# Patient Record
Sex: Male | Born: 1945 | Race: Black or African American | Hispanic: No | Smoking: Current some day smoker
Health system: Southern US, Community
[De-identification: ages and names within clinical notes are randomized; demographics above are authoritative.]

## PROBLEM LIST (undated history)

## (undated) DIAGNOSIS — Z720 Tobacco use: Secondary | ICD-10-CM

## (undated) DIAGNOSIS — Z789 Other specified health status: Secondary | ICD-10-CM

## (undated) DIAGNOSIS — I10 Essential (primary) hypertension: Secondary | ICD-10-CM

## (undated) DIAGNOSIS — C189 Malignant neoplasm of colon, unspecified: Secondary | ICD-10-CM

## (undated) HISTORY — DX: Essential (primary) hypertension: I10

## (undated) HISTORY — DX: Tobacco use: Z72.0

## (undated) SURGERY — COLONOSCOPY WITH PROPOFOL
Anesthesia: Monitor Anesthesia Care | Laterality: Left

---

## 1981-04-02 HISTORY — PX: BACK SURGERY: SHX140

## 2015-05-20 ENCOUNTER — Encounter (HOSPITAL_COMMUNITY): Payer: Self-pay | Admitting: Emergency Medicine

## 2015-05-20 ENCOUNTER — Inpatient Hospital Stay (HOSPITAL_COMMUNITY)
Admission: EM | Admit: 2015-05-20 | Discharge: 2015-05-31 | DRG: 329 | Disposition: A | Discharge status: Home / Self Care | Payer: Medicare Other | Attending: General Surgery | Admitting: General Surgery

## 2015-05-20 ENCOUNTER — Emergency Department (HOSPITAL_COMMUNITY): Payer: Medicare Other

## 2015-05-20 DIAGNOSIS — E860 Dehydration: Secondary | ICD-10-CM | POA: Diagnosis present

## 2015-05-20 DIAGNOSIS — D126 Benign neoplasm of colon, unspecified: Secondary | ICD-10-CM | POA: Diagnosis present

## 2015-05-20 DIAGNOSIS — E43 Unspecified severe protein-calorie malnutrition: Secondary | ICD-10-CM | POA: Diagnosis present

## 2015-05-20 DIAGNOSIS — E876 Hypokalemia: Secondary | ICD-10-CM | POA: Diagnosis present

## 2015-05-20 DIAGNOSIS — R933 Abnormal findings on diagnostic imaging of other parts of digestive tract: Secondary | ICD-10-CM | POA: Diagnosis not present

## 2015-05-20 DIAGNOSIS — D72819 Decreased white blood cell count, unspecified: Secondary | ICD-10-CM | POA: Diagnosis present

## 2015-05-20 DIAGNOSIS — K644 Residual hemorrhoidal skin tags: Secondary | ICD-10-CM | POA: Diagnosis present

## 2015-05-20 DIAGNOSIS — R1030 Lower abdominal pain, unspecified: Secondary | ICD-10-CM

## 2015-05-20 DIAGNOSIS — R112 Nausea with vomiting, unspecified: Secondary | ICD-10-CM | POA: Diagnosis not present

## 2015-05-20 DIAGNOSIS — C182 Malignant neoplasm of ascending colon: Secondary | ICD-10-CM | POA: Diagnosis present

## 2015-05-20 DIAGNOSIS — Z681 Body mass index (BMI) 19 or less, adult: Secondary | ICD-10-CM | POA: Diagnosis not present

## 2015-05-20 DIAGNOSIS — N189 Chronic kidney disease, unspecified: Secondary | ICD-10-CM | POA: Diagnosis present

## 2015-05-20 DIAGNOSIS — K5660 Unspecified intestinal obstruction: Secondary | ICD-10-CM | POA: Diagnosis present

## 2015-05-20 DIAGNOSIS — K56609 Unspecified intestinal obstruction, unspecified as to partial versus complete obstruction: Secondary | ICD-10-CM

## 2015-05-20 DIAGNOSIS — C189 Malignant neoplasm of colon, unspecified: Secondary | ICD-10-CM

## 2015-05-20 DIAGNOSIS — N179 Acute kidney failure, unspecified: Secondary | ICD-10-CM | POA: Diagnosis present

## 2015-05-20 DIAGNOSIS — R1084 Generalized abdominal pain: Secondary | ICD-10-CM | POA: Diagnosis not present

## 2015-05-20 DIAGNOSIS — K6389 Other specified diseases of intestine: Secondary | ICD-10-CM | POA: Diagnosis present

## 2015-05-20 DIAGNOSIS — F17210 Nicotine dependence, cigarettes, uncomplicated: Secondary | ICD-10-CM | POA: Diagnosis present

## 2015-05-20 DIAGNOSIS — M109 Gout, unspecified: Secondary | ICD-10-CM | POA: Diagnosis present

## 2015-05-20 DIAGNOSIS — F17200 Nicotine dependence, unspecified, uncomplicated: Secondary | ICD-10-CM | POA: Diagnosis not present

## 2015-05-20 DIAGNOSIS — J9811 Atelectasis: Secondary | ICD-10-CM | POA: Diagnosis not present

## 2015-05-20 DIAGNOSIS — K635 Polyp of colon: Secondary | ICD-10-CM | POA: Diagnosis not present

## 2015-05-20 DIAGNOSIS — E46 Unspecified protein-calorie malnutrition: Secondary | ICD-10-CM | POA: Diagnosis not present

## 2015-05-20 DIAGNOSIS — K623 Rectal prolapse: Secondary | ICD-10-CM | POA: Diagnosis present

## 2015-05-20 DIAGNOSIS — D490 Neoplasm of unspecified behavior of digestive system: Secondary | ICD-10-CM

## 2015-05-20 DIAGNOSIS — K5669 Other intestinal obstruction: Secondary | ICD-10-CM | POA: Diagnosis not present

## 2015-05-20 HISTORY — DX: Other specified health status: Z78.9

## 2015-05-20 LAB — COMPREHENSIVE METABOLIC PANEL
ALT: 9 U/L — ABNORMAL LOW (ref 17–63)
AST: 26 U/L (ref 15–41)
Albumin: 4 g/dL (ref 3.5–5.0)
Alkaline Phosphatase: 62 U/L (ref 38–126)
Anion gap: 12 (ref 5–15)
BUN: 19 mg/dL (ref 6–20)
CO2: 30 mmol/L (ref 22–32)
Calcium: 9.8 mg/dL (ref 8.9–10.3)
Chloride: 96 mmol/L — ABNORMAL LOW (ref 101–111)
Creatinine, Ser: 1.37 mg/dL — ABNORMAL HIGH (ref 0.61–1.24)
GFR calc Af Amer: >60 mL/min (ref >60)
GFR calc non Af Amer: 52 mL/min — ABNORMAL LOW (ref >60)
Glucose, Bld: 128 mg/dL — ABNORMAL HIGH (ref 65–99)
Potassium: 4.6 mmol/L (ref 3.5–5.1)
Sodium: 138 mmol/L (ref 135–145)
Total Bilirubin: 1 mg/dL (ref 0.3–1.2)
Total Protein: 8.2 g/dL — ABNORMAL HIGH (ref 6.5–8.1)

## 2015-05-20 LAB — URINALYSIS, ROUTINE W REFLEX MICROSCOPIC
BILIRUBIN URINE: NEGATIVE
Glucose, UA: NEGATIVE mg/dL
Hgb urine dipstick: NEGATIVE
Ketones, ur: NEGATIVE mg/dL
Leukocytes, UA: NEGATIVE
NITRITE: NEGATIVE
PH: 5.5 (ref 5.0–8.0)
Protein, ur: NEGATIVE mg/dL
SPECIFIC GRAVITY, URINE: 1.008 (ref 1.005–1.030)

## 2015-05-20 LAB — DIFFERENTIAL
BASOS ABS: 0 10*3/uL (ref 0.0–0.1)
Basophils Relative: 0 %
EOS ABS: 0 10*3/uL (ref 0.0–0.7)
Eosinophils Relative: 1 %
LYMPHS ABS: 1.2 10*3/uL (ref 0.7–4.0)
LYMPHS PCT: 32 %
MONOS PCT: 14 %
Monocytes Absolute: 0.5 10*3/uL (ref 0.1–1.0)
NEUTROS PCT: 53 %
Neutro Abs: 1.9 10*3/uL (ref 1.7–7.7)

## 2015-05-20 LAB — CBC
HEMATOCRIT: 49.1 % (ref 39.0–52.0)
HEMOGLOBIN: 16.8 g/dL (ref 13.0–17.0)
MCH: 30.5 pg (ref 26.0–34.0)
MCHC: 34.2 g/dL (ref 30.0–36.0)
MCV: 89.3 fL (ref 78.0–100.0)
Platelets: 336 10*3/uL (ref 150–400)
RBC: 5.5 MIL/uL (ref 4.22–5.81)
RDW: 12.6 % (ref 11.5–15.5)
WBC: 3.7 10*3/uL — ABNORMAL LOW (ref 4.0–10.5)

## 2015-05-20 LAB — LIPASE, BLOOD: Lipase: 16 U/L (ref 11–51)

## 2015-05-20 MED ORDER — PANTOPRAZOLE SODIUM 40 MG IV SOLR
40.0000 mg | Freq: Every day | INTRAVENOUS | Status: DC
  Administered 2015-05-20 – 2015-05-29 (×10): 40 mg via INTRAVENOUS
  Filled 2015-05-20 (×13): qty 40

## 2015-05-20 MED ORDER — DEXTROSE-NACL 5-0.9 % IV SOLN
INTRAVENOUS | Status: DC
  Administered 2015-05-20 – 2015-05-22 (×6): via INTRAVENOUS

## 2015-05-20 MED ORDER — IOHEXOL 300 MG/ML  SOLN
100.0000 mL | Freq: Once | INTRAMUSCULAR | Status: AC | PRN
  Administered 2015-05-20: 100 mL via INTRAVENOUS

## 2015-05-20 MED ORDER — MORPHINE SULFATE (PF) 2 MG/ML IV SOLN
1.0000 mg | INTRAVENOUS | Status: DC | PRN
  Administered 2015-05-25: 2 mg via INTRAVENOUS
  Filled 2015-05-20: qty 1

## 2015-05-20 MED ORDER — SODIUM CHLORIDE 0.9 % IV BOLUS (SEPSIS)
1000.0000 mL | Freq: Once | INTRAVENOUS | Status: AC
  Administered 2015-05-20: 1000 mL via INTRAVENOUS

## 2015-05-20 MED ORDER — MENTHOL 3 MG MT LOZG
1.0000 | LOZENGE | OROMUCOSAL | Status: DC | PRN
  Filled 2015-05-20: qty 9

## 2015-05-20 MED ORDER — ONDANSETRON HCL 4 MG/2ML IJ SOLN: 4.0000 mg | INTRAMUSCULAR | Status: DC | PRN

## 2015-05-20 MED ORDER — SODIUM CHLORIDE 0.9 % IV SOLN
Freq: Once | INTRAVENOUS | Status: AC
  Administered 2015-05-20: 19:00:00 via INTRAVENOUS

## 2015-05-20 MED ORDER — IOHEXOL 300 MG/ML  SOLN
25.0000 mL | Freq: Once | INTRAMUSCULAR | Status: AC | PRN
  Administered 2015-05-20: 25 mL via ORAL

## 2015-05-20 MED ORDER — ONDANSETRON 4 MG PO TBDP: 4.0000 mg | ORAL_TABLET | Freq: Four times a day (QID) | ORAL | Status: DC | PRN

## 2015-05-20 MED ORDER — ENOXAPARIN SODIUM 40 MG/0.4ML ~~LOC~~ SOLN
40.0000 mg | SUBCUTANEOUS | Status: DC
  Administered 2015-05-20 – 2015-05-30 (×11): 40 mg via SUBCUTANEOUS
  Filled 2015-05-20 (×14): qty 0.4

## 2015-05-20 NOTE — ED Provider Notes (Signed)
CSN: [id number]     Arrival date & time 05/20/15  1351 History   First MD Initiated Contact with Patient 05/20/15 1418     Chief Complaint  Patient presents with  . Abdominal Pain     (Consider location/radiation/quality/duration/timing/severity/associated sxs/prior Treatment) HPI Patient reports abdominal pain for approximately 2 days. He identifies it as gradual in onset. Aching in quality. He denies pain burning or urgency of urination. He denies difficulties urination. He denies difficulty having bowel movement. She reports he has been vomiting today. He denies fever. He denies prior history of abdominal pain or any prior abdominal surgeries. Patient denies any other known medical history. He reports he hasn't gone to the doctor in a long time because he hasn't needed to. History reviewed. No pertinent past medical history. History reviewed. No pertinent past surgical history. No family history on file. Social History  Substance Use Topics  . Smoking status: Current Every Day Smoker  . Smokeless tobacco: None  . Alcohol Use: Yes    Review of Systems  10 Systems reviewed and are negative for acute change except as noted in the HPI.   Allergies  Review of patient's allergies indicates no known allergies.  Home Medications   Prior to Admission medications   Not on File   BP 152/99 mmHg  Pulse 88  Temp(Src) 98.2 F (36.8 C) (Oral)  Resp 16  SpO2 99% Physical Exam  Constitutional: He is oriented to person, place, and time. He appears well-developed and well-nourished.  HENT:  Head: Normocephalic and atraumatic.  Mouth/Throat: Oropharynx is clear and moist.  Eyes: EOM are normal. Pupils are equal, round, and reactive to light.  Neck: Neck supple.  Cardiovascular: Normal rate, regular rhythm, normal heart sounds and intact distal pulses.   Pulmonary/Chest: Effort normal and breath sounds normal.  Abdominal: Soft. Bowel sounds are normal. He exhibits distension. There is  tenderness.  Mild central and lower abdominal discomfort. No gross appreciable mass. Mild distention.  Genitourinary:  Penis and scrotum without swelling or edema. Angle canals are free of mass or fullness. Rectal examination shows anus to have everted appearing internal hemorrhoids. These are nontender. Digital examination shows rectal vault to be empty. Prostate is enlarged. Nontender prostate.  Musculoskeletal: Normal range of motion. He exhibits no edema or tenderness.  Neurological: He is alert and oriented to person, place, and time. He has normal strength. Coordination normal. GCS eye subscore is 4. GCS verbal subscore is 5. GCS motor subscore is 6.  Skin: Skin is warm, dry and intact.  Psychiatric: He has a normal mood and affect.    ED Course  Procedures (including critical care time) Labs Review Labs Reviewed  CBC - Abnormal; Notable for the following:    WBC 3.7 (*)    All other components within normal limits  LIPASE, BLOOD  COMPREHENSIVE METABOLIC PANEL  URINALYSIS, ROUTINE W REFLEX MICROSCOPIC (NOT AT [facility])    Imaging Review No results found. I have personally reviewed and evaluated these images and lab results as part of my medical decision-making.   EKG Interpretation None      MDM   Final diagnoses:  Lower abdominal pain   Patient presents with no known medical history. He is experiencing abdominal pain. Plan will be to obtain CT scan. Pending these results, the patient will be assessed for discharge. If the CT scan does not show any acute findings or findings necessitating admission, I do feel he is safe for discharge. Patient needs to be made  aware of the necessity of follow-up. Although I believe his rectal examination shows hemorrhoids with some prostatic enlargement, these 2 findings will have to be followed up to rule out any evidence of a cancer.    Charlesetta Shanks, MD 05/20/15 1515

## 2015-05-20 NOTE — ED Provider Notes (Signed)
70 year old male who presents with 2 days of abdominal pain, nausea, and vomiting. No known past medical history, but has not been seen by a physician in long period of time. Pending CT abdomen and pelvis at time of sign out. CT visualized and reveals mass involving the ileocecal valve extending into the cecum. There is proximal bowel dilation suggestive of small bowel obstruction due to mass. Discussed with general surgery who will evaluate in the ED and potentially admit for ongoing management. NG tube to be placed for bowel decompression.  Forde Dandy, MD 05/20/15 [phone]

## 2015-05-20 NOTE — ED Notes (Signed)
Patient has urinal at bedside and is aware we need a urine specimen

## 2015-05-20 NOTE — Discharge Instructions (Signed)
Abdominal Pain, Adult Many things can cause abdominal pain. Usually, abdominal pain is not caused by a disease and will improve without treatment. It can often be observed and treated at home. Your health care provider will do a physical exam and possibly order blood tests and X-rays to help determine the seriousness of your pain. However, in many cases, more time must pass before a clear cause of the pain can be found. Before that point, your health care provider may not know if you need more testing or further treatment. HOME CARE INSTRUCTIONS Monitor your abdominal pain for any changes. The following actions may help to alleviate any discomfort you are experiencing:  Only take over-the-counter or prescription medicines as directed by your health care provider.  Do not take laxatives unless directed to do so by your health care provider.  Try a clear liquid diet (broth, tea, or water) as directed by your health care provider. Slowly move to a bland diet as tolerated. SEEK MEDICAL CARE IF:  You have unexplained abdominal pain.  You have abdominal pain associated with nausea or diarrhea.  You have pain when you urinate or have a bowel movement.  You experience abdominal pain that wakes you in the night.  You have abdominal pain that is worsened or improved by eating food.  You have abdominal pain that is worsened with eating fatty foods.  You have a fever. SEEK IMMEDIATE MEDICAL CARE IF:  Your pain does not go away within 2 hours.  You keep throwing up (vomiting).  Your pain is felt only in portions of the abdomen, such as the right side or the left lower portion of the abdomen.  You pass bloody or black tarry stools. MAKE SURE YOU:  Understand these instructions.  Will watch your condition.  Will get help right away if you are not doing well or get worse.   This information is not intended to replace advice given to you by your health care provider. Make sure you discuss  any questions you have with your health care provider.   Document Released: 12/27/2004 Document Revised: 12/08/2014 Document Reviewed: 11/26/2012 Elsevier Interactive Patient Education 2016 [employer]. Colonoscopy A colonoscopy as a part of routine cancer screening. You must schedule an appointment with [place] surgery. Failure to do so may put your risk for undiagnosed colon cancer. A colonoscopy is an exam to look at the entire large intestine (colon). This exam can help find problems such as tumors, polyps, inflammation, and areas of bleeding. The exam takes about 1 hour.  LET [facility] CARE PROVIDER KNOW ABOUT:   Any allergies you have.  All medicines you are taking, including vitamins, herbs, eye drops, creams, and over-the-counter medicines.  Previous problems you or members of your family have had with the use of anesthetics.  Any blood disorders you have.  Previous surgeries you have had.  Medical conditions you have. RISKS AND COMPLICATIONS  Generally, this is a safe procedure. However, as with any procedure, complications can occur. Possible complications include:  Bleeding.  Tearing or rupture of the colon wall.  Reaction to medicines given during the exam.  Infection (rare). BEFORE THE PROCEDURE   Ask your health care provider about changing or stopping your regular medicines.  You may be prescribed an oral bowel prep. This involves drinking a large amount of medicated liquid, starting the day before your procedure. The liquid will cause you to have multiple loose stools until your stool is almost clear or light green.  This cleans out your colon in preparation for the procedure.  Do not eat or drink anything else once you have started the bowel prep, unless your health care provider tells you it is safe to do so.  Arrange for someone to drive you home after the procedure. PROCEDURE   You will be given medicine to help you relax (sedative).  You  will lie on your side with your knees bent.  A long, flexible tube with a light and camera on the end (colonoscope) will be inserted through the rectum and into the colon. The camera sends video back to a computer screen as it moves through the colon. The colonoscope also releases carbon dioxide gas to inflate the colon. This helps your health care provider see the area better.  During the exam, your health care provider may take a small tissue sample (biopsy) to be examined under a microscope if any abnormalities are found.  The exam is finished when the entire colon has been viewed. AFTER THE PROCEDURE   Do not drive for 24 hours after the exam.  You may have a small amount of blood in your stool.  You may pass moderate amounts of gas and have mild abdominal cramping or bloating. This is caused by the gas used to inflate your colon during the exam.  Ask when your test results will be ready and how you will get your results. Make sure you get your test results.   This information is not intended to replace advice given to you by your health care provider. Make sure you discuss any questions you have with your health care provider.   Document Released: 03/16/2000 Document Revised: 01/07/2013 Document Reviewed: 11/24/2012 Elsevier Interactive Patient Education [employer].

## 2015-05-20 NOTE — H&P (Signed)
Christian Black is an 70 y.o. male.   Chief Complaint:  Increasing abdominal pain with nausea and vomiting HPI:  This is a 70 year old male who states he had the abrupt onset of abdominal pain with distention followed by nausea and vomiting. He still having some bowel movements and passing gas. Because of the pain and persistent vomiting he presented to the emergency department. 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 I was asked to see him because of this. He has not seen a physician in many years. He has never had a colonoscopy. He denies any family history of gastrointestinal cancer. He states he has lost some weight. He cannot quantitate the weight loss. He drinks alcohol daily (no more than 2 beers).  Past Medical History  Diagnosis Date  . Medical history non-contributory     Past Surgical History  Procedure Laterality Date  . Back surgery      No family history on file. Social History:  reports that he has been smoking Cigarettes.  He has a 15 pack-year smoking history. He has never used smokeless tobacco. He reports that he drinks alcohol. He reports that he uses illicit drugs (Marijuana).  Allergies: No Known Allergies   Prior to Admission medications   Not on File      (Not in a hospital admission)  Results for orders placed or performed during the hospital encounter of 05/20/15 (from the past 48 hour(s))  Lipase, blood     Status: None   Collection Time: 05/20/15  2:27 PM  Result Value Ref Range   Lipase 16 11 - 51 U/L  Comprehensive metabolic panel     Status: Abnormal   Collection Time: 05/20/15  2:27 PM  Result Value Ref Range   Sodium 138 135 - 145 mmol/L   Potassium 4.6 3.5 - 5.1 mmol/L   Chloride 96 (L) 101 - 111 mmol/L   CO2 30 22 - 32 mmol/L   Glucose, Bld 128 (H) 65 - 99 mg/dL   BUN 19 6 - 20 mg/dL   Creatinine, Ser 1.37 (H) 0.61 - 1.24 mg/dL   Calcium 9.8 8.9  - 10.3 mg/dL   Total Protein 8.2 (H) 6.5 - 8.1 g/dL   Albumin 4.0 3.5 - 5.0 g/dL   AST 26 15 - 41 U/L   ALT 9 (L) 17 - 63 U/L   Alkaline Phosphatase 62 38 - 126 U/L   Total Bilirubin 1.0 0.3 - 1.2 mg/dL   GFR calc non Af Amer 52 (L) >60 mL/min   GFR calc Af Amer >60 >60 mL/min    Comment: (NOTE) The eGFR has been calculated using the CKD EPI equation. This calculation has not been validated in all clinical situations. eGFR's persistently <60 mL/min signify possible Chronic Kidney Disease.    Anion gap 12 5 - 15  CBC     Status: Abnormal   Collection Time: 05/20/15  2:27 PM  Result Value Ref Range   WBC 3.7 (L) 4.0 - 10.5 K/uL   RBC 5.50 4.22 - 5.81 MIL/uL   Hemoglobin 16.8 13.0 - 17.0 g/dL   HCT 49.1 39.0 - 52.0 %   MCV 89.3 78.0 - 100.0 fL   MCH 30.5 26.0 - 34.0 pg   MCHC 34.2 30.0 - 36.0 g/dL   RDW 12.6 11.5 - 15.5 %   Platelets 336 150 - 400 K/uL  Differential  Status: None   Collection Time: 05/20/15  2:27 PM  Result Value Ref Range   Neutrophils Relative % 53 %   Neutro Abs 1.9 1.7 - 7.7 K/uL   Lymphocytes Relative 32 %   Lymphs Abs 1.2 0.7 - 4.0 K/uL   Monocytes Relative 14 %   Monocytes Absolute 0.5 0.1 - 1.0 K/uL   Eosinophils Relative 1 %   Eosinophils Absolute 0.0 0.0 - 0.7 K/uL   Basophils Relative 0 %   Basophils Absolute 0.0 0.0 - 0.1 K/uL  Urinalysis, Routine w reflex microscopic (not at [facility])     Status: Abnormal   Collection Time: 05/20/15  3:55 PM  Result Value Ref Range   Color, Urine YELLOW YELLOW   APPearance CLOUDY (A) CLEAR   Specific Gravity, Urine 1.008 1.005 - 1.030   pH 5.5 5.0 - 8.0   Glucose, UA NEGATIVE NEGATIVE mg/dL   Hgb urine dipstick NEGATIVE NEGATIVE   Bilirubin Urine NEGATIVE NEGATIVE   Ketones, ur NEGATIVE NEGATIVE mg/dL   Protein, ur NEGATIVE NEGATIVE mg/dL   Nitrite NEGATIVE NEGATIVE   Leukocytes, UA NEGATIVE NEGATIVE    Comment: MICROSCOPIC NOT DONE ON URINES WITH NEGATIVE PROTEIN, BLOOD, LEUKOCYTES, NITRITE, OR GLUCOSE  <1000 mg/dL.   Ct Abdomen Pelvis W Contrast  05/20/2015  CLINICAL DATA:  Acute generalized abdominal pain. EXAM: CT ABDOMEN AND PELVIS WITH CONTRAST TECHNIQUE: Multidetector CT imaging of the abdomen and pelvis was performed using the standard protocol following bolus administration of intravenous contrast. CONTRAST:  42m OMNIPAQUE IOHEXOL 300 MG/ML SOLN, 1065mOMNIPAQUE IOHEXOL 300 MG/ML SOLN COMPARISON:  None. FINDINGS: Severe degenerative disc disease is noted at L5-S1. Visualized lung bases are unremarkable. No gallstones are noted. The liver, spleen and pancreas are unremarkable. Adrenal glands appear normal. Bilateral renal cortical scarring is noted. Simple cyst is seen involving the lower pole of left kidney. No hydronephrosis or renal obstruction is noted. The appendix appears normal. Severe small bowel dilatation is noted. This appears to be due to irregular and enhancing abnormality seen involving the ileocecal valve and cecum, which is concerning for possible colonic malignancy. No abnormal fluid collection is noted. Urinary bladder appears normal. No significant adenopathy is noted. Atherosclerosis of abdominal aorta is noted without aneurysm formation. IMPRESSION: There is a irregular and enhancing soft tissue abnormality involving the ileocecal valve and cecum which is resulting and small bowel obstruction. This is highly concerning for colonic malignancy, and colonoscopy is recommended for further evaluation. It is possible that this represents inflammation from inflammatory bowel disease such as Crohn's disease, but that is felt to be less likely. Electronically Signed   By: JaMarijo ConceptionM.D.   On: 05/20/2015 16:53    Review of Systems  Constitutional: Positive for diaphoresis. Negative for fever and chills.  Cardiovascular:       Good exercise tolerance-he states he can walk a mile. He states he can walk up a flight of stairs without getting short of breath.  Gastrointestinal:  Positive for nausea, vomiting and abdominal pain. Negative for blood in stool.  Genitourinary: Negative for hematuria.  Neurological: Negative for dizziness, focal weakness, seizures and weakness.  Endo/Heme/Allergies:       No known bleeding disorders.    Blood pressure 146/88, pulse 71, temperature 98.2 F (36.8 C), temperature source Oral, resp. rate 18, SpO2 100 %. Physical Exam  Constitutional:  Cachectic male in no acute distress.  HENT:  Bilateral temporal wasting is noted.  Eyes: No scleral icterus.  Neck: Neck supple.  Muscular wasting noted.  Cardiovascular: Normal rate and regular rhythm.   Respiratory: Effort normal and breath sounds normal.  GI: Soft. He exhibits distension. He exhibits no mass. There is tenderness (Mild in right lower quadrant area). There is no guarding.  Musculoskeletal: He exhibits no edema.  Neurological: He is alert.  Good motor strength  Skin: Skin is warm and dry.  Psychiatric: He has a normal mood and affect. His behavior is normal.     Assessment/Plan 1. 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  2. Dehydration with mild acute kidney injury.  Plan: Admit to the hospital. IV fluid hydration. Nasogastric tube decompression. Consult gastroenterology for colonoscopy to make sure there are no other lesions in the colon. Will need partial colectomy with distal small bowel resection this admission. He seems to understand this and agrees with the plan.  Odis Hollingshead, MD 05/20/2015, 7:29 PM

## 2015-05-20 NOTE — ED Notes (Signed)
Per pt, states abdominal pain nausea, vomiting-unable to keep down food, liquids

## 2015-05-20 NOTE — ED Notes (Signed)
Lower abdomen is slightly distended, and middle abdomen is taut on palpation.  Pain reported as 4/10 at this time.  No nausea, no acute distress. LBM 05/19/15.

## 2015-05-21 ENCOUNTER — Encounter (HOSPITAL_COMMUNITY): Payer: Self-pay | Admitting: *Deleted

## 2015-05-21 ENCOUNTER — Inpatient Hospital Stay (HOSPITAL_COMMUNITY): Payer: Medicare Other

## 2015-05-21 DIAGNOSIS — N189 Chronic kidney disease, unspecified: Secondary | ICD-10-CM | POA: Diagnosis present

## 2015-05-21 DIAGNOSIS — K6389 Other specified diseases of intestine: Secondary | ICD-10-CM | POA: Diagnosis present

## 2015-05-21 DIAGNOSIS — N179 Acute kidney failure, unspecified: Secondary | ICD-10-CM | POA: Diagnosis present

## 2015-05-21 LAB — CBC
HCT: 41.6 % (ref 39.0–52.0)
HEMOGLOBIN: 13.6 g/dL (ref 13.0–17.0)
MCH: 29.8 pg (ref 26.0–34.0)
MCHC: 32.7 g/dL (ref 30.0–36.0)
MCV: 91 fL (ref 78.0–100.0)
PLATELETS: 292 10*3/uL (ref 150–400)
RBC: 4.57 MIL/uL (ref 4.22–5.81)
RDW: 13.1 % (ref 11.5–15.5)
WBC: 3.5 10*3/uL — ABNORMAL LOW (ref 4.0–10.5)

## 2015-05-21 LAB — BASIC METABOLIC PANEL
ANION GAP: 7 (ref 5–15)
BUN: 15 mg/dL (ref 6–20)
CHLORIDE: 105 mmol/L (ref 101–111)
CO2: 26 mmol/L (ref 22–32)
Calcium: 8.4 mg/dL — ABNORMAL LOW (ref 8.9–10.3)
Creatinine, Ser: 1.06 mg/dL (ref 0.61–1.24)
GFR calc non Af Amer: >60 mL/min (ref >60)
GLUCOSE: 127 mg/dL — AB (ref 65–99)
Potassium: 3.8 mmol/L (ref 3.5–5.1)
Sodium: 138 mmol/L (ref 135–145)

## 2015-05-21 LAB — PREALBUMIN: PREALBUMIN: 15.6 mg/dL — AB (ref 18–38)

## 2015-05-21 MED ORDER — FLEET ENEMA 7-19 GM/118ML RE ENEM
1.0000 | ENEMA | Freq: Two times a day (BID) | RECTAL | Status: DC
  Administered 2015-05-21: 19:00:00 via RECTAL
  Administered 2015-05-22 – 2015-05-26 (×9): 1 via RECTAL
  Filled 2015-05-21 (×10): qty 1

## 2015-05-21 NOTE — Consult Note (Signed)
Subjective:   HPI  The patient is a 70 year old male who came into the hospital and was admitted because of abdominal pain.a CT scan showed evidence of a tumor at the ileocecal valve area causing a bowel obstruction.The patient has never had a colonoscopy.  Review of Systems No chest pain or shortness of breath  Past Medical History  Diagnosis Date  . Medical history non-contributory    Past Surgical History  Procedure Laterality Date  . Back surgery     Social History   Social History  . Marital Status: Single    Spouse Name: N/A  . Number of Children: N/A  . Years of Education: N/A   Occupational History  . Not on file.   Social History Main Topics  . Smoking status: Current Every Day Smoker -- 0.50 packs/day for 30 years    Types: Cigarettes  . Smokeless tobacco: Never Used  . Alcohol Use: Yes     Comment: 1 can beer per day  . Drug Use: Yes    Special: Marijuana  . Sexual Activity: Not on file   Other Topics Concern  . Not on file   Social History Narrative   family history is not on file.  Current facility-administered medications:  .  dextrose 5 %-0.9 % sodium chloride infusion, , Intravenous, Continuous, Jackolyn Confer, MD, Last Rate: 150 mL/hr at 05/21/15 0934 .  enoxaparin (LOVENOX) injection 40 mg, 40 mg, Subcutaneous, Q24H, Jackolyn Confer, MD, 40 mg at 05/20/15 2252 .  menthol-cetylpyridinium (CEPACOL) lozenge 3 mg, 1 lozenge, Oral, PRN, Jackolyn Confer, MD .  morphine 2 MG/ML injection 1-2 mg, 1-2 mg, Intravenous, Q2H PRN, Jackolyn Confer, MD .  ondansetron (ZOFRAN-ODT) disintegrating tablet 4 mg, 4 mg, Oral, Q6H PRN **OR** ondansetron (ZOFRAN) injection 4 mg, 4 mg, Intravenous, Q4H PRN, Jackolyn Confer, MD .  pantoprazole (PROTONIX) injection 40 mg, 40 mg, Intravenous, QHS, Jackolyn Confer, MD, 40 mg at 05/20/15 2252 .  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 Since he has somebowel obstruction we will have to try to clean him out from below using enemas. We will plan for colonoscopy in a few days.

## 2015-05-21 NOTE — Progress Notes (Signed)
Subjective: Feels better today.  No pain.  Passed a little gas.  Objective: Vital signs in last 24 hours: Temp:  [97.2 F (36.2 C)-101.1 F (38.4 C)] 97.2 F (36.2 C) (02/18 0634) Pulse Rate:  [61-88] 72 (02/18 0604) Resp:  [16-18] 16 (02/18 0604) BP: (133-177)/(69-100) 133/69 mmHg (02/18 0604) SpO2:  [97 %-100 %] 98 % (02/18 0604) Weight:  [72.213 kg (159 lb 3.2 oz)] 72.213 kg (159 lb 3.2 oz) (02/17 2124) Last BM Date: 05/19/15  Intake/Output from previous day: 02/17 0701 - 02/18 0700 In: [id number] [P.O.:520; I.V.:1250; NG/GT:40] Out: 995 [Urine:550; Emesis/NG output:195; Drains:250] Intake/Output this shift: Total I/O In: 30 [NG/GT:30] Out: -   PE: General- In NAD Abdomen-soft, no tenderness, some distension, hypoactive bowel sounds  Lab Results:   Recent Labs  05/20/15 1427 05/21/15 0442  WBC 3.7* 3.5*  HGB 16.8 13.6  HCT 49.1 41.6  PLT 336 292   BMET  Recent Labs  05/20/15 1427 05/21/15 0442  NA 138 138  K 4.6 3.8  CL 96* 105  CO2 30 26  GLUCOSE 128* 127*  BUN 19 15  CREATININE 1.37* 1.06  CALCIUM 9.8 8.4*   PT/INR No results for input(s): LABPROT, INR in the last 72 hours. Comprehensive Metabolic Panel:    Component Value Date/Time   NA 138 05/21/2015 0442   NA 138 05/20/2015 1427   K 3.8 05/21/2015 0442   K 4.6 05/20/2015 1427   CL 105 05/21/2015 0442   CL 96* 05/20/2015 1427   CO2 26 05/21/2015 0442   CO2 30 05/20/2015 1427   BUN 15 05/21/2015 0442   BUN 19 05/20/2015 1427   CREATININE 1.06 05/21/2015 0442   CREATININE 1.37* 05/20/2015 1427   GLUCOSE 127* 05/21/2015 0442   GLUCOSE 128* 05/20/2015 1427   CALCIUM 8.4* 05/21/2015 0442   CALCIUM 9.8 05/20/2015 1427   AST 26 05/20/2015 1427   ALT 9* 05/20/2015 1427   ALKPHOS 62 05/20/2015 1427   BILITOT 1.0 05/20/2015 1427   PROT 8.2* 05/20/2015 1427   ALBUMIN 4.0 05/20/2015 1427     Studies/Results: Ct Abdomen Pelvis W Contrast  05/20/2015  CLINICAL DATA:  Acute generalized  abdominal pain. EXAM: CT ABDOMEN AND PELVIS WITH CONTRAST TECHNIQUE: Multidetector CT imaging of the abdomen and pelvis was performed using the standard protocol following bolus administration of intravenous contrast. CONTRAST:  49mL OMNIPAQUE IOHEXOL 300 MG/ML SOLN, 166mL OMNIPAQUE IOHEXOL 300 MG/ML SOLN COMPARISON:  None. FINDINGS: Severe degenerative disc disease is noted at L5-S1. Visualized lung bases are unremarkable. No gallstones are noted. The liver, spleen and pancreas are unremarkable. Adrenal glands appear normal. Bilateral renal cortical scarring is noted. Simple cyst is seen involving the lower pole of left kidney. No hydronephrosis or renal obstruction is noted. The appendix appears normal. Severe small bowel dilatation is noted. This appears to be due to irregular and enhancing abnormality seen involving the ileocecal valve and cecum, which is concerning for possible colonic malignancy. No abnormal fluid collection is noted. Urinary bladder appears normal. No significant adenopathy is noted. Atherosclerosis of abdominal aorta is noted without aneurysm formation. IMPRESSION: There is a irregular and enhancing soft tissue abnormality involving the ileocecal valve and cecum which is resulting and small bowel obstruction. This is highly concerning for colonic malignancy, and colonoscopy is recommended for further evaluation. It is possible that this represents inflammation from inflammatory bowel disease such as Crohn's disease, but that is felt to be less likely. Electronically Signed   By: Marijo Conception,  M.D.   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  GI consultation today.   Odis Hollingshead 05/21/2015

## 2015-05-21 NOTE — Progress Notes (Signed)
Utilization review completed.  

## 2015-05-22 ENCOUNTER — Inpatient Hospital Stay (HOSPITAL_COMMUNITY): Payer: Medicare Other

## 2015-05-22 LAB — CBC
HCT: 36.6 % — ABNORMAL LOW (ref 39.0–52.0)
HEMOGLOBIN: 12.1 g/dL — AB (ref 13.0–17.0)
MCH: 29.8 pg (ref 26.0–34.0)
MCHC: 33.1 g/dL (ref 30.0–36.0)
MCV: 90.1 fL (ref 78.0–100.0)
PLATELETS: 224 10*3/uL (ref 150–400)
RBC: 4.06 MIL/uL — AB (ref 4.22–5.81)
RDW: 12.8 % (ref 11.5–15.5)
WBC: 4.8 10*3/uL (ref 4.0–10.5)

## 2015-05-22 LAB — BASIC METABOLIC PANEL
ANION GAP: 6 (ref 5–15)
BUN: 8 mg/dL (ref 6–20)
CALCIUM: 8.1 mg/dL — AB (ref 8.9–10.3)
CHLORIDE: 107 mmol/L (ref 101–111)
CO2: 26 mmol/L (ref 22–32)
Creatinine, Ser: 0.98 mg/dL (ref 0.61–1.24)
GFR calc non Af Amer: >60 mL/min (ref >60)
Glucose, Bld: 114 mg/dL — ABNORMAL HIGH (ref 65–99)
POTASSIUM: 3.3 mmol/L — AB (ref 3.5–5.1)
SODIUM: 139 mmol/L (ref 135–145)

## 2015-05-22 MED ORDER — KCL IN DEXTROSE-NACL 20-5-0.9 MEQ/L-%-% IV SOLN
INTRAVENOUS | Status: AC
  Administered 2015-05-22 – 2015-05-23 (×5): via INTRAVENOUS
  Filled 2015-05-22 (×5): qty 1000

## 2015-05-22 MED ORDER — POTASSIUM CHLORIDE 10 MEQ/100ML IV SOLN
10.0000 meq | INTRAVENOUS | Status: AC
  Administered 2015-05-22 (×3): 10 meq via INTRAVENOUS
  Filled 2015-05-22 (×3): qty 100

## 2015-05-22 NOTE — Progress Notes (Signed)
Gradual response to enemas. We will continue enemas to try to cleanout his colon from below. Hopefully we can accomplish colonoscopy in a couple of days.

## 2015-05-22 NOTE — Progress Notes (Signed)
Subjective: Feels a little less bloated.  Bowels moved after enemas.  Objective: Vital signs in last 24 hours: Temp:  [98.8 F (37.1 C)-99.2 F (37.3 C)] 99.1 F (37.3 C) (02/19 0425) Pulse Rate:  [55-68] 55 (02/19 0425) Resp:  [14-18] 14 (02/19 0425) BP: (139-165)/(54-88) 165/68 mmHg (02/19 0425) SpO2:  [96 %-100 %] 100 % (02/19 0425) Last BM Date: 05/21/15  Intake/Output from previous day: 02/18 0701 - 02/19 0700 In: 1950 [P.O.:90; I.V.:1800; NG/GT:60] Out: 1660 [Urine:1375; Emesis/NG output:285] Intake/Output this shift:    PE: General- In NAD Abdomen-soft, no tenderness, some distension, hypoactive bowel sounds  Lab Results:   Recent Labs  05/21/15 0442 05/22/15 0509  WBC 3.5* 4.8  HGB 13.6 12.1*  HCT 41.6 36.6*  PLT 292 224   BMET  Recent Labs  05/21/15 0442 05/22/15 0509  NA 138 139  K 3.8 3.3*  CL 105 107  CO2 26 26  GLUCOSE 127* 114*  BUN 15 8  CREATININE 1.06 0.98  CALCIUM 8.4* 8.1*   PT/INR No results for input(s): LABPROT, INR in the last 72 hours. Comprehensive Metabolic Panel:    Component Value Date/Time   NA 139 05/22/2015 0509   NA 138 05/21/2015 0442   K 3.3* 05/22/2015 0509   K 3.8 05/21/2015 0442   CL 107 05/22/2015 0509   CL 105 05/21/2015 0442   CO2 26 05/22/2015 0509   CO2 26 05/21/2015 0442   BUN 8 05/22/2015 0509   BUN 15 05/21/2015 0442   CREATININE 0.98 05/22/2015 0509   CREATININE 1.06 05/21/2015 0442   GLUCOSE 114* 05/22/2015 0509   GLUCOSE 127* 05/21/2015 0442   CALCIUM 8.1* 05/22/2015 0509   CALCIUM 8.4* 05/21/2015 0442   AST 26 05/20/2015 1427   ALT 9* 05/20/2015 1427   ALKPHOS 62 05/20/2015 1427   BILITOT 1.0 05/20/2015 1427   PROT 8.2* 05/20/2015 1427   ALBUMIN 4.0 05/20/2015 1427     Studies/Results: Dg Chest 2 View  05/21/2015  CLINICAL DATA:  Abdominal distension and n/v since yesterday; no specific chest complaints today; f/u colon tumor per ordering MD; no known cardiopulmonary problems;  smoker EXAM: CHEST  2 VIEW COMPARISON:  None. FINDINGS: Lungs are hyperexpanded but clear. No pleural effusion or pneumothorax. Cardiac silhouette is normal in size. No mediastinal or hilar masses or evidence of adenopathy. Oral/nasogastric to has its tip projecting chest through the GE junction. Recommend further insertion of at least 10 cm for more optimal positioning. There old left-sided rib fractures. IMPRESSION: 1. No acute cardiopulmonary disease. 2. Tip of the oral/nasogastric tube chest passes through the GE junction. Recommend further insertion. Electronically Signed   By: Lajean Manes M.D.   On: 05/21/2015 09:14   Ct Abdomen Pelvis W Contrast  05/20/2015  CLINICAL DATA:  Acute generalized abdominal pain. EXAM: CT ABDOMEN AND PELVIS WITH CONTRAST TECHNIQUE: Multidetector CT imaging of the abdomen and pelvis was performed using the standard protocol following bolus administration of intravenous contrast. CONTRAST:  82mL OMNIPAQUE IOHEXOL 300 MG/ML SOLN, 17mL OMNIPAQUE IOHEXOL 300 MG/ML SOLN COMPARISON:  None. FINDINGS: Severe degenerative disc disease is noted at L5-S1. Visualized lung bases are unremarkable. No gallstones are noted. The liver, spleen and pancreas are unremarkable. Adrenal glands appear normal. Bilateral renal cortical scarring is noted. Simple cyst is seen involving the lower pole of left kidney. No hydronephrosis or renal obstruction is noted. The appendix appears normal. Severe small bowel dilatation is noted. This appears to be due to irregular and enhancing abnormality  seen involving the ileocecal valve and cecum, which is concerning for possible colonic malignancy. No abnormal fluid collection is noted. Urinary bladder appears normal. No significant adenopathy is noted. Atherosclerosis of abdominal aorta is noted without aneurysm formation. IMPRESSION: There is a irregular and enhancing soft tissue abnormality involving the ileocecal valve and cecum which is resulting and small  bowel obstruction. This is highly concerning for colonic malignancy, and colonoscopy is recommended for further evaluation. It is possible that this represents inflammation from inflammatory bowel disease such as Crohn's disease, but that is felt to be less likely. Electronically Signed   By: Marijo Conception, M.D.   On: 05/20/2015 16:53   Dg Abd Portable 2v  05/21/2015  CLINICAL DATA:  70 year old male with abdominal distention, nausea and vomiting EXAM: PORTABLE ABDOMEN - 2 VIEW COMPARISON:  CT abdomen/ pelvis 05/20/2015 FINDINGS: A nasogastric tube is present. The tip overlies the gastric bubble in the proximal side hole the gastroesophageal junction. Multiple loops of dilated and air filled small bowel in the mid and left abdomen consistent with distal small bowel obstruction. No evidence of free air. Moderate volume of formed stool overlies the rectum. No acute osseous abnormality. 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 IMPRESSION: 1. Persistent small bowel obstruction. Maximal small bowel diameter 4.5 cm. 2. The tip of the nasogastric tube overlies the gastric bubble while the proximal side hole overlies the gastroesophageal junction. Consider advancing a a few cm for more optimal position of the proximal side hole within the stomach. Electronically Signed   By: Jacqulynn Cadet M.D.   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  Continue bowel prep from below per GI in preparation for colonoscopy.   Christian Black 05/22/2015

## 2015-05-23 LAB — HEMOGLOBIN A1C
HEMOGLOBIN A1C: 5.7 % — AB (ref 4.8–5.6)
MEAN PLASMA GLUCOSE: 117 mg/dL

## 2015-05-23 MED ORDER — POTASSIUM CHLORIDE 10 MEQ/100ML IV SOLN
10.0000 meq | INTRAVENOUS | Status: AC
  Administered 2015-05-23 (×2): 10 meq via INTRAVENOUS
  Filled 2015-05-23 (×2): qty 100

## 2015-05-23 MED ORDER — SODIUM CHLORIDE 0.9% FLUSH
10.0000 mL | INTRAVENOUS | Status: DC | PRN
  Administered 2015-05-26: 10 mL
  Filled 2015-05-23: qty 40

## 2015-05-23 MED ORDER — FAT EMULSION 20 % IV EMUL
240.0000 mL | INTRAVENOUS | Status: AC
  Administered 2015-05-23: 240 mL via INTRAVENOUS
  Filled 2015-05-23: qty 250

## 2015-05-23 MED ORDER — INSULIN ASPART 100 UNIT/ML ~~LOC~~ SOLN
0.0000 [IU] | Freq: Four times a day (QID) | SUBCUTANEOUS | Status: DC
  Administered 2015-05-24 – 2015-05-28 (×9): 2 [IU] via SUBCUTANEOUS
  Administered 2015-05-28: 3 [IU] via SUBCUTANEOUS
  Administered 2015-05-29 – 2015-05-30 (×4): 2 [IU] via SUBCUTANEOUS

## 2015-05-23 MED ORDER — KCL IN DEXTROSE-NACL 20-5-0.9 MEQ/L-%-% IV SOLN
INTRAVENOUS | Status: AC
  Administered 2015-05-23 (×2): via INTRAVENOUS
  Filled 2015-05-23 (×5): qty 1000

## 2015-05-23 MED ORDER — TRACE MINERALS CR-CU-MN-SE-ZN 10-1000-500-60 MCG/ML IV SOLN
INTRAVENOUS | Status: AC
  Administered 2015-05-23: 17:00:00 via INTRAVENOUS
  Filled 2015-05-23: qty 960

## 2015-05-23 NOTE — Care Management Important Message (Signed)
Important Message  Patient Details  Name: Christian Black MRN: [id number] Date of Birth: January 26, 1946   Medicare Important Message Given:  Yes    [name] 05/23/2015, 3:51 [name]  Patient Details  Name: Christian Black MRN: [id number] Date of Birth: [birth year]   Medicare Important Message Given:  Yes    [name] 05/23/2015, 3:50 PM

## 2015-05-23 NOTE — Progress Notes (Signed)
Peripherally Inserted Central Catheter/Midline Placement  The IV Nurse has discussed with the patient and/or persons authorized to consent for the patient, the purpose of this procedure and the potential benefits and risks involved with this procedure.  The benefits include less needle sticks, lab draws from the catheter and patient may be discharged home with the catheter.  Risks include, but not limited to, infection, bleeding, blood clot (thrombus formation), and puncture of an artery; nerve damage and irregular heat beat.  Alternatives to this procedure were also discussed.  PICC/Midline Placement Documentation        Darlyn Read 05/23/2015, 2:20 PM

## 2015-05-23 NOTE — Progress Notes (Signed)
Patient ID: Christian Black, male   DOB: 08-23-1945, 70 y.o.   MRN: [id number]     [facility]      [address] [place], [place] [address]    Phone: [phone] FAX: [phone]     Subjective: Washing up.  Loose BMs. No n/v.  263m NGT output, clear. Vss.  Afebrile.  k 3.4 today.   Objective:  Vital signs:  Filed Vitals:   05/22/15 0425 05/22/15 1437 05/22/15 2144 05/23/15 0435  BP: 165/68 155/87 152/84 126/87  Pulse: 55 64 65 60  Temp: 99.1 F (37.3 C) 98.5 F (36.9 C) 99.1 F (37.3 C) 99.2 F (37.3 C)  TempSrc: Oral Oral Oral Oral  Resp: _0 Height:      Weight:      SpO2: 100% 98% 99% 97%    Last BM Date: 05/22/15 (enema)  Intake/Output   Yesterday:  02/19 0701 - 02/20 0700 In: 2552.1 [P.O.:175; I.V.:2377.1] Out: 2550 [Urine:2350; Emesis/NG output:200] This shift:     Physical Exam: General: Pt awake/alert/oriented x4 in no acute distress Chest: cta.  No chest wall pain w good excursion CV:  Pulses intact.  Regular rhythm MS: Normal AROM mjr joints.  No obvious deformity Abdomen: Soft.  Nondistended.  Non tender.  No evidence of peritonitis.  No incarcerated hernias. Ext:   No mjr edema.  No cyanosis Skin: No petechiae / purpura   Problem List:   Principal Problem:   SBO (small bowel obstruction) (HCC) Active Problems:   Colonic mass   Acute kidney injury ([facility]    Results:   Labs: Results for orders placed or performed during the hospital encounter of 05/20/15 (from the past 48 hour(s))  CBC     Status: Abnormal   Collection Time: 05/22/15  5:09 AM  Result Value Ref Range   WBC 4.8 4.0 - 10.5 K/uL   RBC 4.06 (L) 4.22 - 5.81 MIL/uL   Hemoglobin 12.1 (L) 13.0 - 17.0 g/dL   HCT 36.6 (L) 39.0 - 52.0 %   MCV 90.1 78.0 - 100.0 fL   MCH 29.8 26.0 - 34.0 pg   MCHC 33.1 30.0 - 36.0 g/dL   RDW 12.8 11.5 - 15.5 %   Platelets 224 150 - 400 K/uL  Basic metabolic panel     Status: Abnormal    Collection Time: 05/22/15  5:09 AM  Result Value Ref Range   Sodium 139 135 - 145 mmol/L   Potassium 3.3 (L) 3.5 - 5.1 mmol/L   Chloride 107 101 - 111 mmol/L   CO2 26 22 - 32 mmol/L   Glucose, Bld 114 (H) 65 - 99 mg/dL   BUN 8 6 - 20 mg/dL   Creatinine, Ser 0.98 0.61 - 1.24 mg/dL   Calcium 8.1 (L) 8.9 - 10.3 mg/dL   GFR calc non Af Amer >60 >60 mL/min   GFR calc Af Amer >60 >60 mL/min    Comment: (NOTE) The eGFR has been calculated using the CKD EPI equation. This calculation has not been validated in all clinical situations. eGFR's persistently <60 mL/min signify possible Chronic Kidney Disease.    Anion gap 6 5 - 15    Imaging / Studies: Dg Abd 2 Views  05/22/2015  CLINICAL DATA:  ADMITTED TO HOSPITAL YESTERDAY FOR ABD PAIN X 1 WK, NG TUBE AND ENEMA TREATMENT LAST NIGHT, PT DENIES ABD PAIN, C/O WEAKNESS EXAM: ABDOMEN - 2 VIEW COMPARISON:  05/21/2015 FINDINGS:  Nasogastric tube is in place, side port overlying the level of the gastroesophageal junction. There is dilatation of small bowel loops, associated with air-fluid levels on the erect view. Contrast is identified within nondilated loops of colon. Colonic diverticula are present. IMPRESSION: Persistent small bowel obstruction. Electronically Signed   By: Nolon Nations M.D.   On: 05/22/2015 10:40    Medications / Allergies:  Scheduled Meds: . enoxaparin (LOVENOX) injection  40 mg Subcutaneous Q24H  . pantoprazole (PROTONIX) IV  40 mg Intravenous QHS  . potassium chloride  10 mEq Intravenous Q1 Hr x 2  . sodium phosphate  1 enema Rectal BID   Continuous Infusions: . dextrose 5 % and 0.9 % NaCl with KCl 20 mEq/L 125 mL/hr at 05/23/15 0542   PRN Meds:.menthol-cetylpyridinium, morphine injection, ondansetron **OR** ondansetron (ZOFRAN) IV  Antibiotics: Anti-infectives    None        Assessment/Plan SBO 2/2 cecal mass-continue NGT decompression, GI following for coloscopy. Tentative plans Wednesday or Thursday.   Then surgery VTE prophylaxis-SCD/lovenox PCM-prealbumin 15.6.  Colonoscopy wed-thurs, then surgery.  Given prolonged NPO, start TPN FEN-IVF.  Hypokalemia-51mq k today, AM labs.    EErby Pian A[facility]icsSurgery Pager [phone]([id number])   05/23/2015 9:46 AM

## 2015-05-23 NOTE — Progress Notes (Signed)
PARENTERAL NUTRITION CONSULT NOTE - INITIAL  Pharmacy Consult for TPN Indication: SBO  No Known Allergies  Patient Measurements: Height: 6\' 4"  (193 cm) Weight: 159 lb 3.2 oz (72.213 kg) IBW/kg (Calculated) : 86.8   Vital Signs: Temp: 99.2 F (37.3 C) (02/20 0435) Temp Source: Oral (02/20 0435) BP: 126/87 mmHg (02/20 0435) Pulse Rate: 60 (02/20 0435) Intake/Output from previous day: 02/19 0701 - 02/20 0700 In: 2552.1 [P.O.:175; I.V.:2377.1] Out: 2550 [Urine:2350; Emesis/NG output:200] Intake/Output from this shift:    Labs:  Recent Labs  05/20/15 1427 05/21/15 0442 05/22/15 0509  WBC 3.7* 3.5* 4.8  HGB 16.8 13.6 12.1*  HCT 49.1 41.6 36.6*  PLT 336 292 224     Recent Labs  05/20/15 1427 05/21/15 0442 05/22/15 0509  NA 138 138 139  K 4.6 3.8 3.3*  CL 96* 105 107  CO2 30 26 26   GLUCOSE 128* 127* 114*  BUN 19 15 8   CREATININE 1.37* 1.06 0.98  CALCIUM 9.8 8.4* 8.1*  PROT 8.2*  --   --   ALBUMIN 4.0  --   --   AST 26  --   --   ALT 9*  --   --   ALKPHOS 62  --   --   BILITOT 1.0  --   --   PREALBUMIN  --  15.6*  --    Estimated Creatinine Clearance: 74.7 mL/min (by C-G formula based on Cr of 0.98).   No results for input(s): GLUCAP in the last 72 hours.  Medical History: Past Medical History  Diagnosis Date  . 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  Significant events:   Today:    Glucose - WNL  Electrolytes - 2/19 all WNL, except K slightly low  Renal - WNL  LFTs - WNL (2/17)  TGs -  ordered for tomorrow  Prealbumin - 15.6 (2/18)  NUTRITIONAL GOALS                                                                                             RD recs: pending Clinimix E 5 15/ at a goal rate of 72ml/hr + 20% fat emulsion  at 31ml/hr to provide: ~100g/day protein, 1912 Kcal/day.  PLAN                                                                                                                         At 1800 today:  Start Clinimix E 5/15 at 47ml/hr.  20% fat emulsion at 40ml/hr.  Plan to advance as tolerated to the goal rate.  TPN to contain standard multivitamins and trace elements.  Reduce IVF to 83ml/hr.  Add moderate SSI q6h starting at midnight  TPN labs tomorrow am  TPN lab panels on Mondays & Thursdays.  F/u daily.  Dolly Rias RPh 05/23/2015, 10:22 AM Pager [phone]

## 2015-05-23 NOTE — Progress Notes (Signed)
Subjective: "I feel much better." Still having semi-formed stools per enemas (administered bid).  Objective: Vital signs in last 24 hours: Temp:  [98.5 F (36.9 C)-99.2 F (37.3 C)] 99.2 F (37.3 C) (02/20 0435) Pulse Rate:  [60-65] 60 (02/20 0435) Resp:  [15-16] 15 (02/20 0435) BP: (126-155)/(84-87) 126/87 mmHg (02/20 0435) SpO2:  [97 %-99 %] 97 % (02/20 0435) Weight change:  Last BM Date: 05/22/15 (enema)  PE: GEN:  Cachectic-appearing HEENT:  NGT in place ABD:  Mild distention with tympany, non tender, hypoactive bowel sounds  Lab Results: CBC    Component Value Date/Time   WBC 4.8 05/22/2015 0509   RBC 4.06* 05/22/2015 0509   HGB 12.1* 05/22/2015 0509   HCT 36.6* 05/22/2015 0509   PLT 224 05/22/2015 0509   MCV 90.1 05/22/2015 0509   MCH 29.8 05/22/2015 0509   MCHC 33.1 05/22/2015 0509   RDW 12.8 05/22/2015 0509   LYMPHSABS 1.2 05/20/2015 1427   MONOABS 0.5 05/20/2015 1427   EOSABS 0.0 05/20/2015 1427   BASOSABS 0.0 05/20/2015 1427   CMP     Component Value Date/Time   NA 139 05/22/2015 0509   K 3.3* 05/22/2015 0509   CL 107 05/22/2015 0509   CO2 26 05/22/2015 0509   GLUCOSE 114* 05/22/2015 0509   BUN 8 05/22/2015 0509   CREATININE 0.98 05/22/2015 0509   CALCIUM 8.1* 05/22/2015 0509   PROT 8.2* 05/20/2015 1427   ALBUMIN 4.0 05/20/2015 1427   AST 26 05/20/2015 1427   ALT 9* 05/20/2015 1427   ALKPHOS 62 05/20/2015 1427   BILITOT 1.0 05/20/2015 1427   GFRNONAA >60 05/22/2015 0509   GFRAA >60 05/22/2015 0509   Assessment:  1.  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 2.  Abnormal CT scan abdomen/pelvis, ICV lesion, with upstream small bowel obstruction.  Plan:  1.  Continued nasogastric tube suctioning and NPO status. 2.  Continue bid enemas until clear. 3.  Can not prep colon orally due to obstruction. 4.  Anticipate it will be a couple more days at least before colonoscopy can be done, and even then, proximal colonic prep is likely to be  suboptimal. 5.  Consideration of parenteral nutrition per surgical team. 6.  [facility] will follow.   Landry Dyke 05/23/2015, 10:45 AM   Pager [phone] If no answer or after 5 PM call [phone]

## 2015-05-24 LAB — CBC
HEMATOCRIT: 37.8 % — AB (ref 39.0–52.0)
HEMOGLOBIN: 12.3 g/dL — AB (ref 13.0–17.0)
MCH: 29.6 pg (ref 26.0–34.0)
MCHC: 32.5 g/dL (ref 30.0–36.0)
MCV: 91.1 fL (ref 78.0–100.0)
Platelets: 235 10*3/uL (ref 150–400)
RBC: 4.15 MIL/uL — ABNORMAL LOW (ref 4.22–5.81)
RDW: 12.7 % (ref 11.5–15.5)
WBC: 6.5 10*3/uL (ref 4.0–10.5)

## 2015-05-24 LAB — DIFFERENTIAL
BASOS ABS: 0 10*3/uL (ref 0.0–0.1)
BASOS PCT: 0 %
Eosinophils Absolute: 0.1 10*3/uL (ref 0.0–0.7)
Eosinophils Relative: 1 %
Lymphocytes Relative: 18 %
Lymphs Abs: 1.1 10*3/uL (ref 0.7–4.0)
MONOS PCT: 8 %
Monocytes Absolute: 0.5 10*3/uL (ref 0.1–1.0)
NEUTROS ABS: 4.8 10*3/uL (ref 1.7–7.7)
NEUTROS PCT: 73 %

## 2015-05-24 LAB — GLUCOSE, CAPILLARY
GLUCOSE-CAPILLARY: 120 mg/dL — AB (ref 65–99)
GLUCOSE-CAPILLARY: 108 mg/dL — AB (ref 65–99)
GLUCOSE-CAPILLARY: 107 mg/dL — AB (ref 65–99)
Glucose-Capillary: 100 mg/dL — ABNORMAL HIGH (ref 65–99)
Glucose-Capillary: 101 mg/dL — ABNORMAL HIGH (ref 65–99)
Glucose-Capillary: 125 mg/dL — ABNORMAL HIGH (ref 65–99)

## 2015-05-24 LAB — COMPREHENSIVE METABOLIC PANEL
ALBUMIN: 3.1 g/dL — AB (ref 3.5–5.0)
ALT: 6 U/L — ABNORMAL LOW (ref 17–63)
AST: 15 U/L (ref 15–41)
Alkaline Phosphatase: 42 U/L (ref 38–126)
Anion gap: 6 (ref 5–15)
BUN: 7 mg/dL (ref 6–20)
CHLORIDE: 107 mmol/L (ref 101–111)
CO2: 26 mmol/L (ref 22–32)
Calcium: 8.8 mg/dL — ABNORMAL LOW (ref 8.9–10.3)
Creatinine, Ser: 0.94 mg/dL (ref 0.61–1.24)
GFR calc Af Amer: >60 mL/min (ref >60)
GLUCOSE: 128 mg/dL — AB (ref 65–99)
POTASSIUM: 3.6 mmol/L (ref 3.5–5.1)
Sodium: 139 mmol/L (ref 135–145)
Total Bilirubin: 0.9 mg/dL (ref 0.3–1.2)
Total Protein: 6.8 g/dL (ref 6.5–8.1)

## 2015-05-24 LAB — MAGNESIUM: Magnesium: 1.7 mg/dL (ref 1.7–2.4)

## 2015-05-24 LAB — PREALBUMIN: PREALBUMIN: 10.7 mg/dL — AB (ref 18–38)

## 2015-05-24 LAB — PHOSPHORUS: Phosphorus: 3.8 mg/dL (ref 2.5–4.6)

## 2015-05-24 LAB — TRIGLYCERIDES: TRIGLYCERIDES: 53 mg/dL (ref <150)

## 2015-05-24 MED ORDER — POTASSIUM CHLORIDE 10 MEQ/100ML IV SOLN
10.0000 meq | INTRAVENOUS | Status: AC
  Administered 2015-05-24 (×2): 10 meq via INTRAVENOUS
  Filled 2015-05-24 (×2): qty 100

## 2015-05-24 MED ORDER — CHLORHEXIDINE GLUCONATE 0.12 % MT SOLN
15.0000 mL | Freq: Two times a day (BID) | OROMUCOSAL | Status: DC
  Administered 2015-05-25 – 2015-05-30 (×10): 15 mL via OROMUCOSAL
  Filled 2015-05-24 (×16): qty 15

## 2015-05-24 MED ORDER — KCL IN DEXTROSE-NACL 20-5-0.9 MEQ/L-%-% IV SOLN
INTRAVENOUS | Status: AC
  Administered 2015-05-24: 17:00:00 via INTRAVENOUS
  Filled 2015-05-24 (×2): qty 1000

## 2015-05-24 MED ORDER — FAT EMULSION 20 % IV EMUL
240.0000 mL | INTRAVENOUS | Status: AC
  Administered 2015-05-24: 240 mL via INTRAVENOUS
  Filled 2015-05-24: qty 250

## 2015-05-24 MED ORDER — TRACE MINERALS CR-CU-MN-SE-ZN 10-1000-500-60 MCG/ML IV SOLN
INTRAVENOUS | Status: AC
  Administered 2015-05-24: 17:00:00 via INTRAVENOUS
  Filled 2015-05-24: qty 1440

## 2015-05-24 MED ORDER — MAGNESIUM SULFATE IN D5W 10-5 MG/ML-% IV SOLN
1.0000 g | Freq: Once | INTRAVENOUS | Status: AC
  Administered 2015-05-24: 1 g via INTRAVENOUS
  Filled 2015-05-24 (×2): qty 100

## 2015-05-24 NOTE — Progress Notes (Signed)
PARENTERAL NUTRITION CONSULT NOTE - follow up  Pharmacy Consult for TPN Indication: SBO  No Known Allergies  Patient Measurements: Height: 6\' 4"  (193 cm) Weight: 159 lb 3.2 oz (72.213 kg) IBW/kg (Calculated) : 86.8   Vital Signs: Temp: 98.9 F (37.2 C) (02/21 0600) Temp Source: Oral (02/21 0600) BP: 162/68 mmHg (02/21 0600) Pulse Rate: 68 (02/21 0600) Intake/Output from previous day: 02/20 0701 - 02/21 0700 In: 2164.4 [P.O.:75; I.V.:1648.6; TPN:440.8] Out: 1250 [Urine:1200; Emesis/NG output:50] Intake/Output from this shift: Total I/O In: -  Out: 300 [Urine:300]  Labs:  Recent Labs  05/22/15 0509 05/24/15 0420  WBC 4.8 6.5  HGB 12.1* 12.3*  HCT 36.6* 37.8*  PLT 224 235     Recent Labs  05/22/15 0509 05/24/15 0420  NA 139 139  K 3.3* 3.6  CL 107 107  CO2 26 26  GLUCOSE 114* 128*  BUN 8 7  CREATININE 0.98 0.94  CALCIUM 8.1* 8.8*  MG  --  1.7  PHOS  --  3.8  PROT  --  6.8  ALBUMIN  --  3.1*  AST  --  15  ALT  --  6*  ALKPHOS  --  42  BILITOT  --  0.9  PREALBUMIN  --  10.7*  TRIG  --  53   Estimated Creatinine Clearance: 77.9 mL/min (by C-G formula based on Cr of 0.94).    Recent Labs  05/23/15 2027 05/24/15 0005 05/24/15 0433  GLUCAP 120* 125* 108*    Medical History: Past Medical History  Diagnosis Date  . 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  Significant events:   Today:    Glucose - <150  Electrolytes - K and MAg on the low end of normal, CCa WNL, all others WNL  Renal -  WNL  LFTs - WNL   TGs - 53  Prealbumin - 15.6 (2/18), 10.7 (2/21)  NUTRITIONAL GOALS                                                                                             RD recs: pending Clinimix E 5 15/ at a goal rate of 18ml/hr + 20% fat emulsion  at 14ml/hr to provide: ~100g/day protein, 1912 Kcal/day.  PLAN                                                                                                     KCl 6meq IV x2, Mag Sulfate 1gm IV x1                    At 1800 today:  Increase Clinimix E 5/15 to 43ml/hr.  Continue 20% fat emulsion at 26ml/hr.  Plan to advance as tolerated to the goal rate.  TPN to contain standard multivitamins and trace elements.  Reduce IVF to 50ml/hr.  continue moderate SSI q6h   BMet with mag and phos tomorrow am  TPN lab panels on Mondays & Thursdays.  F/u daily.  Dolly Rias RPh 05/24/2015, 9:45 AM Pager [phone]

## 2015-05-24 NOTE — Progress Notes (Signed)
Patient ID: Christian Black, male   DOB: 1946/01/20, 70 y.o.   MRN: [id number]    Subjective: He denies abdominal pain or other complaints. Has had loose bowel movements. TNA has started  Objective: Vital signs in last 24 hours: Temp:  [98.8 F (37.1 C)-99.7 F (37.6 C)] 98.9 F (37.2 C) (02/21 0600) Pulse Rate:  [55-68] 68 (02/21 0600) Resp:  [16-20] 18 (02/21 0600) BP: (151-172)/(65-69) 162/68 mmHg (02/21 0600) SpO2:  [100 %] 100 % (02/21 0600) Last BM Date: 05/23/15  Intake/Output from previous day: 02/20 0701 - 02/21 0700 In: 2164.4 [P.O.:75; I.V.:1648.6; TPN:440.8] Out: 1250 [Urine:1200; Emesis/NG output:50] Intake/Output this shift: Total I/O In: -  Out: 300 [Urine:300]  General appearance: drowsy but easily arousable and conversant, in no distress GI: mild distention, soft and nontender, no palpable masses  Lab Results:   Recent Labs  05/22/15 0509 05/24/15 0420  WBC 4.8 6.5  HGB 12.1* 12.3*  HCT 36.6* 37.8*  PLT 224 235   BMET  Recent Labs  05/22/15 0509 05/24/15 0420  NA 139 139  K 3.3* 3.6  CL 107 107  CO2 26 26  GLUCOSE 114* 128*  BUN 8 7  CREATININE 0.98 0.94  CALCIUM 8.1* 8.8*     Studies/Results: No results found.  Anti-infectives: Anti-infectives    None      Assessment/Plan: Small bowel obstruction with likely cecal mass versus inflammatory change. Abdominal distention improved and prepping successfully. Colonoscopy planned tomorrow. Surgical plans to follow this.    LOS: 4 days    Savilla Turbyfill T 05/24/2015

## 2015-05-24 NOTE — Progress Notes (Signed)
Initial Nutrition Assessment  DOCUMENTATION CODES:   Severe malnutrition in context of acute illness/injury  INTERVENTION:  -TPN per pharmacy  Patient is at risk for refeeding syndrome due to severe muscle and fat depletion, please monitor magnesium, phosphorus, and potassium for 3 days.  -RD to continue to monitor  NUTRITION DIAGNOSIS:   Inadequate oral intake related to inability to eat as evidenced by NPO status.  GOAL:   Patient will meet greater than or equal to 90% of their needs  MONITOR:   Other (Comment), Labs, Weight trends (TPN tolerance)  REASON FOR ASSESSMENT:   Consult New TPN/TNA  ASSESSMENT:   Pt states he had the abrupt onset of abdominal pain with distention followed by nausea and vomiting. He still having some bowel movements and passing gas. Because of the pain and persistent vomiting he presented to the emergency department. 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 I was asked to see him because of this. He has not seen a physician in many years. He has never had a colonoscopy. He denies any family history of gastrointestinal cancer. He states he has lost some weight. He cannot quantitate the weight loss. He drinks alcohol daily (no more than 2 beers).  Pt seen for TPN consult. Pt provided limited history, answering questions with one word responses.  Pt states PTA he was consuming 3 meals daily, "for the most part". Pt does not consume nutritional supplemental drinks such as Ensure at home. Suspect poor PO intake PTA due to nausea/vomiting and daily alcohol intake. Pt with NG tube.   Pt states he has lost a couple of pounds but can not provide an amount nor a time frame. There is no weight history per chart.   Conducted nutrition focused physical exam, identified severe muscle and fat wasting.   Suspect pt is at risk for refeeding syndrome due to severe muscle  and fat wasting. Unable to determine if there is significant weight loss or time frame of poor PO intake.  Plan per pharmacy 2/21;  At 1800 today:  Increase Clinimix E 5/15 to 31ml/hr.  Continue 20% fat emulsion at 55ml/hr.  Plan to advance as tolerated to the goal rate  TPN goal:  Clinimix E 5 15/ at a goal rate of 22ml/hr + 20% fat emulsion at 6ml/hr to provide: ~100g/day protein, 1912 Kcal/day.  Medications reviewed; D5 NS w/ 70meq/KCl @ 67mls/hr . Labs reviewed; electrolytes WNL  Diet Order:  Diet NPO time specified Except for: Ice Chips TPN (CLINIMIX-E) Adult TPN (CLINIMIX-E) Adult  Skin:  Reviewed, no issues  Last BM:  05/23/2015  Height:   Ht Readings from Last 1 Encounters:  05/20/15 6\' 4"  (1.93 m)    Weight:   Wt Readings from Last 1 Encounters:  05/20/15 159 lb 3.2 oz (72.213 kg)    Ideal Body Weight:  91.8 kg  BMI:  Body mass index is 19.39 kg/(m^2).  Estimated Nutritional Needs:   Kcal:  2000-2200  Protein:  90-100 grams   Fluid:  >/= 2 L   EDUCATION NEEDS:   No education needs identified at this time  [place], Dietetic Intern Pager: [phone]

## 2015-05-24 NOTE — Progress Notes (Signed)
Subjective: Stools progressively lighter and more liquid in nature.  Objective: Vital signs in last 24 hours: Temp:  [98.8 F (37.1 C)-99.7 F (37.6 C)] 98.9 F (37.2 C) (02/21 0600) Pulse Rate:  [55-68] 68 (02/21 0600) Resp:  [16-20] 18 (02/21 0600) BP: (151-172)/(65-69) 162/68 mmHg (02/21 0600) SpO2:  [100 %] 100 % (02/21 0600) Weight change:  Last BM Date: 05/23/15  PE: GEN:  NAD HEENT:  NGT in place, poor dentition ABD:  Mild-to-moderate distention (improved slightly from yesterday), hypoactive but active bowel sounds  Lab Results: CBC    Component Value Date/Time   WBC 6.5 05/24/2015 0420   RBC 4.15* 05/24/2015 0420   HGB 12.3* 05/24/2015 0420   HCT 37.8* 05/24/2015 0420   PLT 235 05/24/2015 0420   MCV 91.1 05/24/2015 0420   MCH 29.6 05/24/2015 0420   MCHC 32.5 05/24/2015 0420   RDW 12.7 05/24/2015 0420   LYMPHSABS 1.1 05/24/2015 0420   MONOABS 0.5 05/24/2015 0420   EOSABS 0.1 05/24/2015 0420   BASOSABS 0.0 05/24/2015 0420   CMP     Component Value Date/Time   NA 139 05/24/2015 0420   K 3.6 05/24/2015 0420   CL 107 05/24/2015 0420   CO2 26 05/24/2015 0420   GLUCOSE 128* 05/24/2015 0420   BUN 7 05/24/2015 0420   CREATININE 0.94 05/24/2015 0420   CALCIUM 8.8* 05/24/2015 0420   PROT 6.8 05/24/2015 0420   ALBUMIN 3.1* 05/24/2015 0420   AST 15 05/24/2015 0420   ALT 6* 05/24/2015 0420   ALKPHOS 42 05/24/2015 0420   BILITOT 0.9 05/24/2015 0420   GFRNONAA >60 05/24/2015 0420   GFRAA >60 05/24/2015 0420   Assessment:  1. 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 2. Abnormal CT scan abdomen/pelvis, ICV lesion, with upstream small bowel obstruction.  Plan:  1.  Continue enemas today. 2.  Will try colonoscopy tomorrow.  Hopefully we'll be able to get a good enough view of the proximal colon, where the colonic pathology is located.  If not, there is really no other viable way to get direct non-operative inspection of the Ileocecal valve area. 3.   [facility] will follow.   Landry Dyke 05/24/2015, 9:30 AM   Pager [phone] If no answer or after 5 PM call [phone]

## 2015-05-25 ENCOUNTER — Encounter (HOSPITAL_COMMUNITY): Payer: Self-pay

## 2015-05-25 ENCOUNTER — Encounter (HOSPITAL_COMMUNITY): Admission: EM | Disposition: A | Discharge status: Home / Self Care | Payer: Self-pay | Source: Home / Self Care

## 2015-05-25 ENCOUNTER — Inpatient Hospital Stay (HOSPITAL_COMMUNITY): Payer: Medicare Other | Admitting: Anesthesiology

## 2015-05-25 HISTORY — PX: COLONOSCOPY WITH PROPOFOL: SHX5780

## 2015-05-25 LAB — BASIC METABOLIC PANEL
ANION GAP: 8 (ref 5–15)
BUN: 9 mg/dL (ref 6–20)
CHLORIDE: 103 mmol/L (ref 101–111)
CO2: 26 mmol/L (ref 22–32)
Calcium: 8.3 mg/dL — ABNORMAL LOW (ref 8.9–10.3)
Creatinine, Ser: 0.85 mg/dL (ref 0.61–1.24)
GFR calc Af Amer: >60 mL/min (ref >60)
GFR calc non Af Amer: >60 mL/min (ref >60)
GLUCOSE: 113 mg/dL — AB (ref 65–99)
POTASSIUM: 3.6 mmol/L (ref 3.5–5.1)
Sodium: 137 mmol/L (ref 135–145)

## 2015-05-25 LAB — MAGNESIUM: MAGNESIUM: 1.8 mg/dL (ref 1.7–2.4)

## 2015-05-25 LAB — GLUCOSE, CAPILLARY
GLUCOSE-CAPILLARY: 116 mg/dL — AB (ref 65–99)
GLUCOSE-CAPILLARY: 117 mg/dL — AB (ref 65–99)
GLUCOSE-CAPILLARY: 119 mg/dL — AB (ref 65–99)
GLUCOSE-CAPILLARY: 127 mg/dL — AB (ref 65–99)
Glucose-Capillary: 112 mg/dL — ABNORMAL HIGH (ref 65–99)
Glucose-Capillary: 115 mg/dL — ABNORMAL HIGH (ref 65–99)
Glucose-Capillary: 127 mg/dL — ABNORMAL HIGH (ref 65–99)

## 2015-05-25 LAB — PHOSPHORUS: Phosphorus: 3.9 mg/dL (ref 2.5–4.6)

## 2015-05-25 SURGERY — COLONOSCOPY WITH PROPOFOL
Anesthesia: General

## 2015-05-25 MED ORDER — POLYETHYLENE GLYCOL 3350 17 GM/SCOOP PO POWD
0.5000 | Freq: Once | ORAL | Status: AC
  Administered 2015-05-25: 127.5 g via NASOGASTRIC
  Filled 2015-05-25: qty 255

## 2015-05-25 MED ORDER — FENTANYL CITRATE (PF) 100 MCG/2ML IJ SOLN: 25.0000 ug | INTRAMUSCULAR | Status: DC | PRN

## 2015-05-25 MED ORDER — PROPOFOL 10 MG/ML IV BOLUS
INTRAVENOUS | Status: AC
  Filled 2015-05-25: qty 40

## 2015-05-25 MED ORDER — COLCHICINE 0.6 MG PO TABS
1.2000 mg | ORAL_TABLET | Freq: Once | ORAL | Status: AC
  Administered 2015-05-25: 1.2 mg via ORAL
  Filled 2015-05-25 (×2): qty 2

## 2015-05-25 MED ORDER — TRACE MINERALS CR-CU-MN-SE-ZN 10-1000-500-60 MCG/ML IV SOLN
INTRAVENOUS | Status: AC
  Administered 2015-05-25: 18:00:00 via INTRAVENOUS
  Filled 2015-05-25: qty 1992

## 2015-05-25 MED ORDER — SODIUM CHLORIDE 0.9 % IV SOLN: INTRAVENOUS | Status: DC

## 2015-05-25 MED ORDER — POTASSIUM CHLORIDE 10 MEQ/100ML IV SOLN
10.0000 meq | INTRAVENOUS | Status: AC
  Administered 2015-05-25: 10 meq via INTRAVENOUS
  Filled 2015-05-25 (×2): qty 100

## 2015-05-25 MED ORDER — LACTATED RINGERS IV SOLN
INTRAVENOUS | Status: DC | PRN
  Administered 2015-05-25: 10:00:00 via INTRAVENOUS

## 2015-05-25 MED ORDER — KCL IN DEXTROSE-NACL 20-5-0.9 MEQ/L-%-% IV SOLN
INTRAVENOUS | Status: DC
  Administered 2015-05-25: 17:00:00 via INTRAVENOUS
  Administered 2015-05-26: 1000 mL via INTRAVENOUS
  Administered 2015-05-27: 12:00:00 via INTRAVENOUS
  Filled 2015-05-25 (×4): qty 1000

## 2015-05-25 MED ORDER — MAGNESIUM SULFATE IN D5W 10-5 MG/ML-% IV SOLN
1.0000 g | Freq: Once | INTRAVENOUS | Status: AC
  Administered 2015-05-25: 1 g via INTRAVENOUS
  Filled 2015-05-25: qty 100

## 2015-05-25 MED ORDER — FAT EMULSION 20 % IV EMUL
240.0000 mL | INTRAVENOUS | Status: AC
  Administered 2015-05-25: 240 mL via INTRAVENOUS
  Filled 2015-05-25: qty 250

## 2015-05-25 MED ORDER — SUCCINYLCHOLINE CHLORIDE 20 MG/ML IJ SOLN
INTRAMUSCULAR | Status: DC | PRN
  Administered 2015-05-25: 100 mg via INTRAVENOUS

## 2015-05-25 MED ORDER — DEXTROSE 5 % IV SOLN
2.0000 g | INTRAVENOUS | Status: DC
  Filled 2015-05-25 (×2): qty 2

## 2015-05-25 MED ORDER — COLCHICINE 0.6 MG PO TABS
0.6000 mg | ORAL_TABLET | Freq: Once | ORAL | Status: AC
  Administered 2015-05-25: 0.6 mg via ORAL
  Filled 2015-05-25 (×2): qty 1

## 2015-05-25 MED ORDER — PROPOFOL 10 MG/ML IV BOLUS
INTRAVENOUS | Status: DC | PRN
  Administered 2015-05-25: 100 mg via INTRAVENOUS

## 2015-05-25 SURGICAL SUPPLY — 22 items

## 2015-05-25 NOTE — Interval H&P Note (Signed)
History and Physical Interval Note:  05/25/2015 10:22 AM  Christian Black  has presented today for surgery, with the diagnosis of Cecal Mass  The various methods of treatment have been discussed with the patient and family. After consideration of risks, benefits and other options for treatment, the patient has consented to  Procedure(s): COLONOSCOPY WITH PROPOFOL (N/A) as a surgical intervention .  The patient's history has been reviewed, patient examined, no change in status, stable for surgery.  I have reviewed the patient's chart and labs.  Questions were answered to the patient's satisfaction.     Dellia Donnelly M  Assessment:  1.  Ileocecal valve mass. 2.  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  Plan:  1.  Colonoscopy. 2.  Risks (bleeding, infection, bowel perforation that could require surgery, sedation-related changes in cardiopulmonary systems), benefits (identification and possible treatment of source of symptoms, exclusion of certain causes of symptoms), and alternatives (watchful waiting, radiographic imaging studies, empiric medical treatment) of colonoscopy were explained to patient/family in detail and patient wishes to proceed.

## 2015-05-25 NOTE — Progress Notes (Signed)
05/25/15 1235: Call to [facility] to ask for "gout medicine" colchichine. [place] noted. Donne Hazel, RN

## 2015-05-25 NOTE — Progress Notes (Signed)
Colonoscopy today with Dr. Paulita Fujita aborted due to improper prep. Jobe Igo, RN

## 2015-05-25 NOTE — Op Note (Addendum)
[facility] [place], [address]   COLONOSCOPY PROCEDURE REPORT  PATIENT: Hilaire, Egert  MR#: [id number] BIRTHDATE: October 04, 1945 , 81  yrs. old GENDER: male ENDOSCOPIST: Arta Silence, MD REFERRED [id number] Hoxworth, M.D. PROCEDURE DATE:  2015/06/19 PROCEDURE:   Colonoscopy, diagnostic   (incomplete colonoscopy) ASA CLASS:   Class III INDICATIONS:ileocecal valve mass, small bowel obstruction. MEDICATIONS: General endotracheal anesthesia  DESCRIPTION OF PROCEDURE:   After the risks benefits and alternatives of the procedure were thoroughly explained, informed consent was obtained.  Digital rectal exam revealed large external hemorrhoids with element of rectal prolapse.        The ultraslim colonoscope was introduced through the anus and advanced to the sigmoid colon. No adverse events experienced.   The quality of the prep was poor.  The instrument was then slowly withdrawn as the colon was fully examined. Estimated blood loss is zero unless otherwise noted in this procedure report.    Findings:   External hemorrhoids with rectal prolapse, otherwise normal digital rectal exam.  Prep quality was poor, with solid impenetrable stool obscuring our views, and making passage of colonoscope proximal to the sigmoid colon impossible.  There were a few benign-appearing polyps seen in the rectosigmoid colon, which were neither biopsied nor removed.      Retroflexion was not performed.       Withdrawal time was not recorded, since full colonoscopy was done.     .  The scope was withdrawn and the procedure completed.  COMPLICATIONS: None immediate.  ENDOSCOPIC IMPRESSION:     As above.  Solid stool in distal colon, despite several days of enema prep.  RECOMMENDATIONS:     1.  Watch for potential complications or procedure. 2.  There is no utility in any further attempts at prep for colonoscopy.  Colonoscopy will not be reattempted. 3.  Would pursue  surgical intervention for ileocecal mass, and patient will then need retry colonoscopy few months post-operatively. 4.   [facility] will sign-off; please call with questions; thank you for the consultation.  eSigned:  Arta Silence, MD 2015/06/19 11:07 AM Revised: June 19, 2015 11:07 AM  cc:  CPT CODES: ICD CODES:  The ICD and CPT codes recommended by this software are interpretations from the data that the clinical staff has captured with the software.  The verification of the translation of this report to the ICD and CPT codes and modifiers is the sole responsibility of the health care institution and practicing physician where this report was generated.  [employer] will not be held responsible for the validity of the ICD and CPT codes included on this report.  AMA assumes no liability for data contained or not contained herein. CPT is a Designer, television/film set of the [employer].

## 2015-05-25 NOTE — Progress Notes (Signed)
PARENTERAL NUTRITION CONSULT NOTE - follow up  Pharmacy Consult for TPN Indication: SBO  No Known Allergies  Patient Measurements: Height: 6\' 4"  (193 cm) Weight: 159 lb 3.2 oz (72.213 kg) IBW/kg (Calculated) : 86.8   Vital Signs: Temp: 99.8 F (37.7 C) (02/22 0558) Temp Source: Oral (02/22 0558) BP: 135/57 mmHg (02/22 0558) Pulse Rate: 67 (02/22 0558) Intake/Output from previous day: 02/21 0701 - 02/22 0700 In: 856.9 [I.V.:856.9] Out: 1300 [Urine:1100; Emesis/NG output:200] Intake/Output from this shift:    Labs:  Recent Labs  05/24/15 0420  WBC 6.5  HGB 12.3*  HCT 37.8*  PLT 235     Recent Labs  05/24/15 0420 05/25/15 0340  NA 139 137  K 3.6 3.6  CL 107 103  CO2 26 26  GLUCOSE 128* 113*  BUN 7 9  CREATININE 0.94 0.85  CALCIUM 8.8* 8.3*  MG 1.7 1.8  PHOS 3.8 3.9  PROT 6.8  --   ALBUMIN 3.1*  --   AST 15  --   ALT 6*  --   ALKPHOS 42  --   BILITOT 0.9  --   PREALBUMIN 10.7*  --   TRIG 53  --    Estimated Creatinine Clearance: 86.1 mL/min (by C-G formula based on Cr of 0.85).    Recent Labs  05/25/15 0010 05/25/15 0412 05/25/15 0804  GLUCAP 127* 116* 112*    Medical History: Past Medical History  Diagnosis Date  . 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  Significant events:   Today:    Glucose - <150  Electrolytes - K and Mag on the low end of normal, CCa WNL, all others WNL  Renal - WNL  LFTs - WNL   TGs - 53  Prealbumin - 15.6 (2/18), 10.7 (2/21)  Colonoscopy scheduled for  today  NUTRITIONAL GOALS                                                                                             RD recs: pending Clinimix E 5 15/ at a goal rate of 71ml/hr + 20% fat emulsion at 57ml/hr to provide: ~100g/day protein, 1912 Kcal/day.  PLAN  KCl 38meq IV x2, Mag Sulfate 1gm IV x1                    At 1800 today:  Increase Clinimix E 5/15 to 68ml/hr.  Continue 20% fat emulsion at 22ml/hr.  TPN to contain standard multivitamins and trace elements.  Reduce IVF to 58ml/hr.  continue moderate SSI q6h   TPN lab panels on Mondays & Thursdays.  F/u daily.  Dolly Rias RPh 05/25/2015, 8:33 AM Pager [phone]

## 2015-05-25 NOTE — Progress Notes (Signed)
Patient ID: Christian Black, male   DOB: 06-10-1945, 70 y.o.   MRN: [id number]     [facility]      [address] [place], [place] [address]    Phone: [phone] FAX: [phone]     Subjective: BMs clears. Afebrile.  VSS.  NGT output is mostly water.   Objective:  Vital signs:  Filed Vitals:   05/24/15 0600 05/24/15 1409 05/24/15 2231 05/25/15 0558  BP: 162/68 144/72 162/92 135/57  Pulse: 68 65 65 67  Temp: 98.9 F (37.2 C) 98.4 F (36.9 C) 99.4 F (37.4 C) 99.8 F (37.7 C)  TempSrc: Oral Oral Oral Oral  Resp: _0 Height:      Weight:      SpO2: 100% 97% 100% 99%    Last BM Date: 05/16/15  Intake/Output   Yesterday:  02/21 0701 - 02/22 0700 In: 856.9 [I.V.:856.9] Out: 1300 [Urine:1100; Emesis/NG output:200] This shift:    I/O last 3 completed shifts: In: 1936.9 [I.V.:1536.9] Out: 2050 [Urine:1800; Emesis/NG output:250]       Physical Exam: General: Pt awake/alert/oriented x4 in no acute distress Chest: cta. No chest wall pain w good excursion CV: Pulses intact. Regular rhythm MS: Normal AROM mjr joints. No obvious deformity Abdomen: Soft. Nondistended. Non tender. No evidence of peritonitis. No incarcerated hernias. Ext: No mjr edema. No cyanosis Skin: No petechiae / purpura   Problem List:   Principal Problem:   SBO (small bowel obstruction) (HCC) Active Problems:   Colonic mass   Acute kidney injury [facility])    Results:   Labs: Results for orders placed or performed during the hospital encounter of 05/20/15 (from the past 48 hour(s))  Glucose, capillary     Status: Abnormal   Collection Time: 05/23/15  8:27 PM  Result Value Ref Range   Glucose-Capillary 120 (H) 65 - 99 mg/dL   Comment 1 Notify RN    Comment 2 Document in Chart   Glucose, capillary     Status: Abnormal   Collection Time: 05/24/15 12:05 AM  Result Value Ref Range   Glucose-Capillary 125 (H) 65 - 99  mg/dL  Comprehensive metabolic panel     Status: Abnormal   Collection Time: 05/24/15  4:20 AM  Result Value Ref Range   Sodium 139 135 - 145 mmol/L   Potassium 3.6 3.5 - 5.1 mmol/L   Chloride 107 101 - 111 mmol/L   CO2 26 22 - 32 mmol/L   Glucose, Bld 128 (H) 65 - 99 mg/dL   BUN 7 6 - 20 mg/dL   Creatinine, Ser 0.94 0.61 - 1.24 mg/dL   Calcium 8.8 (L) 8.9 - 10.3 mg/dL   Total Protein 6.8 6.5 - 8.1 g/dL   Albumin 3.1 (L) 3.5 - 5.0 g/dL   AST 15 15 - 41 U/L   ALT 6 (L) 17 - 63 U/L   Alkaline Phosphatase 42 38 - 126 U/L   Total Bilirubin 0.9 0.3 - 1.2 mg/dL   GFR calc non Af Amer >60 >60 mL/min   GFR calc Af Amer >60 >60 mL/min    Comment: (NOTE) The eGFR has been calculated using the CKD EPI equation. This calculation has not been validated in all clinical situations. eGFR's persistently <60 mL/min signify possible Chronic Kidney Disease.    Anion gap 6 5 - 15  Prealbumin     Status: Abnormal   Collection Time: 05/24/15  4:20 AM  Result Value  Ref Range   Prealbumin 10.7 (L) 18 - 38 mg/dL    Comment: Performed at [facility]  Magnesium     Status: None   Collection Time: 05/24/15  4:20 AM  Result Value Ref Range   Magnesium 1.7 1.7 - 2.4 mg/dL  Phosphorus     Status: None   Collection Time: 05/24/15  4:20 AM  Result Value Ref Range   Phosphorus 3.8 2.5 - 4.6 mg/dL  Triglycerides     Status: None   Collection Time: 05/24/15  4:20 AM  Result Value Ref Range   Triglycerides 53 <150 mg/dL    Comment: Performed at [facility]  CBC     Status: Abnormal   Collection Time: 05/24/15  4:20 AM  Result Value Ref Range   WBC 6.5 4.0 - 10.5 K/uL   RBC 4.15 (L) 4.22 - 5.81 MIL/uL   Hemoglobin 12.3 (L) 13.0 - 17.0 g/dL   HCT 37.8 (L) 39.0 - 52.0 %   MCV 91.1 78.0 - 100.0 fL   MCH 29.6 26.0 - 34.0 pg   MCHC 32.5 30.0 - 36.0 g/dL   RDW 12.7 11.5 - 15.5 %   Platelets 235 150 - 400 K/uL  Differential     Status: None   Collection Time: 05/24/15  4:20 AM  Result  Value Ref Range   Neutrophils Relative % 73 %   Neutro Abs 4.8 1.7 - 7.7 K/uL   Lymphocytes Relative 18 %   Lymphs Abs 1.1 0.7 - 4.0 K/uL   Monocytes Relative 8 %   Monocytes Absolute 0.5 0.1 - 1.0 K/uL   Eosinophils Relative 1 %   Eosinophils Absolute 0.1 0.0 - 0.7 K/uL   Basophils Relative 0 %   Basophils Absolute 0.0 0.0 - 0.1 K/uL  Glucose, capillary     Status: Abnormal   Collection Time: 05/24/15  4:33 AM  Result Value Ref Range   Glucose-Capillary 108 (H) 65 - 99 mg/dL  Glucose, capillary     Status: Abnormal   Collection Time: 05/24/15 12:23 PM  Result Value Ref Range   Glucose-Capillary 100 (H) 65 - 99 mg/dL  Glucose, capillary     Status: Abnormal   Collection Time: 05/24/15  5:30 PM  Result Value Ref Range   Glucose-Capillary 107 (H) 65 - 99 mg/dL  Glucose, capillary     Status: Abnormal   Collection Time: 05/24/15 10:30 PM  Result Value Ref Range   Glucose-Capillary 101 (H) 65 - 99 mg/dL  Glucose, capillary     Status: Abnormal   Collection Time: 05/25/15 12:10 AM  Result Value Ref Range   Glucose-Capillary 127 (H) 65 - 99 mg/dL  Basic metabolic panel     Status: Abnormal   Collection Time: 05/25/15  3:40 AM  Result Value Ref Range   Sodium 137 135 - 145 mmol/L   Potassium 3.6 3.5 - 5.1 mmol/L   Chloride 103 101 - 111 mmol/L   CO2 26 22 - 32 mmol/L   Glucose, Bld 113 (H) 65 - 99 mg/dL   BUN 9 6 - 20 mg/dL   Creatinine, Ser 0.85 0.61 - 1.24 mg/dL   Calcium 8.3 (L) 8.9 - 10.3 mg/dL   GFR calc non Af Amer >60 >60 mL/min   GFR calc Af Amer >60 >60 mL/min    Comment: (NOTE) The eGFR has been calculated using the CKD EPI equation. This calculation has not been validated in all clinical situations. eGFR's persistently <60 mL/min signify  possible Chronic Kidney Disease.    Anion gap 8 5 - 15  Magnesium     Status: None   Collection Time: 05/25/15  3:40 AM  Result Value Ref Range   Magnesium 1.8 1.7 - 2.4 mg/dL  Phosphorus     Status: None   Collection Time:  05/25/15  3:40 AM  Result Value Ref Range   Phosphorus 3.9 2.5 - 4.6 mg/dL  Glucose, capillary     Status: Abnormal   Collection Time: 05/25/15  4:12 AM  Result Value Ref Range   Glucose-Capillary 116 (H) 65 - 99 mg/dL  Glucose, capillary     Status: Abnormal   Collection Time: 05/25/15  8:04 AM  Result Value Ref Range   Glucose-Capillary 112 (H) 65 - 99 mg/dL    Imaging / Studies: No results found.  Medications / Allergies:  Scheduled Meds: . chlorhexidine  15 mL Mouth/Throat BID  . enoxaparin (LOVENOX) injection  40 mg Subcutaneous Q24H  . insulin aspart  0-15 Units Subcutaneous 4 times per day  . magnesium sulfate 1 - 4 g bolus IVPB  1 g Intravenous Once  . pantoprazole (PROTONIX) IV  40 mg Intravenous QHS  . potassium chloride  10 mEq Intravenous Q1 Hr x 2  . sodium phosphate  1 enema Rectal BID   Continuous Infusions: . dextrose 5 % and 0.9 % NaCl with KCl 20 mEq/L 65 mL/hr at 05/24/15 1649  . dextrose 5 % and 0.9 % NaCl with KCl 20 mEq/L    . Marland KitchenTPN (CLINIMIX-E) Adult 60 mL/hr at 05/24/15 1718   And  . fat emulsion 240 mL (05/24/15 1718)  . Marland KitchenTPN (CLINIMIX-E) Adult     And  . fat emulsion     PRN Meds:.menthol-cetylpyridinium, morphine injection, ondansetron **OR** ondansetron (ZOFRAN) IV, sodium chloride flush  Antibiotics: Anti-infectives    None         Assessment/Plan SBO 2/2 cecal mass-colonoscopy today followed by surgery possibly tomorrow  VTE prophylaxis-SCD/lovenox PCM-prealbumin 10.7(2/21).TPN  FEN-NPO. Hypokalemia-resolved Dispo-colonoscopy today    Erby Pian, [facility] Pager [phone]-[phone])   05/25/2015 9:19 AM

## 2015-05-25 NOTE — Anesthesia Procedure Notes (Signed)
Procedure Name: Intubation Date/Time: 05/25/2015 10:40 AM Performed by: Dione Booze Pre-anesthesia Checklist: Emergency Drugs available, Suction available, Patient being monitored and Patient identified Patient Re-evaluated:Patient Re-evaluated prior to inductionOxygen Delivery Method: Circle system utilized Preoxygenation: Pre-oxygenation with 100% oxygen Intubation Type: IV induction, Rapid sequence and Cricoid Pressure applied Laryngoscope Size: Mac and 4 Grade View: Grade II Tube type: Oral Tube size: 7.5 mm Number of attempts: 1 Airway Equipment and Method: Stylet Placement Confirmation: ETT inserted through vocal cords under direct vision,  breath sounds checked- equal and bilateral and positive ETCO2 Secured at: 21 cm Tube secured with: Tape Dental Injury: Teeth and Oropharynx as per pre-operative assessment  Comments: Poor dentition

## 2015-05-25 NOTE — Progress Notes (Signed)
Patient ID: Christian Black, male   DOB: Sep 25, 1945, 70 y.o.   MRN: [id number]  Acute gout flare-colchicine 1.2mg  x1 now.  .6mg  in 1 hour.  Plan to resume prophylaxis dose tomorrow

## 2015-05-25 NOTE — H&P (View-Only) (Signed)
Patient ID: Christian Black, male   DOB: 07/29/1945, 70 y.o.   MRN: [id number]    Subjective: He denies abdominal pain or other complaints. Has had loose bowel movements. TNA has started  Objective: Vital signs in last 24 hours: Temp:  [98.8 F (37.1 C)-99.7 F (37.6 C)] 98.9 F (37.2 C) (02/21 0600) Pulse Rate:  [55-68] 68 (02/21 0600) Resp:  [16-20] 18 (02/21 0600) BP: (151-172)/(65-69) 162/68 mmHg (02/21 0600) SpO2:  [100 %] 100 % (02/21 0600) Last BM Date: 05/23/15  Intake/Output from previous day: 02/20 0701 - 02/21 0700 In: 2164.4 [P.O.:75; I.V.:1648.6; TPN:440.8] Out: 1250 [Urine:1200; Emesis/NG output:50] Intake/Output this shift: Total I/O In: -  Out: 300 [Urine:300]  General appearance: drowsy but easily arousable and conversant, in no distress GI: mild distention, soft and nontender, no palpable masses  Lab Results:   Recent Labs  05/22/15 0509 05/24/15 0420  WBC 4.8 6.5  HGB 12.1* 12.3*  HCT 36.6* 37.8*  PLT 224 235   BMET  Recent Labs  05/22/15 0509 05/24/15 0420  NA 139 139  K 3.3* 3.6  CL 107 107  CO2 26 26  GLUCOSE 114* 128*  BUN 8 7  CREATININE 0.98 0.94  CALCIUM 8.1* 8.8*     Studies/Results: No results found.  Anti-infectives: Anti-infectives    None      Assessment/Plan: Small bowel obstruction with likely cecal mass versus inflammatory change. Abdominal distention improved and prepping successfully. Colonoscopy planned tomorrow. Surgical plans to follow this.    LOS: 4 days    [place]anequa Spees T 05/24/2015

## 2015-05-25 NOTE — Transfer of Care (Signed)
Immediate Anesthesia Transfer of Care Note  Patient: Christian Black  Procedure(s) Performed: Procedure(s): COLONOSCOPY WITH PROPOFOL (N/A)  Patient Location: PACU and Endoscopy Unit  Anesthesia Type:General  Level of Consciousness: awake, alert  and patient cooperative  Airway & Oxygen Therapy: Patient Spontanous Breathing and Patient connected to face mask oxygen  Post-op Assessment: Report given to RN and Post -op Vital signs reviewed and stable  Post vital signs: Reviewed and stable  Last Vitals:  Filed Vitals:   05/25/15 0558 05/25/15 0934  BP: 135/57 175/82  Pulse: 67 66  Temp: 37.7 C   Resp: 16 22    Complications: No apparent anesthesia complications

## 2015-05-25 NOTE — Anesthesia Postprocedure Evaluation (Signed)
Anesthesia Post Note  Patient: Christian Black  Procedure(s) Performed: Procedure(s) (LRB): COLONOSCOPY WITH PROPOFOL (N/A)  Patient location during evaluation: PACU Anesthesia Type: General Level of consciousness: sedated Pain management: satisfactory to patient Vital Signs Assessment: post-procedure vital signs reviewed and stable Respiratory status: spontaneous breathing Cardiovascular status: stable Anesthetic complications: no    Last Vitals:  Filed Vitals:   05/25/15 1130 05/25/15 1400  BP: 190/81 170/88  Pulse: 82 75  Temp: 36.9 C 37.7 C  Resp: 27 18    Last Pain:  Filed Vitals:   05/25/15 1502  PainSc: Asleep                 Sharlet Notaro EDWARD

## 2015-05-25 NOTE — Anesthesia Preprocedure Evaluation (Addendum)
Anesthesia Evaluation  Patient identified by MRN, date of birth, ID band Patient awake    Reviewed: Allergy & Precautions, H&P , Patient's Chart, lab work & pertinent test results, reviewed documented beta blocker date and time   Airway Mallampati: II  TM Distance: >3 FB Neck ROM: full    Dental no notable dental hx.    Pulmonary Current Smoker,    Pulmonary exam normal breath sounds clear to auscultation       Cardiovascular  Rhythm:regular Rate:Normal     Neuro/Psych    GI/Hepatic   Endo/Other    Renal/GU      Musculoskeletal   Abdominal   Peds  Hematology   Anesthesia Other Findings SBO, ng in place  Reproductive/Obstetrics                            Anesthesia Physical Anesthesia Plan  ASA: II  Anesthesia Plan: General   Post-op Pain Management:    Induction: Intravenous, Rapid sequence and Cricoid pressure planned  Airway Management Planned: Mask and Natural Airway  Additional Equipment:   Intra-op Plan:   Post-operative Plan:   Informed Consent: I have reviewed the patients History and Physical, chart, labs and discussed the procedure including the risks, benefits and alternatives for the proposed anesthesia with the patient or authorized representative who has indicated his/her understanding and acceptance.   Dental Advisory Given  Plan Discussed with: CRNA and Surgeon  Anesthesia Plan Comments: (Discussed sedation and potential to need to place airway or ETT if warranted by clinical changes intra-operatively. We will start procedure as MAC.)       Anesthesia Quick Evaluation

## 2015-05-26 ENCOUNTER — Encounter (HOSPITAL_COMMUNITY): Payer: Self-pay | Admitting: Gastroenterology

## 2015-05-26 DIAGNOSIS — E43 Unspecified severe protein-calorie malnutrition: Secondary | ICD-10-CM | POA: Insufficient documentation

## 2015-05-26 LAB — GLUCOSE, CAPILLARY
GLUCOSE-CAPILLARY: 112 mg/dL — AB (ref 65–99)
Glucose-Capillary: 95 mg/dL (ref 65–99)
Glucose-Capillary: 121 mg/dL — ABNORMAL HIGH (ref 65–99)
Glucose-Capillary: 121 mg/dL — ABNORMAL HIGH (ref 65–99)
Glucose-Capillary: 105 mg/dL — ABNORMAL HIGH (ref 65–99)

## 2015-05-26 LAB — COMPREHENSIVE METABOLIC PANEL
ALK PHOS: 42 U/L (ref 38–126)
ALT: 10 U/L — ABNORMAL LOW (ref 17–63)
ANION GAP: 8 (ref 5–15)
AST: 18 U/L (ref 15–41)
Albumin: 2.8 g/dL — ABNORMAL LOW (ref 3.5–5.0)
BILIRUBIN TOTAL: 0.8 mg/dL (ref 0.3–1.2)
BUN: 14 mg/dL (ref 6–20)
CALCIUM: 8.4 mg/dL — AB (ref 8.9–10.3)
CO2: 26 mmol/L (ref 22–32)
Chloride: 103 mmol/L (ref 101–111)
Creatinine, Ser: 0.86 mg/dL (ref 0.61–1.24)
Glucose, Bld: 107 mg/dL — ABNORMAL HIGH (ref 65–99)
Potassium: 3.6 mmol/L (ref 3.5–5.1)
SODIUM: 137 mmol/L (ref 135–145)
TOTAL PROTEIN: 6.7 g/dL (ref 6.5–8.1)

## 2015-05-26 LAB — PHOSPHORUS: PHOSPHORUS: 3.7 mg/dL (ref 2.5–4.6)

## 2015-05-26 LAB — MAGNESIUM: MAGNESIUM: 1.9 mg/dL (ref 1.7–2.4)

## 2015-05-26 MED ORDER — COLCHICINE 0.6 MG PO TABS
0.6000 mg | ORAL_TABLET | Freq: Every day | ORAL | Status: DC
  Administered 2015-05-26 – 2015-05-31 (×5): 0.6 mg via ORAL
  Filled 2015-05-26 (×6): qty 1

## 2015-05-26 MED ORDER — MAGNESIUM SULFATE IN D5W 10-5 MG/ML-% IV SOLN
1.0000 g | Freq: Once | INTRAVENOUS | Status: AC
  Administered 2015-05-26: 1 g via INTRAVENOUS
  Filled 2015-05-26: qty 100

## 2015-05-26 MED ORDER — TRACE MINERALS CR-CU-MN-SE-ZN 10-1000-500-60 MCG/ML IV SOLN
INTRAVENOUS | Status: AC
  Administered 2015-05-26: 17:00:00 via INTRAVENOUS
  Filled 2015-05-26: qty 1992

## 2015-05-26 MED ORDER — POTASSIUM CHLORIDE 10 MEQ/100ML IV SOLN
10.0000 meq | INTRAVENOUS | Status: AC
  Administered 2015-05-26 (×2): 10 meq via INTRAVENOUS
  Filled 2015-05-26 (×2): qty 100

## 2015-05-26 MED ORDER — FAT EMULSION 20 % IV EMUL
240.0000 mL | INTRAVENOUS | Status: AC
  Administered 2015-05-26: 240 mL via INTRAVENOUS
  Filled 2015-05-26: qty 250

## 2015-05-26 NOTE — Progress Notes (Signed)
PARENTERAL NUTRITION CONSULT NOTE - follow up  Pharmacy Consult for TPN Indication: SBO  No Known Allergies  Patient Measurements: Height: 6\' 4"  (193 cm) Weight: 159 lb 3.2 oz (72.213 kg) IBW/kg (Calculated) : 86.8   Vital Signs: Temp: 99.5 F (37.5 C) (02/23 0511) Temp Source: Oral (02/23 0511) BP: 140/66 mmHg (02/23 0511) Pulse Rate: 72 (02/23 0511) Intake/Output from previous day: 02/22 0701 - 02/23 0700 In: 1400.5 [I.V.:1300.5; IV Piggyback:100] Out: [id number] [Urine:1125; Emesis/NG output:350] Intake/Output from this shift:    Labs:  Recent Labs  05/24/15 0420  WBC 6.5  HGB 12.3*  HCT 37.8*  PLT 235     Recent Labs  05/24/15 0420 05/25/15 0340 05/26/15 0403  NA 139 137 137  K 3.6 3.6 3.6  CL 107 103 103  CO2 26 26 26   GLUCOSE 128* 113* 107*  BUN 7 9 14   CREATININE 0.94 0.85 0.86  CALCIUM 8.8* 8.3* 8.4*  MG 1.7 1.8 1.9  PHOS 3.8 3.9 3.7  PROT 6.8  --  6.7  ALBUMIN 3.1*  --  2.8*  AST 15  --  18  ALT 6*  --  10*  ALKPHOS 42  --  42  BILITOT 0.9  --  0.8  PREALBUMIN 10.7*  --   --   TRIG 53  --   --    Estimated Creatinine Clearance: 85.1 mL/min (by C-G formula based on Cr of 0.86).    Recent Labs  05/25/15 1952 05/25/15 2345 05/26/15 0325  GLUCAP 115* 127* 112*    Medical History: Past Medical History  Diagnosis Date  . 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  Significant events:  2/22: unable to perform colonoscopy due to poor prep.  GI recommends surgical  intervention for ileocecal mass, then repeat colonoscopy in a few months post-op. Plan for right colectomy on 2/24.  Today:   Glucose (goal <150): <150  Electrolytes - K 3.6 and Mag 1.9, CoCa, Phos, and other lytes WNL  Renal - WNL  LFTs - WNL   TGs - 53 (2/21)  Prealbumin - 15.6 (2/18), 10.7 (2/21)  NUTRITIONAL GOALS  RD recs:  Kcal: 2000-2200 Protein: 90-100 grams  Fluid: >/= 2 L   Clinimix E 5 15/ at a goal rate of 70ml/hr + 20% fat emulsion at 57ml/hr to provide: ~100g/day protein, 1912 Kcal/day.  PLAN                                                                                                  - potassium chloride 10 meq IV x2 runs - magnesium sulfate 1 gm IV x1 - At 1800 today:  Continue Clinimix E 5/15 to 66ml/hr.  Continue 20% fat emulsion at 64ml/hr.  TPN to contain standard multivitamins and trace elements.  continue IVF at 13ml/hr.  continue moderate SSI q6h   TPN lab panels on Mondays & Thursdays.  F/u daily.  Dia Sitter, PharmD, BCPS 05/26/2015 7:09 AM

## 2015-05-26 NOTE — Progress Notes (Signed)
Patient's daughter at bedside,updated about father's status.

## 2015-05-26 NOTE — Progress Notes (Signed)
Patient ID: Christian Black, male   DOB: May 29, 1945, 70 y.o.   MRN: [id number] 1 Day Post-Op  Subjective: No complaints this morning. Still tolerating a slow prep.Has had a couple of bowel movements.  Objective: Vital signs in last 24 hours: Temp:  [98.5 F (36.9 C)-99.9 F (37.7 C)] 99.5 F (37.5 C) (02/23 0511) Pulse Rate:  [66-93] 72 (02/23 0511) Resp:  [17-27] 18 (02/23 0511) BP: (140-194)/(66-90) 140/66 mmHg (02/23 0511) SpO2:  [96 %-100 %] 100 % (02/23 0511) Last BM Date: 05/23/15  Intake/Output from previous day: 02/22 0701 - 02/23 0700 In: 1400.5 [I.V.:1300.5; IV Piggyback:100] Out: 1475 [Urine:1125; Emesis/NG output:350] Intake/Output this shift:    General appearance: alert, cooperative and no distress Resp: clear to auscultation bilaterally GI: normal findings: soft, non-tender  Lab Results:   Recent Labs  05/24/15 0420  WBC 6.5  HGB 12.3*  HCT 37.8*  PLT 235   BMET  Recent Labs  05/25/15 0340 05/26/15 0403  NA 137 137  K 3.6 3.6  CL 103 103  CO2 26 26  GLUCOSE 113* 107*  BUN 9 14  CREATININE 0.85 0.86  CALCIUM 8.3* 8.4*     Studies/Results: No results found.  Anti-infectives: Anti-infectives    Start     Dose/Rate Route Frequency Ordered Stop   05/26/15 0600  cefOXitin (MEFOXIN) 2 g in dextrose 5 % 50 mL IVPB    Comments:  Pharmacy may adjust dosing strength, interval, or rate of medication as needed for optimal therapy for the patient Send with patient on call to the OR.  Anesthesia to complete antibiotic administration <23min prior to incision per [facility].   2 g 100 mL/hr over 30 Minutes Intravenous On call to O.R. 05/25/15 1321 05/27/15 0559      Assessment/Plan: Bowel obstruction with apparent colonic mass. Unable to complete colonoscopy due to poor prep. Severe malnutrition. On TNA. I think we need to proceed with surgery and likely right colectomy. I discussed this with the patient and reviewed findings today. I described  the surgery and expected recovery and indications as well as risks of bleeding, infection, anesthetic complications, anastomotic leak and ostomy, possible need for open surgery. All questions were answered and he agrees to proceed. We will plan for tomorrow.    LOS: 6 days    Christle Nolting T 05/26/2015

## 2015-05-26 NOTE — Progress Notes (Signed)
Nutrition Follow-up  DOCUMENTATION CODES:   Severe malnutrition in context of acute illness/injury  INTERVENTION:   TPN per Pharmacy Patient is at risk for refeeding syndrome due to severe muscle and fat depletion, please monitor magnesium, phosphorus, and potassium for 3 days.  RD to continue to monitor  NUTRITION DIAGNOSIS:   Inadequate oral intake related to inability to eat as evidenced by NPO status.  Ongoing.  GOAL:   Patient will meet greater than or equal to 90% of their needs  Progressing.  MONITOR:   Other (Comment), Labs, Weight trends (TPN tolerance)  ASSESSMENT:   Pt states he had the abrupt onset of abdominal pain with distention followed by nausea and vomiting. He still having some bowel movements and passing gas. Because of the pain and persistent vomiting he presented to the emergency department. 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 I was asked to see him because of this. He has not seen a physician in many years. He has never had a colonoscopy. He denies any family history of gastrointestinal cancer. He states he has lost some weight. He cannot quantitate the weight loss. He drinks alcohol daily (no more than 2 beers).  Per surgery, pt will continue TPN. Plan is for a right colectomy, possibly tomorrow. Pt with BMs today.  Plan per Pharmacy 2/23: - potassium chloride 10 meq IV x2 runs - magnesium sulfate 1 gm IV x1 - At 1800 today:  Continue Clinimix E 5/15 to 20ml/hr.  Continue 20% fat emulsion at 56ml/hr.   Medications: D5 -.9%NaCl w/ KCl 20 infusion @ 45 ml/hr -> provides 184 kcal Labs reviewed: CBGs: 95-121 Mg/Phos/K WNL  Diet Order:  Diet NPO time specified TPN (CLINIMIX-E) Adult TPN (CLINIMIX-E) Adult  Skin:  Reviewed, no issues  Last BM:  2/23  Height:   Ht Readings from Last 1 Encounters:  05/20/15 6\' 4"  (1.93 m)    Weight:    Wt Readings from Last 1 Encounters:  05/20/15 159 lb 3.2 oz (72.213 kg)    Ideal Body Weight:  91.8 kg  BMI:  Body mass index is 19.39 kg/(m^2).  Estimated Nutritional Needs:   Kcal:  2000-2200  Protein:  90-100 grams   Fluid:  >/= 2 L   EDUCATION NEEDS:   No education needs identified at this time  Clayton Bibles, MS, RD, LDN Pager: [phone] After Hours Pager: [phone]

## 2015-05-27 ENCOUNTER — Inpatient Hospital Stay (HOSPITAL_COMMUNITY): Payer: Medicare Other | Admitting: Anesthesiology

## 2015-05-27 ENCOUNTER — Encounter (HOSPITAL_COMMUNITY): Payer: Self-pay | Admitting: Anesthesiology

## 2015-05-27 ENCOUNTER — Encounter (HOSPITAL_COMMUNITY): Admission: EM | Disposition: A | Discharge status: Home / Self Care | Payer: Self-pay | Source: Home / Self Care

## 2015-05-27 DIAGNOSIS — C182 Malignant neoplasm of ascending colon: Secondary | ICD-10-CM | POA: Insufficient documentation

## 2015-05-27 HISTORY — PX: COLON RESECTION: SHX5231

## 2015-05-27 LAB — GLUCOSE, CAPILLARY
GLUCOSE-CAPILLARY: 126 mg/dL — AB (ref 65–99)
Glucose-Capillary: 128 mg/dL — ABNORMAL HIGH (ref 65–99)
Glucose-Capillary: 172 mg/dL — ABNORMAL HIGH (ref 65–99)
Glucose-Capillary: 150 mg/dL — ABNORMAL HIGH (ref 65–99)
Glucose-Capillary: 147 mg/dL — ABNORMAL HIGH (ref 65–99)

## 2015-05-27 SURGERY — LAPAROSCOPIC RIGHT COLON RESECTION
Anesthesia: General

## 2015-05-27 MED ORDER — FENTANYL CITRATE (PF) 250 MCG/5ML IJ SOLN
INTRAMUSCULAR | Status: AC
  Filled 2015-05-27: qty 5

## 2015-05-27 MED ORDER — SODIUM CHLORIDE 0.9 % IJ SOLN
INTRAMUSCULAR | Status: AC
  Filled 2015-05-27: qty 10

## 2015-05-27 MED ORDER — LIDOCAINE HCL (CARDIAC) 20 MG/ML IV SOLN
INTRAVENOUS | Status: AC
  Filled 2015-05-27: qty 5

## 2015-05-27 MED ORDER — SUGAMMADEX SODIUM 200 MG/2ML IV SOLN
INTRAVENOUS | Status: DC | PRN
  Administered 2015-05-27: 200 mg via INTRAVENOUS

## 2015-05-27 MED ORDER — FAT EMULSION 20 % IV EMUL
240.0000 mL | INTRAVENOUS | Status: AC
  Administered 2015-05-27: 240 mL via INTRAVENOUS
  Filled 2015-05-27: qty 250

## 2015-05-27 MED ORDER — LACTATED RINGERS IV SOLN
INTRAVENOUS | Status: DC | PRN
  Administered 2015-05-27 (×2): via INTRAVENOUS

## 2015-05-27 MED ORDER — SUCCINYLCHOLINE CHLORIDE 20 MG/ML IJ SOLN
INTRAMUSCULAR | Status: DC | PRN
  Administered 2015-05-27: 100 mg via INTRAVENOUS

## 2015-05-27 MED ORDER — DEXTROSE 5 % IV SOLN
INTRAVENOUS | Status: AC
  Filled 2015-05-27: qty 2

## 2015-05-27 MED ORDER — FENTANYL CITRATE (PF) 100 MCG/2ML IJ SOLN
INTRAMUSCULAR | Status: DC | PRN
Start: 2015-05-27 — End: 2015-05-27
  Administered 2015-05-27 (×3): 50 ug via INTRAVENOUS
  Administered 2015-05-27: 100 ug via INTRAVENOUS

## 2015-05-27 MED ORDER — CEFOXITIN SODIUM 2 G IV SOLR
2.0000 g | INTRAVENOUS | Status: DC | PRN
  Administered 2015-05-27 (×2): 2 g via INTRAVENOUS

## 2015-05-27 MED ORDER — PROPOFOL 10 MG/ML IV BOLUS
INTRAVENOUS | Status: AC
  Filled 2015-05-27: qty 20

## 2015-05-27 MED ORDER — ROCURONIUM BROMIDE 100 MG/10ML IV SOLN
INTRAVENOUS | Status: AC
  Filled 2015-05-27: qty 1

## 2015-05-27 MED ORDER — MORPHINE SULFATE (PF) 2 MG/ML IV SOLN
2.0000 mg | INTRAVENOUS | Status: DC | PRN
  Administered 2015-05-27: 2 mg via INTRAVENOUS
  Administered 2015-05-27 (×3): 4 mg via INTRAVENOUS
  Administered 2015-05-28 – 2015-05-30 (×4): 2 mg via INTRAVENOUS
  Filled 2015-05-27: qty 2
  Filled 2015-05-27 (×2): qty 1
  Filled 2015-05-27: qty 2
  Filled 2015-05-27 (×2): qty 1
  Filled 2015-05-27: qty 2
  Filled 2015-05-27: qty 1

## 2015-05-27 MED ORDER — MIDAZOLAM HCL 2 MG/2ML IJ SOLN
INTRAMUSCULAR | Status: AC
  Filled 2015-05-27: qty 2

## 2015-05-27 MED ORDER — FAT EMULSION 20 % INFUSION TNA - OPTIME
INTRAVENOUS | Status: DC | PRN
  Administered 2015-05-27: 10 mL/h via INTRAVENOUS

## 2015-05-27 MED ORDER — METOPROLOL TARTRATE 1 MG/ML IV SOLN
5.0000 mg | Freq: Four times a day (QID) | INTRAVENOUS | Status: DC | PRN
  Administered 2015-05-29: 5 mg via INTRAVENOUS
  Filled 2015-05-27 (×3): qty 5

## 2015-05-27 MED ORDER — OXYCODONE HCL 5 MG PO TABS: 5.0000 mg | ORAL_TABLET | Freq: Once | ORAL | Status: DC | PRN

## 2015-05-27 MED ORDER — LIDOCAINE HCL (CARDIAC) 20 MG/ML IV SOLN
INTRAVENOUS | Status: DC | PRN
  Administered 2015-05-27: 50 mg via INTRAVENOUS

## 2015-05-27 MED ORDER — PROPOFOL 10 MG/ML IV BOLUS
INTRAVENOUS | Status: DC | PRN
  Administered 2015-05-27: 100 mg via INTRAVENOUS

## 2015-05-27 MED ORDER — ROCURONIUM BROMIDE 100 MG/10ML IV SOLN
INTRAVENOUS | Status: DC | PRN
  Administered 2015-05-27 (×2): 20 mg via INTRAVENOUS
  Administered 2015-05-27: 40 mg via INTRAVENOUS
  Administered 2015-05-27: 20 mg via INTRAVENOUS

## 2015-05-27 MED ORDER — HYDROMORPHONE HCL 2 MG/ML IJ SOLN
INTRAMUSCULAR | Status: AC
  Filled 2015-05-27: qty 1

## 2015-05-27 MED ORDER — BUPIVACAINE-EPINEPHRINE (PF) 0.25% -1:200000 IJ SOLN
INTRAMUSCULAR | Status: DC | PRN
  Administered 2015-05-27: 3 mL

## 2015-05-27 MED ORDER — M.V.I. ADULT IV INJ
INJECTION | INTRAVENOUS | Status: AC
  Administered 2015-05-27: 18:00:00 via INTRAVENOUS
  Filled 2015-05-27: qty 1992

## 2015-05-27 MED ORDER — OXYCODONE HCL 5 MG/5ML PO SOLN
5.0000 mg | Freq: Once | ORAL | Status: DC | PRN
  Filled 2015-05-27: qty 5

## 2015-05-27 MED ORDER — HYDROMORPHONE HCL 1 MG/ML IJ SOLN
0.2500 mg | INTRAMUSCULAR | Status: DC | PRN
  Administered 2015-05-27 (×2): 0.5 mg via INTRAVENOUS

## 2015-05-27 MED ORDER — ONDANSETRON HCL 4 MG/2ML IJ SOLN
INTRAMUSCULAR | Status: DC | PRN
  Administered 2015-05-27: 4 mg via INTRAVENOUS

## 2015-05-27 MED ORDER — SUGAMMADEX SODIUM 200 MG/2ML IV SOLN
INTRAVENOUS | Status: AC
  Filled 2015-05-27: qty 2

## 2015-05-27 MED ORDER — 0.9 % SODIUM CHLORIDE (POUR BTL) OPTIME
TOPICAL | Status: DC | PRN
  Administered 2015-05-27: 2000 mL

## 2015-05-27 MED ORDER — EPHEDRINE SULFATE 50 MG/ML IJ SOLN
INTRAMUSCULAR | Status: AC
  Filled 2015-05-27: qty 1

## 2015-05-27 MED ORDER — ONDANSETRON HCL 4 MG/2ML IJ SOLN
INTRAMUSCULAR | Status: AC
  Filled 2015-05-27: qty 2

## 2015-05-27 MED ORDER — HYDROMORPHONE HCL 1 MG/ML IJ SOLN
INTRAMUSCULAR | Status: AC
  Filled 2015-05-27: qty 1

## 2015-05-27 MED ORDER — ONDANSETRON HCL 4 MG/2ML IJ SOLN: 4.0000 mg | Freq: Four times a day (QID) | INTRAMUSCULAR | Status: DC | PRN

## 2015-05-27 MED ORDER — HYDROMORPHONE HCL 1 MG/ML IJ SOLN
INTRAMUSCULAR | Status: DC | PRN
  Administered 2015-05-27: 1 mg via INTRAVENOUS

## 2015-05-27 MED ORDER — BUPIVACAINE-EPINEPHRINE (PF) 0.25% -1:200000 IJ SOLN
INTRAMUSCULAR | Status: AC
  Filled 2015-05-27: qty 30

## 2015-05-27 MED ORDER — BUPIVACAINE LIPOSOME 1.3 % IJ SUSP
20.0000 mL | Freq: Once | INTRAMUSCULAR | Status: AC
  Administered 2015-05-27: 20 mL
  Filled 2015-05-27: qty 20

## 2015-05-27 MED ORDER — LACTATED RINGERS IR SOLN
Status: DC | PRN
  Administered 2015-05-27: 1000 mL

## 2015-05-27 SURGICAL SUPPLY — 84 items
APPLIER CLIP ROT 10 11.4 M/L (STAPLE)
APR CLP MED LRG 11.4X10 (STAPLE)
BLADE EXTENDED COATED 6.5IN (ELECTRODE) IMPLANT
CABLE HIGH FREQUENCY MONO STRZ (ELECTRODE) ×3 IMPLANT
CELLS DAT CNTRL 66122 CELL SVR (MISCELLANEOUS) ×2 IMPLANT
CLIP APPLIE ROT 10 11.4 M/L (STAPLE) IMPLANT
COVER SURGICAL LIGHT HANDLE (MISCELLANEOUS) ×8 IMPLANT
CUTTER FLEX LINEAR 45M (STAPLE) ×3 IMPLANT
DECANTER SPIKE VIAL GLASS SM (MISCELLANEOUS) ×4 IMPLANT
DRAIN CHANNEL 19F RND (DRAIN) IMPLANT
DRAPE LAPAROSCOPIC ABDOMINAL (DRAPES) ×7 IMPLANT
DRAPE LAPAROTOMY TRNSV 102X78 (DRAPE) ×4 IMPLANT
DRAPE UTILITY XL STRL (DRAPES) ×4 IMPLANT
DRSG OPSITE POSTOP 4X10 (GAUZE/BANDAGES/DRESSINGS) IMPLANT
DRSG OPSITE POSTOP 4X6 (GAUZE/BANDAGES/DRESSINGS) ×3 IMPLANT
DRSG OPSITE POSTOP 4X8 (GAUZE/BANDAGES/DRESSINGS) IMPLANT
ELECT PENCIL ROCKER SW 15FT (MISCELLANEOUS) ×5 IMPLANT
ELECT REM PT RETURN 9FT ADLT (ELECTROSURGICAL) ×8
ELECTRODE REM PT RTRN 9FT ADLT (ELECTROSURGICAL) ×4 IMPLANT
EVACUATOR DRAINAGE 10X20 100CC (DRAIN) IMPLANT
EVACUATOR SILICONE 100CC (DRAIN)
GAUZE SPONGE 4X4 12PLY STRL (GAUZE/BANDAGES/DRESSINGS) ×4 IMPLANT
GLOVE BIOGEL PI IND STRL 7.0 (GLOVE) ×2 IMPLANT
GLOVE BIOGEL PI IND STRL 7.5 (GLOVE) ×4 IMPLANT
GLOVE BIOGEL PI INDICATOR 7.0 (GLOVE) ×2
GLOVE BIOGEL PI INDICATOR 7.5 (GLOVE) ×4
GLOVE ECLIPSE 7.5 STRL STRAW (GLOVE) ×8 IMPLANT
GOWN STRL REUS W/TWL LRG LVL3 (GOWN DISPOSABLE) ×4 IMPLANT
GOWN STRL REUS W/TWL XL LVL3 (GOWN DISPOSABLE) ×24 IMPLANT
HANDLE SUCTION POOLE (INSTRUMENTS) ×3 IMPLANT
KIT BASIN OR (CUSTOM PROCEDURE TRAY) ×4 IMPLANT
LEGGING LITHOTOMY PAIR STRL (DRAPES) ×1 IMPLANT
LIGASURE IMPACT 36 18CM CVD LR (INSTRUMENTS) IMPLANT
LIQUID BAND (GAUZE/BANDAGES/DRESSINGS) ×3 IMPLANT
MARKER SKIN DUAL TIP RULER LAB (MISCELLANEOUS) ×4 IMPLANT
NS IRRIG 1000ML POUR BTL (IV SOLUTION) ×4 IMPLANT
PACK GENERAL/GYN (CUSTOM PROCEDURE TRAY) ×8 IMPLANT
PAD POSITIONING PINK XL (MISCELLANEOUS) IMPLANT
PORT LAP GEL ALEXIS MED 5-9CM (MISCELLANEOUS) IMPLANT
POSITIONER SURGICAL ARM (MISCELLANEOUS) IMPLANT
RELOAD 45 VASCULAR/THIN (ENDOMECHANICALS) ×4 IMPLANT
RELOAD BLUE CHELON 45 (STAPLE) IMPLANT
RELOAD PROXIMATE 75MM BLUE (ENDOMECHANICALS) ×8 IMPLANT
RELOAD STAPLE 45 2.5 WHT GRN (ENDOMECHANICALS) IMPLANT
RELOAD STAPLE 75 3.8 BLU REG (ENDOMECHANICALS) IMPLANT
RELOAD STAPLER LINE PROX 60 GR (STAPLE) ×2 IMPLANT
RETRACTOR WND ALEXIS 18 MED (MISCELLANEOUS) IMPLANT
RTRCTR WOUND ALEXIS 18CM MED (MISCELLANEOUS) ×4
SCISSORS LAP 5X35 DISP (ENDOMECHANICALS) ×7 IMPLANT
SET IRRIG TUBING LAPAROSCOPIC (IRRIGATION / IRRIGATOR) ×4 IMPLANT
SHEARS HARMONIC ACE PLUS 36CM (ENDOMECHANICALS) ×3 IMPLANT
SLEEVE ADV FIXATION 5X100MM (TROCAR) ×8 IMPLANT
SOLUTION ANTI FOG 6CC (MISCELLANEOUS) ×4 IMPLANT
SPONGE LAP 4X18 X RAY DECT (DISPOSABLE) ×4 IMPLANT
STAPLER PROXIMATE 75MM BLUE (STAPLE) ×3 IMPLANT
STAPLER RELOAD LINE PROX 60 GR (STAPLE) ×4
STAPLER RELOADABLE 60 GRN THCK (STAPLE) IMPLANT
STAPLER VISISTAT 35W (STAPLE) ×8 IMPLANT
SUCTION POOLE HANDLE (INSTRUMENTS) ×8
SUT PDS AB 0 CT1 36 (SUTURE) ×3 IMPLANT
SUT PDS AB 1 CTX 36 (SUTURE) IMPLANT
SUT PDS AB 1 TP1 96 (SUTURE) IMPLANT
SUT PROLENE 2 0 KS (SUTURE) IMPLANT
SUT PROLENE 3 0 SH 48 (SUTURE) IMPLANT
SUT SILK 2 0 (SUTURE) ×8
SUT SILK 2 0 SH CR/8 (SUTURE) ×4 IMPLANT
SUT SILK 2-0 18XBRD TIE 12 (SUTURE) ×3 IMPLANT
SUT SILK 3 0 (SUTURE) ×4
SUT SILK 3 0 SH CR/8 (SUTURE) ×4 IMPLANT
SUT SILK 3-0 18XBRD TIE 12 (SUTURE) ×2 IMPLANT
SUT VICRYL 0 UR6 27IN ABS (SUTURE) ×3 IMPLANT
SYS LAPSCP GELPORT 120MM (MISCELLANEOUS)
SYSTEM LAPSCP GELPORT 120MM (MISCELLANEOUS) IMPLANT
TAPE CLOTH 4X10 WHT NS (GAUZE/BANDAGES/DRESSINGS) ×3 IMPLANT
TOWEL OR 17X26 10 PK STRL BLUE (TOWEL DISPOSABLE) ×8 IMPLANT
TOWEL OR NON WOVEN STRL DISP B (DISPOSABLE) ×8 IMPLANT
TRAY FOLEY W/METER SILVER 14FR (SET/KITS/TRAYS/PACK) ×8 IMPLANT
TRAY FOLEY W/METER SILVER 16FR (SET/KITS/TRAYS/PACK) ×8 IMPLANT
TROCAR ADV FIXATION 12X100MM (TROCAR) IMPLANT
TROCAR ADV FIXATION 5X100MM (TROCAR) ×4 IMPLANT
TROCAR BLADELESS OPT 5 100 (ENDOMECHANICALS) ×4 IMPLANT
TROCAR XCEL BLUNT TIP 100MML (ENDOMECHANICALS) ×4 IMPLANT
TUBING INSUF HEATED (TUBING) ×4 IMPLANT
YANKAUER SUCT BULB TIP 10FT TU (MISCELLANEOUS) ×4 IMPLANT

## 2015-05-27 NOTE — Progress Notes (Signed)
Bedside handoff report rec'd from CRNA

## 2015-05-27 NOTE — Anesthesia Postprocedure Evaluation (Signed)
Anesthesia Post Note  Patient: Christian Black  Procedure(s) Performed: Procedure(s): LAPAROSCOPIC ASSISTED RIGHT COLON RESECTION  Patient location during evaluation: PACU Anesthesia Type: General Level of consciousness: awake and alert and patient cooperative Pain management: pain level controlled Vital Signs Assessment: post-procedure vital signs reviewed and stable Respiratory status: spontaneous breathing and respiratory function stable Cardiovascular status: stable Anesthetic complications: no    Last Vitals:  Filed Vitals:   05/27/15 1145 05/27/15 1209  BP:  187/71  Pulse:  80  Temp: 37.1 C 37.2 C  Resp:  16    Last Pain:  Filed Vitals:   05/27/15 1211  PainSc: [place]

## 2015-05-27 NOTE — Progress Notes (Signed)
PARENTERAL NUTRITION CONSULT NOTE - follow up  Pharmacy Consult for TPN Indication: SBO  No Known Allergies  Patient Measurements: Height: 6\' 4"  (193 cm) Weight: 159 lb 3.2 oz (72.213 kg) IBW/kg (Calculated) : 86.8   Vital Signs: Temp: 98.7 F (37.1 C) (02/24 1047) Temp Source: Oral (02/24 0600) BP: 184/81 mmHg (02/24 1100) Pulse Rate: 88 (02/24 1047) Intake/Output from previous day: 02/23 0701 - 02/24 0700 In: 1909.4 [I.V.:363.8; NG/GT:30; IV Piggyback:200; TPN:1315.7] Out: 2205 [Urine:2050; Emesis/NG output:155] Intake/Output from this shift: Total I/O In: 1100 [I.V.:1100] Out: 255 [Urine:230; Blood:25]  Labs: No results for input(s): WBC, HGB, HCT, PLT, APTT, INR in the last 72 hours.   Recent Labs  05/25/15 0340 05/26/15 0403  NA 137 137  K 3.6 3.6  CL 103 103  CO2 26 26  GLUCOSE 113* 107*  BUN 9 14  CREATININE 0.85 0.86  CALCIUM 8.3* 8.4*  MG 1.8 1.9  PHOS 3.9 3.7  PROT  --  6.7  ALBUMIN  --  2.8*  AST  --  18  ALT  --  10*  ALKPHOS  --  42  BILITOT  --  0.8   Estimated Creatinine Clearance: 85.1 mL/min (by C-G formula based on Cr of 0.86).    Recent Labs  05/27/15 05/27/15 0339 05/27/15 1056  GLUCAP 126* 128* 172*    Medical History: Past Medical History  Diagnosis Date  . 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  Significant events:  2/22: unable to perform colonoscopy due to poor prep.  GI recommends surgical intervention for ileocecal mass, then repeat colonoscopy  in a few months post-op. Plan for right colectomy on 2/24. 2/24: colon resection  Today:   Glucose (goal <150): 172 this am  Electrolytes - K 3.6 and Mag 1.9, CoCa, Phos, and other lytes WNL (3/23)  Renal - WNL  LFTs - WNL   TGs - 53 (2/21)  Prealbumin - 15.6 (2/18), 10.7 (2/21)  NUTRITIONAL GOALS  RD recs:  Kcal: 2000-2200 Protein: 90-100 grams  Fluid: >/= 2 L   Clinimix E 5 15/ at a goal rate of 15ml/hr + 20% fat emulsion at 6ml/hr to provide: ~100g/day protein, 1912 Kcal/day.  PLAN                                                                                                  - At 1800 today:  Continue Clinimix E 5/15 to 70ml/hr.  Continue 20% fat emulsion at 6ml/hr.  TPN to contain standard multivitamins and trace elements.  continue IVF at 14ml/hr.  continue moderate SSI q6h   TPN lab panels on Mondays & Thursdays.  F/u daily.  Dolly Rias RPh 05/27/2015, 11:12 AM Pager [phone]

## 2015-05-27 NOTE — Transfer of Care (Signed)
Immediate Anesthesia Transfer of Care Note  Patient: Christian Black  Procedure(s) Performed: Procedure(s): LAPAROSCOPIC ASSISTED RIGHT COLON RESECTION  Patient Location: PACU  Anesthesia Type:General  Level of Consciousness: awake, alert  and oriented  Airway & Oxygen Therapy: Patient Spontanous Breathing and Patient connected to face mask oxygen  Post-op Assessment: Report given to RN and Post -op Vital signs reviewed and stable  Post vital signs: Reviewed and stable  Last Vitals:  Filed Vitals:   05/26/15 2142 05/27/15 0600  BP: 136/65 133/72  Pulse: 73 77  Temp: 37.9 C 37.4 C  Resp: 18 16    Complications: No apparent anesthesia complications

## 2015-05-27 NOTE — Anesthesia Procedure Notes (Signed)
Procedure Name: Intubation Date/Time: 05/27/2015 7:42 AM Performed by: Glory Buff Pre-anesthesia Checklist: Patient identified, Emergency Drugs available, Suction available and Patient being monitored Patient Re-evaluated:Patient Re-evaluated prior to inductionOxygen Delivery Method: Circle System Utilized Preoxygenation: Pre-oxygenation with 100% oxygen Intubation Type: IV induction Ventilation: Mask ventilation without difficulty Laryngoscope Size: Miller and 3 Grade View: Grade I Tube type: Oral Tube size: 7.5 mm Number of attempts: 1 Airway Equipment and Method: Stylet and Oral airway Placement Confirmation: ETT inserted through vocal cords under direct vision,  positive ETCO2 and breath sounds checked- equal and bilateral Secured at: 22 cm Tube secured with: Tape Dental Injury: Teeth and Oropharynx as per pre-operative assessment

## 2015-05-27 NOTE — Op Note (Signed)
Preoperative Diagnosis: Partially obstructing mass ileocecal valve  Postoprative Diagnosis: same  Procedure: Procedure(s): LAPAROSCOPIC ASSISTED RIGHT COLON RESECTION   Surgeon: Excell Seltzer T   Assistants: none  Anesthesia:  General endotracheal anesthesia  Indications: the patient presents with abdominal pain and vomiting and was admitted with a CT scan showing partial small bowel obstruction with an apparent mass versus inflammatory change of the ileocecal valve. With NG decompression and bowel rest his distention and vomiting has resolved. He has been able to receive a gradual bowel prep but colonoscopy was not successful due to poor prep. His abdomen is now soft and nondistended. 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 I discussed the procedure and indications and recovery and potential risks with the patient detailed elsewhere and he agrees.    Procedure Detail:  Patient was brought to the operating room, placed in the supine position on the operating table and general endotracheal anesthesia induced. Foley cath was placed. The abdomen was widely sterilely prepped and draped. He had received preoperative IV antibiotics. Patient timeout was performed and correct procedure verified. Access was obtained with a 1 cm incision in the midline just below the umbilicus with a cannula and pneumoperitoneum established. Under direct vision 5 mm trochars were placed in the right upper quadrant and in the left lower quadrant adjacent to the iliac crest and the left mid abdomen. There was a identifiable mass in the cecum but no evidence of perforation or direct extension. Small bowel loops were mildly to moderately dilated but not interfering with visualization. Initially the cecum and terminal ileum and right colon were extensively mobilized lateral to medial with the harmonic scalpel. The mesentery was dissected up out of the  retroperitoneum and the gonadal vessels and ureter and iliac vessels were clearly identified and protected throughout the dissection. Following this the mesentery of the right colon was incised at its base medially and with careful blunt dissection in the retroperitoneum the ileocolic vessel was isolated and this was divided near its origin with a single firing of the white load 45 mm stapler above the level of the ureter. Following this the hepatic flexure and transverse colon were extensively mobilized with harmonic scalpel. The duodenum had been seen both from the medial inferior dissection and was seen superiorly as well and was carefully protected. The hepatic flexure was completely mobilized back over to the midline and further mobilization of the proximal right colon was performed until the specimen was completely mobile. At this point i made an approximately 5 cm incision extending the hassan cannula site upper in the umbilicus and the wound protector was placed. The specimen was removed with the mass in the cecum barely fitting through the incision but once it was through we had excellent mobility of the terminal ileum and the colon all the way up to the transverse colon. Points of resection of the ileum and transverse colon preserving branches of the middle colic were chosen and these areas were divided with the gia stapler. The remaining mesentery to be divided was taken with clamps and divided and tied with 2-0 silk with the right colic vessels suture ligated with 2-0 silk. The specimen was removed. A functional end-to-end anastomosis was then created with the GIA stapler. Anastomosis was inspected and was without bleeding and widely patent. The common enterotomy was closed with a single firing of the TA 60. The crotch of the anastomosis was reinforced with 2-0 silk. It had  excellent blood supply widely patent and no evidence of leak under pressure. The abdomen was inspected small incision was no  evidence of bleeding or other problems. The viscera were returned to the abdomen in an anatomic orientation. At this point the wound protector was removed and all gloves gowns and instruments were changed prior to closure. The wound was infiltrated with Exparel. The midline fascia was closed with running #1 PDS begun at either end of the incision and tied centrally. Skin was closed with subcuticular Monocryl and Dermabond. Dry sterile dressings were applied. Sponge needle and instrument counts were correct.    Findings: As above  Estimated Blood Loss:  less than 100 mL         Drains: none  Blood Given: none          Specimens: right colon        Complications:  * No complications entered in OR log *         Disposition: PACU - hemodynamically stable.         Condition: stable

## 2015-05-27 NOTE — Anesthesia Preprocedure Evaluation (Addendum)
Anesthesia Evaluation  Patient identified by MRN, date of birth, ID band Patient awake    Reviewed: Allergy & Precautions, NPO status , Patient's Chart, lab work & pertinent test results  Airway Mallampati: II   Neck ROM: full    Dental   Pulmonary Current Smoker,    breath sounds clear to auscultation       Cardiovascular negative cardio ROS   Rhythm:regular Rate:Normal     Neuro/Psych    GI/Hepatic   Endo/Other    Renal/GU      Musculoskeletal   Abdominal   Peds  Hematology   Anesthesia Other Findings   Reproductive/Obstetrics                            Anesthesia Physical Anesthesia Plan  ASA: II  Anesthesia Plan: General   Post-op Pain Management:    Induction: Intravenous  Airway Management Planned: Oral ETT  Additional Equipment:   Intra-op Plan:   Post-operative Plan: Extubation in OR  Informed Consent: I have reviewed the patients History and Physical, chart, labs and discussed the procedure including the risks, benefits and alternatives for the proposed anesthesia with the patient or authorized representative who has indicated his/her understanding and acceptance.     Plan Discussed with: CRNA, Anesthesiologist and Surgeon  Anesthesia Plan Comments:         Anesthesia Quick Evaluation

## 2015-05-27 NOTE — Care Management Important Message (Signed)
Important Message  Patient Details  Name: Christian Black MRN: [id number] Date of Birth: Aug 16, 1945   Medicare Important Message Given:  Yes    [name] 05/27/2015, 1:11 [place] Message  Patient Details  Name: [name] MRN: [id number] Date of Birth: 1946-01-30   Medicare Important Message Given:  Yes    [name] 05/27/2015, 1:10 PM

## 2015-05-27 NOTE — Progress Notes (Signed)
MD notified of increased blood pressure and elevated temp. MD called back and placed order for lopressor. Will continue to encourage use of incentive spirometer.

## 2015-05-28 LAB — GLUCOSE, CAPILLARY
GLUCOSE-CAPILLARY: 119 mg/dL — AB (ref 65–99)
GLUCOSE-CAPILLARY: 156 mg/dL — AB (ref 65–99)
Glucose-Capillary: 137 mg/dL — ABNORMAL HIGH (ref 65–99)
Glucose-Capillary: 139 mg/dL — ABNORMAL HIGH (ref 65–99)
Glucose-Capillary: 113 mg/dL — ABNORMAL HIGH (ref 65–99)

## 2015-05-28 LAB — CBC
HCT: 35.5 % — ABNORMAL LOW (ref 39.0–52.0)
Hemoglobin: 11.7 g/dL — ABNORMAL LOW (ref 13.0–17.0)
MCH: 29.4 pg (ref 26.0–34.0)
MCHC: 33 g/dL (ref 30.0–36.0)
MCV: 89.2 fL (ref 78.0–100.0)
PLATELETS: 241 10*3/uL (ref 150–400)
RBC: 3.98 MIL/uL — AB (ref 4.22–5.81)
RDW: 12.5 % (ref 11.5–15.5)
WBC: 6.1 10*3/uL (ref 4.0–10.5)

## 2015-05-28 MED ORDER — FAT EMULSION 20 % IV EMUL
240.0000 mL | INTRAVENOUS | Status: AC
  Administered 2015-05-28: 240 mL via INTRAVENOUS
  Filled 2015-05-28: qty 250

## 2015-05-28 MED ORDER — TRACE MINERALS CR-CU-MN-SE-ZN 10-1000-500-60 MCG/ML IV SOLN
INTRAVENOUS | Status: AC
  Administered 2015-05-28: 18:00:00 via INTRAVENOUS
  Filled 2015-05-28: qty 1992

## 2015-05-28 MED ORDER — POTASSIUM CHLORIDE IN NACL 20-0.9 MEQ/L-% IV SOLN
INTRAVENOUS | Status: DC
  Administered 2015-05-28 – 2015-05-31 (×3): via INTRAVENOUS
  Filled 2015-05-28 (×5): qty 1000

## 2015-05-28 NOTE — Progress Notes (Signed)
PARENTERAL NUTRITION CONSULT NOTE - follow up  Pharmacy Consult for TPN Indication: SBO  No Known Allergies  Patient Measurements: Height: 6\' 4"  (193 cm) Weight: 159 lb 3.2 oz (72.213 kg) IBW/kg (Calculated) : 86.8   Vital Signs: Temp: 98.9 F (37.2 C) (02/25 0542) Temp Source: Oral (02/25 0542) BP: 141/58 mmHg (02/25 0542) Pulse Rate: 56 (02/25 0542) Intake/Output from previous day: 02/24 0701 - 02/25 0700 In: 2420 [I.V.:2090; NG/GT:30] Out: 3305 [Urine:3180; Emesis/NG output:100; Blood:25] Intake/Output from this shift:    Labs:  Recent Labs  05/28/15 0435  WBC 6.1  HGB 11.7*  HCT 35.5*  PLT 241     Recent Labs  05/26/15 0403  NA 137  K 3.6  CL 103  CO2 26  GLUCOSE 107*  BUN 14  CREATININE 0.86  CALCIUM 8.4*  MG 1.9  PHOS 3.7  PROT 6.7  ALBUMIN 2.8*  AST 18  ALT 10*  ALKPHOS 42  BILITOT 0.8   Estimated Creatinine Clearance: 85.1 mL/min (by C-G formula based on Cr of 0.86).    Recent Labs  05/28/15 0006 05/28/15 0406 05/28/15 0621  GLUCAP 156* 137* 139*    Medical History: Past Medical History  Diagnosis Date  . 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  Significant events:  2/22: unable to perform colonoscopy due to poor prep.  GI recommends surgical intervention for ileocecal mass, then repeat colonoscopy in a few months post-op. Plan for right colectomy on 2/24. 2/24: colon resection  Today:   Glucose (goal <150): most values at goal  Electrolytes - No  labs  Renal - WNL  LFTs - WNL   TGs - 53 (2/21)  Prealbumin - 15.6 (2/18), 10.7 (2/21)  NUTRITIONAL GOALS                                                                                             RD recs:  Kcal: 2000-2200 Protein: 90-100 grams  Fluid: >/= 2 L   Clinimix E 5 15/ at a goal rate of 67ml/hr + 20% fat emulsion at 17ml/hr to provide: ~100g/day protein, 1912 Kcal/day.  PLAN                                                                                                  -  At 1800 today:  Continue Clinimix E 5/15 to 50ml/hr.  Continue 20% fat emulsion at 89ml/hr.  TPN to contain standard multivitamins and trace elements.  continue IVF at 47ml/hr - remove D5W from MIVF 2/25  continue moderate SSI q6h   TPN lab panels on Mondays & Thursdays.  F/u daily.  Doreene Eland, PharmD, BCPS.   Pager: [phone] 05/28/2015 9:18 AM

## 2015-05-28 NOTE — Progress Notes (Signed)
Patient ID: Christian Black, male   DOB: April 27, 1945, 70 y.o.   MRN: [id number] 1 Day Post-Op  Subjective: No major complaints. Occasional incisional pain with motion.  Objective: Vital signs in last 24 hours: Temp:  [98.3 F (36.8 C)-100.5 F (38.1 C)] 98.9 F (37.2 C) (02/25 0542) Pulse Rate:  [56-88] 56 (02/25 0542) Resp:  [16-20] 18 (02/25 0542) BP: (141-194)/(58-85) 141/58 mmHg (02/25 0542) SpO2:  [100 %] 100 % (02/25 0542) Last BM Date: 05/26/15  Intake/Output from previous day: 02/24 0701 - 02/25 0700 In: 2420 [I.V.:2090; NG/GT:30] Out: 3305 [Urine:3180; Emesis/NG output:100; Blood:25] Intake/Output this shift:    General appearance: alert, cooperative, no distress and somewhat drowsy Resp: clear to auscultation bilaterally GI: normal findings: soft, non-tender Incision/Wound: no erythema or drainage  Lab Results:   Recent Labs  05/28/15 0435  WBC 6.1  HGB 11.7*  HCT 35.5*  PLT 241   BMET  Recent Labs  05/26/15 0403  NA 137  K 3.6  CL 103  CO2 26  GLUCOSE 107*  BUN 14  CREATININE 0.86  CALCIUM 8.4*     Studies/Results: No results found.  Anti-infectives: Anti-infectives    Start     Dose/Rate Route Frequency Ordered Stop   05/26/15 0600  cefOXitin (MEFOXIN) 2 g in dextrose 5 % 50 mL IVPB  Status:  Discontinued    Comments:  Pharmacy may adjust dosing strength, interval, or rate of medication as needed for optimal therapy for the patient Send with patient on call to the OR.  Anesthesia to complete antibiotic administration <23min prior to incision per [facility].   2 g 100 mL/hr over 30 Minutes Intravenous On call to O.R. 05/25/15 1321 05/27/15 1223      Assessment/Plan: Obstructing mass ileocecal valve s/p Procedure(s): LAPAROSCOPIC ASSISTED RIGHT COLON RESECTION Doing well postoperatively Discontinue NG tube and Foley catheter. Nothing by mouth except ice chips Out of bed and ambulate   LOS: 8 days    Christian Black  T 05/28/2015

## 2015-05-29 LAB — GLUCOSE, CAPILLARY
GLUCOSE-CAPILLARY: 109 mg/dL — AB (ref 65–99)
GLUCOSE-CAPILLARY: 119 mg/dL — AB (ref 65–99)
GLUCOSE-CAPILLARY: 131 mg/dL — AB (ref 65–99)
Glucose-Capillary: 126 mg/dL — ABNORMAL HIGH (ref 65–99)

## 2015-05-29 LAB — BASIC METABOLIC PANEL
Anion gap: 9 (ref 5–15)
BUN: 17 mg/dL (ref 6–20)
CO2: 29 mmol/L (ref 22–32)
Calcium: 8.8 mg/dL — ABNORMAL LOW (ref 8.9–10.3)
Chloride: 98 mmol/L — ABNORMAL LOW (ref 101–111)
Creatinine, Ser: 0.73 mg/dL (ref 0.61–1.24)
GFR calc Af Amer: >60 mL/min (ref >60)
Glucose, Bld: 120 mg/dL — ABNORMAL HIGH (ref 65–99)
POTASSIUM: 3.9 mmol/L (ref 3.5–5.1)
Sodium: 136 mmol/L (ref 135–145)

## 2015-05-29 MED ORDER — TRACE MINERALS CR-CU-MN-SE-ZN 10-1000-500-60 MCG/ML IV SOLN
INTRAVENOUS | Status: DC
  Administered 2015-05-29: 18:00:00 via INTRAVENOUS
  Filled 2015-05-29: qty 1992

## 2015-05-29 MED ORDER — FAT EMULSION 20 % IV EMUL
240.0000 mL | INTRAVENOUS | Status: DC
  Administered 2015-05-29: 240 mL via INTRAVENOUS
  Filled 2015-05-29: qty 250

## 2015-05-29 NOTE — Progress Notes (Signed)
Patient ID: Christian Black, male   DOB: 1946-03-03, 70 y.o.   MRN: [id number] 2 Days Post-Op  Subjective: No complaints this morning.  Some incisional pain when coughing.  Reports he had a BM  Objective: Vital signs in last 24 hours: Temp:  [98 F (36.7 C)-99.1 F (37.3 C)] 98 F (36.7 C) (02/26 0612) Pulse Rate:  [60-67] 67 (02/26 0612) Resp:  [16-20] 16 (02/26 0612) BP: (146-181)/(54-78) 164/70 mmHg (02/26 0945) SpO2:  [99 %-100 %] 100 % (02/26 0612) Last BM Date: 05/26/15  Intake/Output from previous day: 02/25 0701 - 02/26 0700 In: 2196 [I.V.:1080; TPN:1116] Out: 1450 [Urine:1450] Intake/Output this shift: Total I/O In: -  Out: 400 [Urine:400]  General appearance: alert, cooperative and no distress GI: normal findings: soft, non-tender and mild distention Incision/Wound: Clean and dry  Lab Results:   Recent Labs  05/28/15 0435  WBC 6.1  HGB 11.7*  HCT 35.5*  PLT 241   BMET  Recent Labs  05/29/15 0547  NA 136  K 3.9  CL 98*  CO2 29  GLUCOSE 120*  BUN 17  CREATININE 0.73  CALCIUM 8.8*     Studies/Results: No results found.  Anti-infectives: Anti-infectives    Start     Dose/Rate Route Frequency Ordered Stop   05/26/15 0600  cefOXitin (MEFOXIN) 2 g in dextrose 5 % 50 mL IVPB  Status:  Discontinued    Comments:  Pharmacy may adjust dosing strength, interval, or rate of medication as needed for optimal therapy for the patient Send with patient on call to the OR.  Anesthesia to complete antibiotic administration <45min prior to incision per [facility].   2 g 100 mL/hr over 30 Minutes Intravenous On call to O.R. 05/25/15 1321 05/27/15 1223      Assessment/Plan: s/p Procedure(s): LAPAROSCOPIC ASSISTED RIGHT COLON RESECTION Progressing well Start CL diet    LOS: 9 days    Maitland Lesiak T 05/29/2015

## 2015-05-29 NOTE — Progress Notes (Signed)
PARENTERAL NUTRITION CONSULT NOTE - follow up  Pharmacy Consult for TPN Indication: SBO  No Known Allergies  Patient Measurements: Height: 6\' 4"  (193 cm) Weight: 159 lb 3.2 oz (72.213 kg) IBW/kg (Calculated) : 86.8   Vital Signs: Temp: 98 F (36.7 C) (02/26 0612) Temp Source: Axillary (02/26 0612) BP: 181/78 mmHg (02/26 0612) Pulse Rate: 67 (02/26 0612) Intake/Output from previous day: 02/25 0701 - 02/26 0700 In: 2196 [I.V.:1080; TPN:1116] Out: 1450 [Urine:1450] Intake/Output from this shift: Total I/O In: -  Out: 400 [Urine:400]  Labs:  Recent Labs  05/28/15 0435  WBC 6.1  HGB 11.7*  HCT 35.5*  PLT 241     Recent Labs  05/29/15 0547  NA 136  K 3.9  CL 98*  CO2 29  GLUCOSE 120*  BUN 17  CREATININE 0.73  CALCIUM 8.8*   Estimated Creatinine Clearance: 91.5 mL/min (by C-G formula based on Cr of 0.73).    Recent Labs  05/28/15 1800 05/28/15 2345 05/29/15 0608  GLUCAP 119* 109* 126*    Medical History: Past Medical History  Diagnosis Date  . 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  Significant events:  2/22: unable to perform colonoscopy due to poor prep.  GI recommends surgical intervention for ileocecal mass, then repeat colonoscopy in a few months post-op. Plan for right colectomy on 2/24. 2/24: colon resection 2/25: NGT removed  Today:   Glucose (goal <150): at goal with D5W removed from MIVF 2/25  Electrolytes - WNL  Renal - WNL  LFTs - WNL   TGs - 53  (2/21)  Prealbumin - 15.6 (2/18), 10.7 (2/21)  NUTRITIONAL GOALS                                                                                             RD recs:  Kcal: 2000-2200 Protein: 90-100 grams  Fluid: >/= 2 L   Clinimix E 5 15/ at a goal rate of 65ml/hr + 20% fat emulsion at 70ml/hr to provide: ~100g/day protein, 1912 Kcal/day.  PLAN                                                                                                  -  At 1800 today:  Continue Clinimix E 5/15 at goal 65ml/hr.  Continue 20% fat emulsion at 87ml/hr.  TPN to contain standard multivitamins and trace elements.  continue moderate SSI q6h   TPN lab panels on Mondays & Thursdays.  F/u daily.  Doreene Eland, PharmD, BCPS.   Pager: [phone] 05/29/2015 9:05 AM

## 2015-05-30 ENCOUNTER — Encounter (HOSPITAL_COMMUNITY): Payer: Self-pay | Admitting: General Surgery

## 2015-05-30 LAB — MAGNESIUM: Magnesium: 1.8 mg/dL (ref 1.7–2.4)

## 2015-05-30 LAB — DIFFERENTIAL
Basophils Absolute: 0 10*3/uL (ref 0.0–0.1)
Basophils Relative: 0 %
EOS PCT: 2 %
Eosinophils Absolute: 0.1 10*3/uL (ref 0.0–0.7)
LYMPHS PCT: 13 %
Lymphs Abs: 1.1 10*3/uL (ref 0.7–4.0)
MONO ABS: 0.6 10*3/uL (ref 0.1–1.0)
MONOS PCT: 8 %
Neutro Abs: 6.4 10*3/uL (ref 1.7–7.7)
Neutrophils Relative %: 77 %

## 2015-05-30 LAB — GLUCOSE, CAPILLARY
Glucose-Capillary: 121 mg/dL — ABNORMAL HIGH (ref 65–99)
Glucose-Capillary: 128 mg/dL — ABNORMAL HIGH (ref 65–99)
Glucose-Capillary: 129 mg/dL — ABNORMAL HIGH (ref 65–99)
Glucose-Capillary: 97 mg/dL (ref 65–99)

## 2015-05-30 LAB — CBC
HCT: 36.5 % — ABNORMAL LOW (ref 39.0–52.0)
Hemoglobin: 12 g/dL — ABNORMAL LOW (ref 13.0–17.0)
MCH: 29.3 pg (ref 26.0–34.0)
MCHC: 32.9 g/dL (ref 30.0–36.0)
MCV: 89 fL (ref 78.0–100.0)
PLATELETS: 276 10*3/uL (ref 150–400)
RBC: 4.1 MIL/uL — AB (ref 4.22–5.81)
RDW: 12.8 % (ref 11.5–15.5)
WBC: 8.2 10*3/uL (ref 4.0–10.5)

## 2015-05-30 LAB — COMPREHENSIVE METABOLIC PANEL
ALBUMIN: 2.6 g/dL — AB (ref 3.5–5.0)
ALK PHOS: 125 U/L (ref 38–126)
ALT: 120 U/L — ABNORMAL HIGH (ref 17–63)
AST: 129 U/L — AB (ref 15–41)
Anion gap: 9 (ref 5–15)
BILIRUBIN TOTAL: 0.9 mg/dL (ref 0.3–1.2)
BUN: 17 mg/dL (ref 6–20)
CALCIUM: 8.7 mg/dL — AB (ref 8.9–10.3)
CO2: 27 mmol/L (ref 22–32)
Chloride: 100 mmol/L — ABNORMAL LOW (ref 101–111)
Creatinine, Ser: 0.84 mg/dL (ref 0.61–1.24)
GFR calc Af Amer: >60 mL/min (ref >60)
GFR calc non Af Amer: >60 mL/min (ref >60)
GLUCOSE: 122 mg/dL — AB (ref 65–99)
POTASSIUM: 4 mmol/L (ref 3.5–5.1)
Sodium: 136 mmol/L (ref 135–145)
TOTAL PROTEIN: 6.7 g/dL (ref 6.5–8.1)

## 2015-05-30 LAB — PREALBUMIN: Prealbumin: 10.3 mg/dL — ABNORMAL LOW (ref 18–38)

## 2015-05-30 LAB — PHOSPHORUS: Phosphorus: 4 mg/dL (ref 2.5–4.6)

## 2015-05-30 LAB — TRIGLYCERIDES: TRIGLYCERIDES: 44 mg/dL (ref <150)

## 2015-05-30 MED ORDER — M.V.I. ADULT IV INJ
INJECTION | INTRAVENOUS | Status: AC
  Filled 2015-05-30: qty 1992

## 2015-05-30 MED ORDER — OXYCODONE-ACETAMINOPHEN 5-325 MG PO TABS
1.0000 | ORAL_TABLET | ORAL | Status: DC | PRN
  Administered 2015-05-30 – 2015-05-31 (×3): 2 via ORAL
  Filled 2015-05-30 (×3): qty 2

## 2015-05-30 MED ORDER — PANTOPRAZOLE SODIUM 40 MG PO TBEC
40.0000 mg | DELAYED_RELEASE_TABLET | Freq: Every day | ORAL | Status: DC
  Administered 2015-05-30: 40 mg via ORAL
  Filled 2015-05-30 (×2): qty 1

## 2015-05-30 MED ORDER — FAT EMULSION 20 % IV EMUL
240.0000 mL | INTRAVENOUS | Status: DC
  Filled 2015-05-30: qty 250

## 2015-05-30 MED ORDER — DOCUSATE SODIUM 100 MG PO CAPS
100.0000 mg | ORAL_CAPSULE | Freq: Two times a day (BID) | ORAL | Status: DC
  Administered 2015-05-30 (×2): 100 mg via ORAL
  Filled 2015-05-30 (×4): qty 1

## 2015-05-30 NOTE — Progress Notes (Signed)
Patient ID: Christian Black, male   DOB: 1946/03/28, 70 y.o.   MRN: [id number]     [name]      [name] [place], [place] [address]    Phone: [phone] FAX: [phone]     Subjective: BM yesterday. No n/v. Tolerating clears. Ambulating.  Afebrile.  VSS.    Objective:  Vital signs:  Filed Vitals:   05/29/15 1318 05/29/15 1415 05/29/15 2156 05/30/15 0607  BP: 168/82 147/61 144/81 142/60  Pulse: 63  80 82  Temp: 98.5 F (36.9 C)  100.9 F (38.3 C) 99.5 F (37.5 C)  TempSrc: Oral  Oral Oral  Resp: _0 Height:      Weight:      SpO2: 98%  97% 99%    Last BM Date: 05/30/15  Intake/Output   Yesterday:  02/26 0701 - 02/27 0700 In: 2700.7 [P.O.:240; I.V.:894.8; TPN:1565.9] Out: 1975 [LTRVU:0233] This shift:  Total I/O In: 45 [I.V.:45] Out: -   Physical Exam: General: Pt awake/alert/oriented x4 in no acute distress  Abdomen: Soft.  Mild distention.  Mildly tender at incisions only.  Dressing removed. No evidence of peritonitis.  No incarcerated hernias.    Problem List:   Principal Problem:   SBO (small bowel obstruction) (HCC) Active Problems:   Colonic mass   Acute kidney injury ([place])   Protein-calorie malnutrition, severe    Results:   Labs: Results for orders placed or performed during the hospital encounter of 05/20/15 (from the past 48 hour(s))  Glucose, capillary     Status: Abnormal   Collection Time: 05/28/15 12:07 PM  Result Value Ref Range   Glucose-Capillary 113 (H) 65 - 99 mg/dL  Glucose, capillary     Status: Abnormal   Collection Time: 05/28/15  6:00 PM  Result Value Ref Range   Glucose-Capillary 119 (H) 65 - 99 mg/dL  Glucose, capillary     Status: Abnormal   Collection Time: 05/28/15 11:45 PM  Result Value Ref Range   Glucose-Capillary 109 (H) 65 - 99 mg/dL  Basic metabolic panel     Status: Abnormal   Collection Time: 05/29/15  5:47 AM  Result Value Ref Range   Sodium 136 135 - 145 mmol/L   Potassium 3.9 3.5 - 5.1 mmol/L   Chloride 98 (L) 101 - 111 mmol/L   CO2 29 22 - 32 mmol/L   Glucose, Bld 120 (H) 65 - 99 mg/dL   BUN 17 6 - 20 mg/dL   Creatinine, Ser 0.73 0.61 - 1.24 mg/dL   Calcium 8.8 (L) 8.9 - 10.3 mg/dL   GFR calc non Af Amer >60 >60 mL/min   GFR calc Af Amer >60 >60 mL/min    Comment: (NOTE) The eGFR has been calculated using the CKD EPI equation. This calculation has not been validated in all clinical situations. eGFR's persistently <60 mL/min signify possible Chronic Kidney Disease.    Anion gap 9 5 - 15  Glucose, capillary     Status: Abnormal   Collection Time: 05/29/15  6:08 AM  Result Value Ref Range   Glucose-Capillary 126 (H) 65 - 99 mg/dL  Glucose, capillary     Status: Abnormal   Collection Time: 05/29/15 11:38 AM  Result Value Ref Range   Glucose-Capillary 131 (H) 65 - 99 mg/dL  Glucose, capillary     Status: Abnormal   Collection Time: 05/29/15  6:26 PM  Result Value Ref Range   Glucose-Capillary 119 (  H) 65 - 99 mg/dL  Glucose, capillary     Status: Abnormal   Collection Time: 05/30/15 12:07 AM  Result Value Ref Range   Glucose-Capillary 128 (H) 65 - 99 mg/dL  Glucose, capillary     Status: Abnormal   Collection Time: 05/30/15  6:04 AM  Result Value Ref Range   Glucose-Capillary 121 (H) 65 - 99 mg/dL  Comprehensive metabolic panel     Status: Abnormal   Collection Time: 05/30/15  7:00 AM  Result Value Ref Range   Sodium 136 135 - 145 mmol/L   Potassium 4.0 3.5 - 5.1 mmol/L   Chloride 100 (L) 101 - 111 mmol/L   CO2 27 22 - 32 mmol/L   Glucose, Bld 122 (H) 65 - 99 mg/dL   BUN 17 6 - 20 mg/dL   Creatinine, Ser 0.84 0.61 - 1.24 mg/dL   Calcium 8.7 (L) 8.9 - 10.3 mg/dL   Total Protein 6.7 6.5 - 8.1 g/dL   Albumin 2.6 (L) 3.5 - 5.0 g/dL   AST 129 (H) 15 - 41 U/L   ALT 120 (H) 17 - 63 U/L   Alkaline Phosphatase 125 38 - 126 U/L   Total Bilirubin 0.9 0.3 - 1.2 mg/dL   GFR calc non Af Amer >60 >60 mL/min    GFR calc Af Amer >60 >60 mL/min    Comment: (NOTE) The eGFR has been calculated using the CKD EPI equation. This calculation has not been validated in all clinical situations. eGFR's persistently <60 mL/min signify possible Chronic Kidney Disease.    Anion gap 9 5 - 15  Magnesium     Status: None   Collection Time: 05/30/15  7:00 AM  Result Value Ref Range   Magnesium 1.8 1.7 - 2.4 mg/dL  Phosphorus     Status: None   Collection Time: 05/30/15  7:00 AM  Result Value Ref Range   Phosphorus 4.0 2.5 - 4.6 mg/dL  CBC     Status: Abnormal   Collection Time: 05/30/15  7:00 AM  Result Value Ref Range   WBC 8.2 4.0 - 10.5 K/uL   RBC 4.10 (L) 4.22 - 5.81 MIL/uL   Hemoglobin 12.0 (L) 13.0 - 17.0 g/dL   HCT 36.5 (L) 39.0 - 52.0 %   MCV 89.0 78.0 - 100.0 fL   MCH 29.3 26.0 - 34.0 pg   MCHC 32.9 30.0 - 36.0 g/dL   RDW 12.8 11.5 - 15.5 %   Platelets 276 150 - 400 K/uL  Differential     Status: None   Collection Time: 05/30/15  7:00 AM  Result Value Ref Range   Neutrophils Relative % 77 %   Neutro Abs 6.4 1.7 - 7.7 K/uL   Lymphocytes Relative 13 %   Lymphs Abs 1.1 0.7 - 4.0 K/uL   Monocytes Relative 8 %   Monocytes Absolute 0.6 0.1 - 1.0 K/uL   Eosinophils Relative 2 %   Eosinophils Absolute 0.1 0.0 - 0.7 K/uL   Basophils Relative 0 %   Basophils Absolute 0.0 0.0 - 0.1 K/uL    Imaging / Studies: No results found.  Medications / Allergies:  Scheduled Meds: . chlorhexidine  15 mL Mouth/Throat BID  . colchicine  0.6 mg Oral Daily  . enoxaparin (LOVENOX) injection  40 mg Subcutaneous Q24H  . insulin aspart  0-15 Units Subcutaneous 4 times per day  . pantoprazole (PROTONIX) IV  40 mg Intravenous QHS   Continuous Infusions: . 0.9 % NaCl with KCl 20  mEq / L 45 mL/hr at 05/29/15 0738  . Marland KitchenTPN (CLINIMIX-E) Adult 83 mL/hr at 05/29/15 1737   And  . fat emulsion 240 mL (05/29/15 1736)   PRN Meds:.menthol-cetylpyridinium, metoprolol, morphine injection, ondansetron **OR** ondansetron  (ZOFRAN) IV, sodium chloride flush  Antibiotics: Anti-infectives    Start     Dose/Rate Route Frequency Ordered Stop   05/26/15 0600  cefOXitin (MEFOXIN) 2 g in dextrose 5 % 50 mL IVPB  Status:  Discontinued    Comments:  Pharmacy may adjust dosing strength, interval, or rate of medication as needed for optimal therapy for the patient Send with patient on call to the OR.  Anest[facility] complete antibiotic administration [facility]68mn [facility]   2 g 100 mL/hr over 30 Minutes Intravenous On call to O.R. 05/25/15 1321 05/27/15 1223        Assessment/Plan Partially obstructing ileocecal mass POD#3 laparoscopic assisted right colon resection---Dr. HExcell Seltzer-advance to fulls -mobilize, IS pain control PCM-wean off TPN  VTE prophylaxis-SCD/lovenox  Gout-colchicine  Dispo-anticipate DC 1-2 days   EErby Pian ACherokee Indian Hospital AuthoritySurgery Pager [phone]([id number]:30P)   05/30/2015 8:20 AM

## 2015-05-30 NOTE — Progress Notes (Signed)
05/30/15  1720  Notified IV team RN that PICC line dressing was due to be changed today. Per RN she states she had done it for today. No PICC dressing change has been documented for 05/30/15 and the date on the patients dressing is 05/30/15.

## 2015-05-30 NOTE — Progress Notes (Signed)
PARENTERAL NUTRITION CONSULT NOTE - follow up  Pharmacy Consult for TPN Indication: SBO  No Known Allergies  Patient Measurements: Height: 6\' 4"  (193 cm) Weight: 159 lb 3.2 oz (72.213 kg) IBW/kg (Calculated) : 86.8   Vital Signs: Temp: 99.5 F (37.5 C) (02/27 0607) Temp Source: Oral (02/27 0607) BP: 142/60 mmHg (02/27 0607) Pulse Rate: 82 (02/27 0607) Intake/Output from previous day: 02/26 0701 - 02/27 0700 In: 2700.7 [P.O.:240; I.V.:894.8; TPN:1565.9] Out: 1975 [id number] Intake/Output from this shift: Total I/O In: 45 [I.V.:45] Out: -   Labs:  Recent Labs  05/28/15 0435 05/30/15 0700  WBC 6.1 8.2  HGB 11.7* 12.0*  HCT 35.5* 36.5*  PLT 241 276     Recent Labs  05/29/15 0547 05/30/15 0700  NA 136 136  K 3.9 4.0  CL 98* 100*  CO2 29 27  GLUCOSE 120* 122*  BUN 17 17  CREATININE 0.73 0.84  CALCIUM 8.8* 8.7*  MG  --  1.8  PHOS  --  4.0  PROT  --  6.7  ALBUMIN  --  2.6*  AST  --  129*  ALT  --  120*  ALKPHOS  --  125  BILITOT  --  0.9  TRIG  --  44   Estimated Creatinine Clearance: 87.1 mL/min (by C-G formula based on Cr of 0.84).    Recent Labs  05/29/15 1826 05/30/15 0007 05/30/15 0604  GLUCAP 119* 128* 121*    Medical History: Past Medical History  Diagnosis Date  . 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  Significant events:  2/22: unable to perform colonoscopy due to poor prep.  GI  recommends surgical intervention for ileocecal mass, then repeat colonoscopy in a few months post-op. Plan for right colectomy on 2/24. 2/24: colon resection 2/25: NGT removed  Today:   Glucose (goal <150): at goal with D5W removed from MIVF 2/25  Electrolytes - WNL  Renal - WNL  LFTs - AST/ALT increased  TGs - 53 (2/21), 44 (2/27)  Prealbumin - 15.6 (2/18), 10.7 (2/21), today's level in process  NUTRITIONAL GOALS  RD recs:  Kcal: 2000-2200 Protein: 90-100 grams  Fluid: >/= 2 L   Clinimix E 5 15/ at a goal rate of 95ml/hr + 20% fat emulsion at 51ml/hr to provide: ~100g/day protein, 1912 Kcal/day.  PLAN                                                                                                  Per surgery, wean off TPN today.  This morning, reduce Clinimix E 5/15 to half rate at 40 ml/hr x 2 hours then discontinue TPN.  Discontinue 20% fat emulsion.  Discontinue SSI and CBG checks.    Discontinue TPN labs.  Hershal Coria, PharmD, BCPS Pager: [phone] 05/30/2015 9:31 AM

## 2015-05-30 NOTE — Progress Notes (Signed)
Nutrition Follow-up  DOCUMENTATION CODES:   Severe malnutrition in context of acute illness/injury  INTERVENTION:   -TPN per Pharmacy -Ordered lunch tray for patient -Provide Magic cup TID with meals, each supplement provides 290 kcal and 9 grams of protein -Encourage PO intake -RD to continue to monitor  NUTRITION DIAGNOSIS:   Inadequate oral intake related to inability to eat as evidenced by NPO status. -now on liquid diet.  GOAL:   Patient will meet greater than or equal to 90% of their needs  Progressing.  MONITOR:   PO intake, Supplement acceptance, Diet advancement, Labs, Weight trends, I & O's, Other (Comment) (TPN)   ASSESSMENT:   Pt states he had the abrupt onset of abdominal pain with distention followed by nausea and vomiting. He still having some bowel movements and passing gas. Because of the pain and persistent vomiting he presented to the emergency department. 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 I was asked to see him because of this. He has not seen a physician in many years. He has never had a colonoscopy. He denies any family history of gastrointestinal cancer. He states he has lost some weight. He cannot quantitate the weight loss. He drinks alcohol daily (no more than 2 beers).  Patient in room with no family at bedside. Pt reports consuming juice, coffee, broth and jello this morning. His diet has been advanced to full liquids. Pt reports fluctuating appetite but he wouldn't mind some ice cream. RD ordered lunch tray for patient, with some magic cups, juice, soda, and cream soup. Will monitor for tolerance.  Plan per Pharmacy 2/27: Per surgery, wean off TPN today.  This morning, reduce Clinimix E 5/15 to half rate at 40 ml/hr x 2 hours then discontinue TPN.  Discontinue 20% fat emulsion.  Labs reviewed: CBGs: [id number] Mg/Phos/K WNL  Diet Order:  Diet  full liquid Room service appropriate?: Yes; Fluid consistency:: Thin  Skin:  Reviewed, no issues  Last BM:  2/27  Height:   Ht Readings from Last 1 Encounters:  05/20/15 6\' 4"  (1.93 m)    Weight:   Wt Readings from Last 1 Encounters:  05/20/15 159 lb 3.2 oz (72.213 kg)    Ideal Body Weight:  91.8 kg  BMI:  Body mass index is 19.39 kg/(m^2).  Estimated Nutritional Needs:   Kcal:  2000-2200  Protein:  90-100 grams   Fluid:  >/= 2 L   EDUCATION NEEDS:   No education needs identified at this time  Clayton Bibles, MS, RD, LDN Pager: [phone] After Hours Pager: [phone]

## 2015-05-30 NOTE — Progress Notes (Signed)

## 2015-05-31 LAB — GLUCOSE, CAPILLARY: GLUCOSE-CAPILLARY: 97 mg/dL (ref 65–99)

## 2015-05-31 MED ORDER — COLCHICINE 0.6 MG PO TABS: 0.6000 mg | ORAL_TABLET | Freq: Every day | ORAL | Status: DC

## 2015-05-31 MED ORDER — DOCUSATE SODIUM 100 MG PO CAPS: 100.0000 mg | ORAL_CAPSULE | Freq: Two times a day (BID) | ORAL | Status: DC

## 2015-05-31 MED ORDER — OXYCODONE-ACETAMINOPHEN 5-325 MG PO TABS: 1.0000 | ORAL_TABLET | ORAL | Status: DC | PRN

## 2015-05-31 MED ORDER — HEPARIN SOD (PORK) LOCK FLUSH 100 UNIT/ML IV SOLN
250.0000 [IU] | INTRAVENOUS | Status: DC | PRN
  Administered 2015-05-31: 500 [IU]

## 2015-05-31 NOTE — Discharge Summary (Signed)
Physician Discharge Summary  MEMPHIS [name] DOB: [birth year] DOA: 05/20/2015  PCP: No primary care provider on file.  Consultation: gastroenterology--Dr. Elvin So  Admit date: 05/20/2015 Discharge date: 05/31/2015  Recommendations for Outpatient Follow-up:   Follow-up Information    Follow up with HOXWORTH,BENJAMIN T, MD In 2 weeks.   Specialty:  General Surgery   Why:  post operative check up   Contact information:   [place] [address] [phone]      Discharge Diagnoses:  1. Adenocarcinoma of colon 2. PCM 3. Gout   Surgical Procedure: laparoscopic assisted right colon resection---Dr. Excell Seltzer  Discharge Condition: stable Disposition: home  Diet recommendation: regular  Filed Weights   05/20/15 2124  Weight: 72.213 kg (159 lb 3.2 oz)    Filed Vitals:   05/30/15 1952 05/31/15 0537  BP: 142/73 138/71  Pulse: 81 74  Temp: 99 F (37.2 C) 98.8 F (37.1 C)  Resp: 16 15      Hospital Course:  Christian Black is a 70 year old male who presented to [facility] With abdominal pain, nausea and vomiting.  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  He never had a colonoscopy.  He was admitted for IV hydration, NGT decompression and a colonoscopy.  Unfortunately, the colonoscopy was unsuccessful.  The patient was started on TPN in the interim.  He subsequently underwent a laparoscopic assisted right colon resection.  Post operatively, he remained stable.  Diet was advanced and he was mobilized.  TPN was stopped as diet was advanced.  Kept on lovenox for VTE prophylaxis.  Colchicine was resumed for gout flare.  Pathology revealed adenocarcinoma of the colon with negative margins and 7/7 negative lymph nodes.  I discussed this with the patient and his daughter, Christian Black.  We obtained a CEA and a chest CT for staging.   On POD#4 the patient was felt stable  for discharge home.  Medication risks, benefits and therapeutic alternatives were reviewed with the patient.  He verbalizes understanding.   Physical Exam: General: Pt awake/alert/oriented x4 in no acute distress  Abdomen: Soft. non distended. Mildly tender at incisions only. Dressing removed. No evidence of peritonitis. No incarcerated hernias.  Discharge Instructions     Medication List    TAKE these medications        colchicine 0.6 MG tablet  Take 1 tablet (0.6 mg total) by mouth daily.     docusate sodium 100 MG capsule  Commonly known as:  COLACE  Take 1 capsule (100 mg total) by mouth 2 (two) times daily.     oxyCODONE-acetaminophen 5-325 MG tablet  Commonly known as:  PERCOCET/ROXICET  Take 1-2 tablets by mouth every 4 (four) hours as needed for moderate pain or severe pain.           Follow-up Information    Follow up with HOXWORTH,BENJAMIN T, MD In 2 weeks.   Specialty:  General Surgery   Why:  post operative check up   Contact information:   [place] [address] [phone]        The results of significant diagnostics from this hospitalization (including imaging, microbiology, ancillary and laboratory) are listed below for reference.    Significant Diagnostic Studies: Dg Chest 2 View  05/21/2015  CLINICAL DATA:  Abdominal distension and n/v since yesterday; no specific chest complaints today; f/u colon tumor per  ordering MD; no known cardiopulmonary problems; smoker EXAM: CHEST  2 VIEW COMPARISON:  None. FINDINGS: Lungs are hyperexpanded but clear. No pleural effusion or pneumothorax. Cardiac silhouette is normal in size. No mediastinal or hilar masses or evidence of adenopathy. Oral/nasogastric to has its tip projecting chest through the GE junction. Recommend further insertion of at least 10 cm for more optimal positioning. There old left-sided rib fractures. IMPRESSION: 1. No acute cardiopulmonary disease. 2. Tip of the  oral/nasogastric tube chest passes through the GE junction. Recommend further insertion. Electronically Signed   By: Lajean Manes M.D.   On: 05/21/2015 09:14   Ct Abdomen Pelvis W Contrast  05/20/2015  CLINICAL DATA:  Acute generalized abdominal pain. EXAM: CT ABDOMEN AND PELVIS WITH CONTRAST TECHNIQUE: Multidetector CT imaging of the abdomen and pelvis was performed using the standard protocol following bolus administration of intravenous contrast. CONTRAST:  27mL OMNIPAQUE IOHEXOL 300 MG/ML SOLN, 175mL OMNIPAQUE IOHEXOL 300 MG/ML SOLN COMPARISON:  None. FINDINGS: Severe degenerative disc disease is noted at L5-S1. Visualized lung bases are unremarkable. No gallstones are noted. The liver, spleen and pancreas are unremarkable. Adrenal glands appear normal. Bilateral renal cortical scarring is noted. Simple cyst is seen involving the lower pole of left kidney. No hydronephrosis or renal obstruction is noted. The appendix appears normal. Severe small bowel dilatation is noted. This appears to be due to irregular and enhancing abnormality seen involving the ileocecal valve and cecum, which is concerning for possible colonic malignancy. No abnormal fluid collection is noted. Urinary bladder appears normal. No significant adenopathy is noted. Atherosclerosis of abdominal aorta is noted without aneurysm formation. IMPRESSION: There is a irregular and enhancing soft tissue abnormality involving the ileocecal valve and cecum which is resulting and small bowel obstruction. This is highly concerning for colonic malignancy, and colonoscopy is recommended for further evaluation. It is possible that this represents inflammation from inflammatory bowel disease such as Crohn's disease, but that is felt to be less likely. Electronically Signed   By: Marijo Conception, M.D.   On: 05/20/2015 16:53   Dg Abd 2 Views  05/22/2015  CLINICAL DATA:  ADMITTED TO HOSPITAL YESTERDAY FOR ABD PAIN X 1 WK, NG TUBE AND ENEMA TREATMENT LAST  NIGHT, PT DENIES ABD PAIN, C/O WEAKNESS EXAM: ABDOMEN - 2 VIEW COMPARISON:  05/21/2015 FINDINGS: Nasogastric tube is in place, side port overlying the level of the gastroesophageal junction. There is dilatation of small bowel loops, associated with air-fluid levels on the erect view. Contrast is identified within nondilated loops of colon. Colonic diverticula are present. IMPRESSION: Persistent small bowel obstruction. Electronically Signed   By: Nolon Nations M.D.   On: 05/22/2015 10:40   Dg Abd Portable 2v  05/21/2015  CLINICAL DATA:  70 year old male with abdominal distention, nausea and vomiting EXAM: PORTABLE ABDOMEN - 2 VIEW COMPARISON:  CT abdomen/ pelvis 05/20/2015 FINDINGS: A nasogastric tube is present. The tip overlies the gastric bubble in the proximal side hole the gastroesophageal junction. Multiple loops of dilated and air filled small bowel in the mid and left abdomen consistent with distal small bowel obstruction. No evidence of free air. Moderate volume of formed stool overlies the rectum. No acute osseous abnormality. 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 IMPRESSION: 1. Persistent small bowel obstruction. Maximal small bowel diameter 4.5 cm. 2. The tip of the nasogastric tube overlies the gastric bubble while the proximal side hole overlies the gastroesophageal junction. Consider advancing a a few cm for  more optimal position of the proximal side hole within the stomach. Electronically Signed   By: Jacqulynn Cadet M.D.   On: 05/21/2015 09:15    Microbiology: No results found for this or any previous visit (from the past 240 hour(s)).   Labs: Basic Metabolic Panel:  Recent Labs Lab 05/25/15 0340 05/26/15 0403 05/29/15 0547 05/30/15 0700  NA 137 137 136 136  K 3.6 3.6 3.9 4.0  CL 103 103 98* 100*  CO2 26 26 29 27   GLUCOSE 113* 107* 120* 122*  BUN 9 14 17 17   CREATININE 0.85 0.86 0.73 0.84  CALCIUM 8.3* 8.4* 8.8*  8.7*  MG 1.8 1.9  --  1.8  PHOS 3.9 3.7  --  4.0   Liver Function Tests:  Recent Labs Lab 05/26/15 0403 05/30/15 0700  AST 18 129*  ALT 10* 120*  ALKPHOS 42 125  BILITOT 0.8 0.9  PROT 6.7 6.7  ALBUMIN 2.8* 2.6*   No results for input(s): LIPASE, AMYLASE in the last 168 hours. No results for input(s): AMMONIA in the last 168 hours. CBC:  Recent Labs Lab 05/28/15 0435 05/30/15 0700  WBC 6.1 8.2  NEUTROABS  --  6.4  HGB 11.7* 12.0*  HCT 35.5* 36.5*  MCV 89.2 89.0  PLT 241 276   Cardiac Enzymes: No results for input(s): CKTOTAL, CKMB, CKMBINDEX, TROPONINI in the last 168 hours. BNP: BNP (last 3 results) No results for input(s): BNP in the last 8760 hours.  ProBNP (last 3 results) No results for input(s): PROBNP in the last 8760 hours.  CBG:  Recent Labs Lab 05/30/15 0007 05/30/15 0604 05/30/15 1152 05/30/15 1817 05/31/15 0013  GLUCAP 128* 121* 129* 97 97    Principal Problem:   Mass of colon s/p lap assisated right colon resection 05/27/15 Active Problems:   Colonic mass   Acute kidney injury ([place])   Protein-calorie malnutrition, severe   Time coordinating discharge: <30 mins   Signed:  Makani Seckman, ANP-BC

## 2015-05-31 NOTE — Progress Notes (Signed)
Pt tolerating diet, pain is under under control and vitals are WNL. Discussed discharge instructions with patient. Discharged to home with prescription.

## 2015-06-01 LAB — CEA: CEA: 4.3 ng/mL (ref 0.0–4.7)

## 2015-06-02 ENCOUNTER — Other Ambulatory Visit: Payer: Self-pay | Admitting: General Surgery

## 2015-06-02 ENCOUNTER — Telehealth: Payer: Self-pay | Admitting: *Deleted

## 2015-06-02 ENCOUNTER — Encounter: Payer: Self-pay | Admitting: *Deleted

## 2015-06-02 DIAGNOSIS — C189 Malignant neoplasm of colon, unspecified: Secondary | ICD-10-CM

## 2015-06-02 NOTE — Telephone Encounter (Signed)
Oncology Nurse Navigator Documentation  Oncology Nurse Navigator Flowsheets 06/02/2015  Navigator Location [facility]  Navigator Encounter Type Introductory phone call  Abnormal Finding Date 05/20/2015  Confirmed Diagnosis Date 05/27/2015  Surgery Date 05/27/2015  Spoke with daughter, Christian Black and provided new patient appointment for 06/09/15 at 11:00 with Dr. Burr Medico. Informed of location of [place], valet service, and registration process. Reminded to bring insurance cards and a current medication list, including supplements. Patient verbalizes understanding.Welcome packet mailed to home.

## 2015-06-07 ENCOUNTER — Encounter (HOSPITAL_COMMUNITY): Payer: Self-pay

## 2015-06-09 ENCOUNTER — Telehealth: Payer: Self-pay | Admitting: Hematology

## 2015-06-09 ENCOUNTER — Encounter: Payer: Self-pay | Admitting: Hematology

## 2015-06-09 ENCOUNTER — Ambulatory Visit (HOSPITAL_BASED_OUTPATIENT_CLINIC_OR_DEPARTMENT_OTHER): Payer: Medicare Other | Admitting: Hematology

## 2015-06-09 ENCOUNTER — Encounter: Payer: Self-pay | Admitting: *Deleted

## 2015-06-09 VITALS — BP 169/88 | HR 57 | Temp 98.5°F | Resp 17 | Ht 76.0 in | Wt 157.3 lb

## 2015-06-09 DIAGNOSIS — C182 Malignant neoplasm of ascending colon: Secondary | ICD-10-CM

## 2015-06-09 DIAGNOSIS — R030 Elevated blood-pressure reading, without diagnosis of hypertension: Secondary | ICD-10-CM | POA: Diagnosis not present

## 2015-06-09 NOTE — Telephone Encounter (Signed)
Gave and printed appt sched and avs for pt for April and June

## 2015-06-09 NOTE — Progress Notes (Signed)
[place]  Telephone:([phone]) [phone] Fax:(336) [phone]  Clinic New Consult Note   No care team member to display 06/09/2015  Referring physician: Dr. Excell Seltzer   CHIEF COMPLAINTS/PURPOSE OF CONSULTATION:  Newly diagnosed stage II colon cancer  Oncology History   Presented to emergency department with abrupt abdominal pain, distention and N/V.     Colon cancer, ascending ([place])   05/20/2015 Imaging CT ABD/PELVIS: Ileocecal valve/cecum wall thickening; no evidence of mets   05/25/2015 Procedure Colonoscopy: Prep quality was poor, with solid impenetrable stool obscuring our views, and making passage of colonoscope proximal to the sigmoid colon impossible. There were a few benign-appearing polyps seen in the rectosigmoid colon   05/27/2015 Pathology Results MMR by IHC normal   05/27/2015 Pathologic Stage T3 N0 MX-Moderately differentiated; Invades through muscularis propria; 0/17 nodes were +   05/27/2015 Initial Diagnosis Colon cancer, ascending ([place])   05/27/2015 Definitive Surgery Laparoscopic assisted right colon resection--Dr. Zella Richer   05/31/2015 Tumor Marker CEA = 4.3    HISTORY OF PRESENTING ILLNESS:  Christian Black 70 y.o. male is here because of his recently diagnosed  II colon cancer. He is accompanied by his Ex-wife to the clinic today.  Christian Black is a 70 year old male who has not seen a physician, presented to [facility] With abdominal pain, nausea and vomiting on 05/20/2015. He denies any significant before this episode except some weight loss.  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 He never had a colonoscopy. He was seen by GI Dr. Paulita Fujita and underwent a colonoscopy. Unfortunately, the colonoscopy was unsuccessful due to the poor prep. The patient was started on TPN in the interim. He subsequently underwent a laparoscopic assisted right colon resection. he was  discharged home on 05/31/2015.   He is recovering well from the surgery. He has good appetite and eats well. His BM is normal. He still has soreness at the incision, he has not need any pain meds.   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 He is divorced, has 4 children, lives with a friend. He has chronic intermittent back pain since his surgery in 1983.    MEDICAL HISTORY:  Past Medical History  Diagnosis Date  . Medical history non-contributory     SURGICAL HISTORY: Past Surgical History  Procedure Laterality Date  . Back surgery    . Colonoscopy with propofol N/A 05/25/2015    Procedure: COLONOSCOPY WITH PROPOFOL;  Surgeon: Arta Silence, MD;  Location: [facility] ENDOSCOPY;  Service: Endoscopy;  Laterality: N/A;  . Colon resection  05/27/2015    Procedure: LAPAROSCOPIC ASSISTED RIGHT COLON RESECTION;  Surgeon: Excell Seltzer, MD;  Location: [facility] ORS;  Service: General;;    SOCIAL HISTORY: Social History   Social History  . Marital Status: Single    Spouse Name: N/A  . Number of Children: N/A  . Years of Education: N/A   Occupational History  . Not on file.   Social History Main Topics  . Smoking status: Current Every Day Smoker -- 0.50 packs/day for 30 years    Types: Cigarettes  . Smokeless tobacco: Never Used  . Alcohol Use: Yes     Comment: 1 can beer per day  . Drug Use: Yes    Special: Marijuana  . Sexual Activity: Not on file   Other Topics Concern  . 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   FAMILY HISTORY: Family History  Problem Relation Age of Onset  . Cancer Mother 38    ovarian cancer   . Cancer Father 77    pancreatic cancer   . Cancer Maternal Aunt 50    lung cancer  . Cancer Maternal Uncle     lung cancer    ALLERGIES:  has No Known  Allergies.  MEDICATIONS:  Current Outpatient Prescriptions  Medication Sig Dispense Refill  . colchicine 0.6 MG tablet Take 1 tablet (0.6 mg total) by mouth daily. (Patient not taking: Reported on 06/09/2015)    . docusate sodium (COLACE) 100 MG capsule Take 1 capsule (100 mg total) by mouth 2 (two) times daily. (Patient not taking: Reported on 06/09/2015) 10 capsule 0  . oxyCODONE-acetaminophen (PERCOCET/ROXICET) 5-325 MG tablet Take 1-2 tablets by mouth every 4 (four) hours as needed for moderate pain or severe pain. (Patient not taking: Reported on 06/09/2015) 40 tablet 0   No current facility-administered medications for this visit.    REVIEW OF SYSTEMS:   Constitutional: Denies fevers, chills or abnormal night sweats Eyes: Denies blurriness of vision, double vision or watery eyes Ears, nose, mouth, throat, and face: Denies mucositis or sore throat Respiratory: Denies cough, dyspnea or wheezes Cardiovascular: Denies palpitation, chest discomfort or lower extremity swelling Gastrointestinal:  Denies nausea, heartburn or change in bowel habits Skin: Denies abnormal skin rashes Lymphatics: Denies new lymphadenopathy or easy bruising Neurological:Denies numbness, tingling or new weaknesses Behavioral/Psych: Mood is stable, no new changes  All other systems were reviewed with the patient and are negative.  PHYSICAL EXAMINATION: ECOG PERFORMANCE STATUS: 1 - Symptomatic but completely ambulatory  Filed Vitals:   06/09/15 1114  BP: 169/88  Pulse: 57  Temp: 98.5 F (36.9 C)  Resp: 17   Filed Weights   06/09/15 1114  Weight: 157 lb 4.8 oz (71.351 kg)    GENERAL:alert, no distress and comfortable SKIN: skin color, texture, turgor are normal, no rashes or significant lesions EYES: normal, conjunctiva are pink and non-injected, sclera clear OROPHARYNX:no exudate, no erythema and lips, buccal mucosa, and tongue normal  NECK: supple, thyroid normal size, non-tender, without  nodularity LYMPH:  no palpable lymphadenopathy in the cervical, axillary or inguinal LUNGS: clear to auscultation and percussion with normal breathing effort HEART: regular rate & rhythm and no murmurs and no lower extremity edema ABDOMEN:abdomen soft, non-tender and normal bowel sounds Musculoskeletal:no cyanosis of digits and no clubbing  PSYCH: alert & oriented x 3 with fluent speech NEURO: no focal motor/sensory deficits  LABORATORY DATA:  I have reviewed the data as listed Lab Results  Component Value Date   WBC 8.2 05/30/2015   HGB 12.0* 05/30/2015   HCT 36.5* 05/30/2015   MCV 89.0 05/30/2015   PLT 276 05/30/2015    Recent Labs  05/24/15 0420  05/26/15 0403 05/29/15 0547 05/30/15 0700  NA 139  < > 137 136 136  K 3.6  < > 3.6 3.9 4.0  CL 107  < > 103 98* 100*  CO2 26  < > 26 29 27   GLUCOSE 128*  < > 107* 120* 122*  BUN 7  < > 14 17 17   CREATININE 0.94  < > 0.86 0.73 0.84  CALCIUM 8.8*  < > 8.4* 8.8* 8.7*  GFRNONAA >60  < > >60 >60 >60  GFRAA >60  < > >60 >60 >60  PROT 6.8  --  6.7  --  6.7  ALBUMIN 3.1*  --  2.8*  --  2.6*  AST 15  --  18  --  129*  ALT 6*  --  10*  --  120*  ALKPHOS 42  --  42  --  125  BILITOT 0.9  --  0.8  --  0.9  < > = values in this interval not displayed.  CEA  Status: Finalresult Visible to patient:  Not Released Nextappt: Today at 11:00 AM in Oncology [facility], Krista Blue, MD)         Ref Range 9d ago    CEA 0.0 - 4.7 ng/mL 4.3         PATHOLOGY REPORT: Diagnosis 05/27/2015 Colon, segmental resection for tumor, right - INVASIVE ADENOCARCINOMA, MODERATELY DIFFERENTIATED, SPANNING 7.5 CM. - TUMOR INVADES THROUGH MUSCULARIS PROPRIA INTO SUBSEROSAL TISSUE. - RESECTION MARGINS ARE NEGATIVE. - SEVENTEEN OF SEVENTEEN LYMPH NODES ARE NEGATIVE FOR CARCINOMA (0/17). - TUBULAR ADENOMA (X2). - BENIGN APPENDIX. - SEE ONCOLOGY TABLE. Microscopic Comment COLON AND RECTUM (INCLUDING TRANS-ANAL RESECTION): Specimen: Right colon  with ileum and appendix. Procedure: Right colon segmental resection. Tumor site: Right colon. Specimen integrity: Intact. Macroscopic tumor perforation: Not identified. Invasive tumor: Maximum size: 7.5 cm. Histologic type(s): Invasive adenocarcinoma. Histologic grade and differentiation: G2: moderately differentiated/low grade Type of polyp in which invasive carcinoma arose: N/A. Microscopic extension of invasive tumor: Tumor invades through muscularis propria into subserosal tissue. Lymph-Vascular invasion: Not identified. Peri-neural invasion: Not identified. Tumor deposit(s) (discontinuous extramural extension): Not identified. Resection margins: Proximal margin: Negative. Distal margin: Negative. Circumferential (radial) (posterior ascending, posterior descending; lateral and posterior mid-rectum; and entire lower 1/3 rectum): Negative. Mesenteric margin (sigmoid and transverse): N/A. Distance closest margin (if all above margins negative): 1 cm radial margin. Treatment effect (neo-adjuvant therapy): N/A. Additional polyp(s): Tubular adenoma (x2). Non-neoplastic findings: Benign appendix. Lymph nodes: number examined 17; number positive: 0 Pathologic Staging: pT3, pN0, pMX Ancillary studies: MMR and MSI will be ordered and reported separately.  ADDITIONAL INFORMATION: Mismatch Repair (MMR) Protein Immunohistochemistry (IHC) IHC Expression Result: MLH1: Preserved nuclear expression (greater 50% tumor expression) MSH2: Preserved nuclear expression (greater 50% tumor expression) MSH6: Preserved nuclear expression (greater 50% tumor expression) PMS2: Preserved nuclear expression (greater 50% tumor expression) * Internal control demonstrates intact nuclear expression Interpretation: NORMAL There is preserved expression of the major and minor MMR proteins. There is a very low probability that microsatellite instability (MSI) is present. However, certain clinically significant MMR  protein mutations may result in preservation of nuclear expression. It is recommended that the preservation of protein expression be correlated with molecular based MSI testing.  MSI-STABLE (by PCR)  RADIOGRAPHIC STUDIES: I have personally reviewed the radiological images as listed and agreed with the findings in the report. Dg Chest 2 View  05/21/2015  CLINICAL DATA:  Abdominal distension and n/v since yesterday; no specific chest complaints today; f/u colon tumor per ordering MD; no known cardiopulmonary problems; smoker EXAM: CHEST  2 VIEW COMPARISON:  None. FINDINGS: Lungs are hyperexpanded but clear. No pleural effusion or pneumothorax. Cardiac silhouette is normal in size. No mediastinal or hilar masses or evidence of adenopathy. Oral/nasogastric to has its tip projecting chest through the GE junction. Recommend further insertion of at least 10 cm for more optimal positioning. There old left-sided rib fractures. IMPRESSION: 1. No acute cardiopulmonary disease. 2. Tip of the oral/nasogastric tube chest passes through the GE junction. Recommend  further insertion. Electronically Signed   By: Lajean Manes M.D.   On: 05/21/2015 09:14   Ct Abdomen Pelvis W Contrast  05/20/2015  CLINICAL DATA:  Acute generalized abdominal pain. EXAM: CT ABDOMEN AND PELVIS WITH CONTRAST TECHNIQUE: Multidetector CT imaging of the abdomen and pelvis was performed using the standard protocol following bolus administration of intravenous contrast. CONTRAST:  54m OMNIPAQUE IOHEXOL 300 MG/ML SOLN, 1012mOMNIPAQUE IOHEXOL 300 MG/ML SOLN COMPARISON:  None. FINDINGS: Severe degenerative disc disease is noted at L5-S1. Visualized lung bases are unremarkable. No gallstones are noted. The liver, spleen and pancreas are unremarkable. Adrenal glands appear normal. Bilateral renal cortical scarring is noted. Simple cyst is seen involving the lower pole of left kidney. No hydronephrosis or renal obstruction is noted. The appendix  appears normal. Severe small bowel dilatation is noted. This appears to be due to irregular and enhancing abnormality seen involving the ileocecal valve and cecum, which is concerning for possible colonic malignancy. No abnormal fluid collection is noted. Urinary bladder appears normal. No significant adenopathy is noted. Atherosclerosis of abdominal aorta is noted without aneurysm formation. IMPRESSION: There is a irregular and enhancing soft tissue abnormality involving the ileocecal valve and cecum which is resulting and small bowel obstruction. This is highly concerning for colonic malignancy, and colonoscopy is recommended for further evaluation. It is possible that this represents inflammation from inflammatory bowel disease such as Crohn's disease, but that is felt to be less likely. Electronically Signed   By: JaMarijo ConceptionM.D.   On: 05/20/2015 16:53   Dg Abd 2 Views  05/22/2015  CLINICAL DATA:  ADMITTED TO HOSPITAL YESTERDAY FOR ABD PAIN X 1 WK, NG TUBE AND ENEMA TREATMENT LAST NIGHT, PT DENIES ABD PAIN, C/O WEAKNESS EXAM: ABDOMEN - 2 VIEW COMPARISON:  05/21/2015 FINDINGS: Nasogastric tube is in place, side port overlying the level of the gastroesophageal junction. There is dilatation of small bowel loops, associated with air-fluid levels on the erect view. Contrast is identified within nondilated loops of colon. Colonic diverticula are present. IMPRESSION: Persistent small bowel obstruction. Electronically Signed   By: ElNolon Nations.D.   On: 05/22/2015 10:40   Dg Abd Portable 2v  05/21/2015  CLINICAL DATA:  89ear old male with abdominal distention, nausea and vomiting EXAM: PORTABLE ABDOMEN - 2 VIEW COMPARISON:  CT abdomen/ pelvis 05/20/2015 FINDINGS: A nasogastric tube is present. The tip overlies the gastric bubble in the proximal side hole the gastroesophageal junction. Multiple loops of dilated and air filled small bowel in the mid and left abdomen consistent with distal small bowel  obstruction. No evidence of free air. Moderate volume of formed stool overlies the rectum. No acute osseous abnormality. 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 IMPRESSION: 1. Persistent small bowel obstruction. Maximal small bowel diameter 4.5 cm. 2. The tip of the nasogastric tube overlies the gastric bubble while the proximal side hole overlies the gastroesophageal junction. Consider advancing a a few cm for more optimal position of the proximal side hole within the stomach. Electronically Signed   By: HeJacqulynn Cadet.D.   On: 05/21/2015 09:15   Colonoscopy 05/25/2015 Dr. OuPaulita FujitaFindings: External hemorrhoids with rectal prolapse, otherwise normal digital rectal exam. Prep quality was poor, with solid impenetrable stool obscuring our views, and making passage of colonoscope proximal to the sigmoid colon impossible. There were a few benign-appearing polyps seen in the rectosigmoid colon, which were neither biopsied nor removed. Retroflexion was not performed. Withdrawal time  was not recorded, since full colonoscopy was done. . The scope was withdrawn and the procedure completed.  COMPLICATIONS: None immediate.  ENDOSCOPIC IMPRESSION:  As above. Solid stool in distal colon, despite several days of enema prep.  ASSESSMENT & PLAN:  70 year old African-American male, who has not seen Dr. For my years, presented with small bowel obstruction symptoms.   1. Right side colon cancer, moderately differentiated adenocarcinoma, pT3N0Mx, stage IIA, MMR normal  -I discussed his CT scan findings, and the surgical pathology results in greater details. -he is scheduled to have a CT chest to complete staging workup. -if his CT chest is negative for metastasis, he has stage II disease. No high risk features, such as perforation, LVI, poorly differentiation, etc. He had adequate nodes (17) removal. His postoperative CEA  Was normal -I discussed the risk of  cancer recurrence disease, which is moderate.  -I discussed that the role of adjuvant chemotherapy is controversial for stage II colon cancer. Since his cancer has no high risk features, I do not recommend adjuvant chemotherapy. - I discussed the surveillance plan, which includes physical exam and lab test (including CBC, CMP and CEA) every 3 months for the first 2 years, then every 6-12 months, colonoscopy in one year, and surveilliance CT scan every 6-12 month for up to 5 year.  -he does not have full colonoscopy before surgery, I strongly recommend him to follow-up with Dr. Paulita Fujita to complete colonoscopy in the next few months -he voiced good understanding the above and agrees to follow up with me.  2. Genetics -He has family history of ovarian and pancreatic cancer, I recommend genetic referral to rule out  Inheritable genetic syndrome  3. High blood pressure -His blood pressure was high during his office visit with me today. He does not have a primary care physician, to establish his care with the primary care  Physician. -I give him the contact info for [facility], he will call for appointment.    Recommendations: Based on information available as of today's consult. Recommendations may change depending on the results of further tests or exams. 1) No chemotherapy necessary-need to follow up in 3 months with labs 2) You need to obtain a primary care physician 3) Full colonoscopy in 2-3 months with Dr. Paulita Fujita 4) Genetics counselor appointment  Next Steps: 1) Genetics/ 3 month follow up will be scheduled today/ Keep CT chest appointment 2) Call [facility] to establish care 3) Call [facility] if you have not heard from them in 2 months   All questions were answered. The patient knows to call the clinic with any problems, questions or concerns.  I spent 55 minutes counseling the patient face to face. The total time spent in the appointment was 60 minutes and more than  50% was on counseling.     Truitt Merle, MD 06/09/2015 11:41 AM

## 2015-06-09 NOTE — Progress Notes (Signed)
Oncology Nurse Navigator Documentation  Oncology Nurse Navigator Flowsheets 06/09/2015  Navigator Location [facility]  Navigator Encounter Type -  Abnormal Finding Date -  Confirmed Diagnosis Date -  Surgery Date -  Treatment Initiated Date (No Data)  Patient Visit Type MedOnc  Treatment Phase Other--No treatment needed-observation  Barriers/Navigation Needs Education  Education Understanding Cancer/ Treatment Options;Other--colonoscopy in 2-3 mo; needs PCP, genetics referral  Support Groups/Services [employer] ;GI Support Group  Acuity Level 1  Time Spent with Patient 32  Met with patient and his ex-wife during new patient visit. Explained the role of the GI Nurse Navigator and provided New Patient Packet with information on: 1. Colon cancer w/ info sheet on CEA marker 2. Support groups 3. Advanced Directives 4. Fall Safety Plan Answered questions, reviewed current treatment plan using TEACH back and provided emotional support. Provided copy of current treatment plan. Ex-wife reports she will assist him in getting in to see PCP and return to Dr. Paulita Fujita for his full colonoscopy. Navigator will follow up on this also.  Merceda Elks, RN, BSN GI Oncology [place]

## 2015-06-12 ENCOUNTER — Encounter: Payer: Self-pay | Admitting: Hematology

## 2015-06-13 ENCOUNTER — Ambulatory Visit
Admission: RE | Admit: 2015-06-13 | Discharge: 2015-06-13 | Disposition: A | Discharge status: Home / Self Care | Payer: Medicare Other | Source: Ambulatory Visit | Attending: General Surgery | Admitting: General Surgery

## 2015-06-13 DIAGNOSIS — C189 Malignant neoplasm of colon, unspecified: Secondary | ICD-10-CM | POA: Diagnosis not present

## 2015-06-13 DIAGNOSIS — R918 Other nonspecific abnormal finding of lung field: Secondary | ICD-10-CM | POA: Diagnosis not present

## 2015-06-13 MED ORDER — IOPAMIDOL (ISOVUE-300) INJECTION 61%
75.0000 mL | Freq: Once | INTRAVENOUS | Status: AC | PRN
  Administered 2015-06-13: 75 mL via INTRAVENOUS

## 2015-06-15 ENCOUNTER — Telehealth: Payer: Self-pay | Admitting: *Deleted

## 2015-06-15 DIAGNOSIS — C182 Malignant neoplasm of ascending colon: Secondary | ICD-10-CM

## 2015-06-15 NOTE — Telephone Encounter (Signed)
Oncology Nurse Navigator Documentation  Oncology Nurse Navigator Flowsheets 06/15/2015  Navigator Location [facility]  Navigator Encounter Type Telephone  Telephone Outgoing Call;Patient Update  Abnormal Finding Date -  Confirmed Diagnosis Date -  Surgery Date -  Treatment Initiated Date -  Patient Visit Type -  Treatment Phase Other--Observation  Barriers/Navigation Needs Coordination of Care--Needs PCP  Education -  Interventions Coordination of Care-placed referral for internal medicine w/[facility]  Coordination of Care Other--spoke with daughter, he has not made the attempt to get PCP. Sees Dr. Excell Seltzer tomorrow-can he be the PCP. Informed her that he can't see a surgeon for primary care--more appropriate and affordable to see IM. Agreed to allow nurse to place referral.  Support Groups/Services -  Acuity Level 1  Time Spent with Patient 15

## 2015-07-05 ENCOUNTER — Other Ambulatory Visit: Payer: Medicare Other

## 2015-07-05 ENCOUNTER — Ambulatory Visit (HOSPITAL_BASED_OUTPATIENT_CLINIC_OR_DEPARTMENT_OTHER): Payer: Medicare Other | Admitting: Genetic Counselor

## 2015-07-05 ENCOUNTER — Encounter: Payer: Self-pay | Admitting: Genetic Counselor

## 2015-07-05 DIAGNOSIS — Z315 Encounter for genetic counseling: Secondary | ICD-10-CM

## 2015-07-05 DIAGNOSIS — Z801 Family history of malignant neoplasm of trachea, bronchus and lung: Secondary | ICD-10-CM

## 2015-07-05 DIAGNOSIS — Z860100 Personal history of colon polyps, unspecified: Secondary | ICD-10-CM

## 2015-07-05 DIAGNOSIS — Z8041 Family history of malignant neoplasm of ovary: Secondary | ICD-10-CM

## 2015-07-05 DIAGNOSIS — Z800 Family history of malignant neoplasm of digestive organs: Secondary | ICD-10-CM | POA: Diagnosis not present

## 2015-07-05 DIAGNOSIS — Z809 Family history of malignant neoplasm, unspecified: Secondary | ICD-10-CM

## 2015-07-05 DIAGNOSIS — C182 Malignant neoplasm of ascending colon: Secondary | ICD-10-CM | POA: Diagnosis not present

## 2015-07-05 NOTE — Progress Notes (Signed)
REFERRING PROVIDER: Truitt Merle, MD  PRIMARY PROVIDER:  No primary care provider on file.  PRIMARY REASON FOR VISIT:  1. 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 Family history of ovarian cancer   3. Family history of pancreatic cancer   4. Family history of lung cancer   5. Family history of cancer   6. History of colonic polyps      HISTORY OF PRESENT ILLNESS:   Mr. Christian Black, a 70 y.o. male, was seen for a  cancer genetics consultation at the request of Dr. Burr Medico due to a personal history of colon cancer and family history of ovarian, pancreatic, and other cancers.  [name] presents to clinic today with his ex-wife to discuss the possibility of a hereditary predisposition to cancer, genetic testing, and to further clarify his future cancer risks, as well as potential cancer risks for family members.   In February 2017, at the age of 77, Mr. Skoog was diagnosed with invasive adenocarcinoma of the right colon.  This was treated with surgical resection.  Immunohistochemistry and microsatellite stability testing of this tumor were both normal.  Additionally, two tubular adenomatous polyps were found at the time of surgical resection.     CANCER HISTORY:  Oncology History   Presented to emergency department with abrupt abdominal pain, distention and N/V.     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 Zella Richer   05/31/2015 Tumor Marker CEA = 4.3     RISK FACTORS:  Colonoscopy: yes; abnormal, colon cancer with two additional tubular adenomatous polyps found Up to date with prostate exams:  no. Any excessive radiation exposure in the past:  Not assessed  Past Medical History  Diagnosis Date  . Medical history non-contributory     Past Surgical History  Procedure Laterality Date  . Back surgery    . Colonoscopy with propofol N/A 05/25/2015    Procedure: COLONOSCOPY WITH PROPOFOL;  Surgeon: Arta Silence, MD;  Location: [facility];  Service: Endoscopy;  Laterality: N/A;  . Colon resection  05/27/2015    Procedure: LAPAROSCOPIC ASSISTED RIGHT COLON RESECTION;  Surgeon: Excell Seltzer, MD;  Location: [facility];  Service: General;;    Social History   Social History  . Marital Status: Single    Spouse Name: N/A  . Number of Children: N/A  . Years of Education: N/A   Social History Main Topics  . Smoking status: Current Every Day Smoker -- 0.50 packs/day for 30 years    Types: Cigarettes  . Smokeless tobacco: Never Used     Comment: 1 ppd every 3 days now (07/05/2015)  . Alcohol Use: 0.6 oz/week    1 Cans of beer per week     Comment: 1 can beer per day  . Drug Use: Yes    Special: Marijuana  . Sexual Activity: Not Asked   Other Topics Concern  . None   Social History Narrative     FAMILY HISTORY:  We obtained a detailed, 4-generation family history.  Significant diagnoses are listed below: Family History  Problem Relation Age of Onset  . Ovarian cancer Mother 29  . Pancreatic cancer Father 59    smoker  . Emphysema Maternal Aunt   . Lung cancer Maternal Uncle 50    smoker  . Heart disease Paternal Uncle   . Crohn's disease Grandchild     granddaughter with crohn's disease  . Kidney failure Maternal Aunt     late 74s  . Cancer Cousin     maternal 1st cousin dx. with NOS cancer in his late 79s  .  Heart disease Cousin     maternal 1st cousin d. heart disease    Mr. Patton has two daughters and two sons--all of whom are in their early-mid-40s and have never had cancer.  He has three grandsons and three granddaughters; one of his granddaughters has a Crohn's disease and so has already had a colonoscopy that was normal.  Mr. Pleasant is an only child.  Mr. Shahid mother died of blood clots following TAH-BSO to treat her ovarian cancer at the age of 76-75.  She was a heavy smoker and had no other known history of cancer.  Mr. Nelson father was also a smoker and died of pancreatic cancer at the age of 30.    Mr. Neel mother had two full brothers, two full sisters, and a maternal half-sister.  One of these brothers, a smoker, died of lung cancer at the age of 59.  The other brother is currently 38 and has never been diagnosed with cancer.  One sister died of emphysema at the age of 53; the maternal half-sister died of renal failure in her late 11s.  The third sister is currently in her early 26s and has never had cancer.  One maternal first cousin was diagnosed with an unspecified type of cancer in his late 59s and has since passed away.  Mr. Reyn reports no further known cancers for any other maternal first cousins.  Mr. Tymier maternal grandmother passed away between 34-35 years of age.  His maternal grandfather died of an unknown cause in his 23s, but spent no time in the hospital and Mr. Juanpablo reports that this was not cancer.  He has no further information or any maternal great aunts/uncles or great grandparents.  Mr. Adian father had one full brother and sister, both of whom have passed away.  His brother died of heart disease at the age of 70; he had no children of his own.  His sister died in her 25s of an unspecified cause.  Mr. Seymore has no further information for her or her children.  He has no information for this paternal grandparents or for any great aunts/uncles or great grandparents.   He reports no history of previous genetic testing in the family.  Patient's maternal and paternal ancestors are of African American descent. There is no reported Ashkenazi Jewish ancestry. There is no known consanguinity.  GENETIC COUNSELING ASSESSMENT: KAM RAHIMI is a 70 y.o. male with a personal and family history of cancer. We, therefore, discussed and recommended the following at today's visit.   DISCUSSION: We reviewed the characteristics, features and inheritance patterns of hereditary cancer syndromes. We also discussed genetic testing, the process of testing, and insurance coverage.  We discussed that most cancer happens by chance and is not caused by a hereditary genetic mutation.  For the 5-10% of cancer that is hereditary, we can pick  up on certain "red flags" in someone's personal and family history of cancer that makes [place] more suspicious for a potential genetic cause of the cancer history.  Some of these red flags include young age of cancer diagnosis, multiple cases of cancer in the family and in multiple generations of a family, and multiple primary cancer diagnoses for individuals, among others.    We reviewed that Mr. Shilo does not meet Medicare insurance criteria for coverage of genetic testing.  We also reviewed that there are reassuring characteristics about Mr. Matheau personal and family history of cancer.  His tumor testing was normal, which makes [place] less concerned for mutations within four of the five Lynch syndrome genes.  Additionally, the pancreatic cancer and ovarian cancers diagnosed in his father and mother are on separate sides of the family.  The other cancers in the family are not typically those we would associate with a genetic cause (i.e. Lung cancer in the maternal uncle who was also a smoker).  Thus, we discussed that this seems to be reassuring for Mr. Andersen own hereditary cancer risks, but that self-pay genetic testing is available for him, if he is  interested.  Based on Mr. Silvestro's personal and family history of cancer, he does not meet his insurance criteria for genetic testing. However, we discussed the option of having hereditary cancer testing through [facility].  This is a self-pay option which does not require that Mr. Weideman meet a certain set of criteria based on his personal and family history of cancer.  This test would be an up-front cost of about $260 and the sample is obtained for testing via a saliva sample.  It takes approximately 3-4 weeks to get results back.  Mr. Hatchel chose not to have genetic testing today, but will further consider this testing option.  He has my contact information and knows that he is welcome to call with any questions or should he change his mind and wish to proceed with testing.  We discussed that, if he decides to have testing, that he would not necessarily need to come back in for a follow-up visit, but that we could ship the saliva kit to him at his home address and he could mail out to the lab from his home.  PLAN: Despite our recommendation, Mr. Devery did not wish to pursue genetic testing at today's visit, but will further consider testing and will contact us if he changes his mind.  He was provided with a brochure about genetic testing through [facility].  We understand his decision, and remain available to coordinate genetic testing at any time in the future. We, therefore, recommend Mr. Whitecotton continue to follow the cancer screening guidelines given by his oncology and primary healthcare providers.  Lastly, we encouraged Mr. Likes to remain in contact with cancer genetics annually so that we can continuously update the family history and inform him of any changes in cancer genetics and testing that may be of benefit for this family.   Mr.  Heacock questions were answered to his satisfaction today. Our contact information was provided should additional questions or  concerns arise. Thank you for the referral and allowing [place] to share in the care of your patient.   Jeanine Luz, MS, [facility] Certified Genetic Counselor [email or web address] Phone: [phone]  The patient was seen for a total of 45 minutes in face-to-face genetic counseling.  This patient was discussed with Drs. Magrinat, Lindi Adie and/or Burr Medico who agrees with the  above.    _______________________________________________________________________ For Office Staff:  Number of people involved in session: 3 Was an Intern/ student involved with case: yes

## 2015-08-08 ENCOUNTER — Other Ambulatory Visit: Payer: Self-pay

## 2015-08-08 MED ORDER — OXYCODONE-ACETAMINOPHEN 10-325 MG PO TABS: 1.0000 | ORAL_TABLET | ORAL | Status: DC | PRN

## 2015-08-17 ENCOUNTER — Telehealth: Payer: Self-pay | Admitting: *Deleted

## 2015-08-17 NOTE — Telephone Encounter (Signed)
Oncology Nurse Navigator Documentation  Oncology Nurse Navigator Flowsheets 08/17/2015  Navigator Location [facility]  Navigator Encounter Type Telephone  Telephone Appt Confirmation/Clarification;Outgoing Call  Abnormal Finding Date -  Confirmed Diagnosis Date -  Surgery Date -  Treatment Initiated Date -  Patient Visit Type -  Treatment Phase Survivorship  Barriers/Navigation Needs Coordination of Care  Education -  Interventions Coordination of Care--needs PCP and repeat colonoscopy  Coordination of Care Appts--sees Dr. Celso Amy at [place] [facility] on 11/01/15 at 12:45/1:00 and Dr. Erlinda Hong office will call daughter with appointment for follow up to schedule the colonoscopy.  Support Groups/Services -  Acuity Level 2  Time Spent with Patient [facility] to follow up on referral order placed in March for PCP. Also called Dr. Erlinda Hong office to follow up on need for full colonoscopy this Summer. Daughter notified of PCP appointment and to bring his insurance card and photo ID to appointment and [facility] will be calling her for Dr. Erlinda Hong appointment.

## 2015-08-31 ENCOUNTER — Other Ambulatory Visit: Payer: Self-pay | Admitting: *Deleted

## 2015-08-31 DIAGNOSIS — C182 Malignant neoplasm of ascending colon: Secondary | ICD-10-CM

## 2015-09-01 ENCOUNTER — Encounter: Payer: Self-pay | Admitting: Hematology

## 2015-09-01 ENCOUNTER — Encounter: Payer: Medicare Other | Admitting: Hematology

## 2015-09-01 ENCOUNTER — Other Ambulatory Visit: Payer: Medicare Other

## 2015-09-01 ENCOUNTER — Other Ambulatory Visit: Payer: Self-pay | Admitting: Hematology

## 2015-09-01 NOTE — Progress Notes (Signed)
No show  This encounter was created in error - please disregard.

## 2015-09-02 ENCOUNTER — Telehealth: Payer: Self-pay | Admitting: Hematology

## 2015-09-02 NOTE — Telephone Encounter (Signed)
per pof to sch pt appt-cld & left message for pt to call and r/s missed appt

## 2015-09-06 ENCOUNTER — Telehealth: Payer: Self-pay | Admitting: Hematology

## 2015-09-06 NOTE — Telephone Encounter (Signed)
spoke w/ pt wife confirmed resched apts, apts were next available

## 2015-09-29 ENCOUNTER — Encounter: Payer: Self-pay | Admitting: Hematology

## 2015-09-29 ENCOUNTER — Other Ambulatory Visit (HOSPITAL_BASED_OUTPATIENT_CLINIC_OR_DEPARTMENT_OTHER): Payer: Medicare Other

## 2015-09-29 ENCOUNTER — Ambulatory Visit (HOSPITAL_BASED_OUTPATIENT_CLINIC_OR_DEPARTMENT_OTHER): Payer: Medicare Other | Admitting: Hematology

## 2015-09-29 VITALS — BP 149/74 | HR 54 | Temp 97.7°F | Resp 18 | Ht 76.0 in | Wt 150.5 lb

## 2015-09-29 DIAGNOSIS — C182 Malignant neoplasm of ascending colon: Secondary | ICD-10-CM

## 2015-09-29 DIAGNOSIS — R030 Elevated blood-pressure reading, without diagnosis of hypertension: Secondary | ICD-10-CM | POA: Diagnosis not present

## 2015-09-29 LAB — COMPREHENSIVE METABOLIC PANEL
ALBUMIN: 3.2 g/dL — AB (ref 3.5–5.0)
ALK PHOS: 60 U/L (ref 40–150)
ALT: 11 U/L (ref 0–55)
ANION GAP: 7 meq/L (ref 3–11)
AST: 18 U/L (ref 5–34)
BILIRUBIN TOTAL: 0.33 mg/dL (ref 0.20–1.20)
BUN: 17.4 mg/dL (ref 7.0–26.0)
CO2: 27 mEq/L (ref 22–29)
Calcium: 9.2 mg/dL (ref 8.4–10.4)
Chloride: 108 mEq/L (ref 98–109)
Creatinine: 1.4 mg/dL — ABNORMAL HIGH (ref 0.7–1.3)
EGFR: 58 mL/min/{1.73_m2} — ABNORMAL LOW (ref >90)
Glucose: 96 mg/dl (ref 70–140)
Potassium: 4.4 mEq/L (ref 3.5–5.1)
Sodium: 142 mEq/L (ref 136–145)
TOTAL PROTEIN: 7.1 g/dL (ref 6.4–8.3)

## 2015-09-29 LAB — CBC WITH DIFFERENTIAL/PLATELET
BASO%: 0.7 % (ref 0.0–2.0)
Basophils Absolute: 0 10*3/uL (ref 0.0–0.1)
EOS%: 4.5 % (ref 0.0–7.0)
Eosinophils Absolute: 0.1 10*3/uL (ref 0.0–0.5)
HCT: 40.5 % (ref 38.4–49.9)
HGB: 13.4 g/dL (ref 13.0–17.1)
LYMPH#: 1.2 10*3/uL (ref 0.9–3.3)
LYMPH%: 40.5 % (ref 14.0–49.0)
MCH: 29.1 pg (ref 27.2–33.4)
MCHC: 33.1 g/dL (ref 32.0–36.0)
MCV: 88 fL (ref 79.3–98.0)
MONO#: 0.3 10*3/uL (ref 0.1–0.9)
MONO%: 11.1 % (ref 0.0–14.0)
NEUT%: 43.2 % (ref 39.0–75.0)
NEUTROS ABS: 1.3 10*3/uL — AB (ref 1.5–6.5)
PLATELETS: 207 10*3/uL (ref 140–400)
RBC: 4.6 10*6/uL (ref 4.20–5.82)
RDW: 14.6 % (ref 11.0–14.6)
WBC: 2.9 10*3/uL — AB (ref 4.0–10.3)

## 2015-09-29 NOTE — Progress Notes (Signed)
[place]  [phone] [phone]  Clinic Follow Up Note   Patient Care Team: Binnie Rail, MD as PCP - General (Internal Medicine) Excell Seltzer, MD as Consulting Physician (General Surgery) Arta Silence, MD as Consulting Physician (Gastroenterology) 09/29/2015   CHIEF COMPLAINTS:  Follow up stage II colon cancer  Oncology History   Presented to emergency department with abrupt abdominal pain, distention and N/V.     Colon cancer, ascending ([place])   05/20/2015 Imaging CT ABD/PELVIS: Ileocecal valve/cecum wall thickening; no evidence of mets   05/25/2015 Procedure Colonoscopy: Prep quality was poor, with solid impenetrable stool obscuring our views, and making passage of colonoscope proximal to the sigmoid colon impossible. There were a few benign-appearing polyps seen in the rectosigmoid colon   05/27/2015 Pathology Results MMR by IHC normal   05/27/2015 Pathologic Stage T3 N0 MX-Moderately differentiated; Invades through muscularis propria; 0/17 nodes were +   05/27/2015 Initial Diagnosis Colon cancer, ascending ([place])   05/27/2015 Definitive Surgery Laparoscopic assisted right colon resection--Dr. Zella Richer   05/31/2015 Tumor Marker CEA = 4.3    HISTORY OF PRESENTING ILLNESS:  Fleet Contras 70 y.o. male is here because of his recently diagnosed  II colon cancer. He is accompanied by his Ex-wife to the clinic today.  Nabeel Gladson is a 70 year old male who has not seen a physician, presented to [facility] With abdominal pain, nausea and vomiting on 05/20/2015. He denies any significant before this episode except some weight loss.  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 He never had a colonoscopy. He was seen by GI Dr. Paulita Fujita and underwent a colonoscopy. Unfortunately, the colonoscopy was unsuccessful due to the poor prep. The patient was started on  TPN in the interim. He subsequently underwent a laparoscopic assisted right colon resection. he was discharged home on 05/31/2015.   He is recovering well from the surgery. He has good appetite and eats well. His BM is normal. He still has soreness at the incision, he has not need any pain meds.   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 He is divorced, has 4 children, lives with a friend. He has chronic intermittent back pain since his surgery in 1983.   CURRENT THERAPY: Surveillance  INTERIM HISTORY: Mr. Salton returns for follow-up. He is accompanied by his wife to the clinic today. He is doing well, denies any significant pain, abdominal discomfort, nausea, or other new symptoms. His bowel movement is normal, no constipation or diarrhea, no melena or hematochezia. His weight is stable. He has mild fatigue, not very physically active, but functions well at home.   MEDICAL HISTORY:  Past Medical History  Diagnosis Date  . Medical history non-contributory     SURGICAL HISTORY: Past Surgical History  Procedure Laterality Date  . Back surgery    . Colonoscopy with propofol N/A 05/25/2015    Procedure: COLONOSCOPY WITH PROPOFOL;  Surgeon: Arta Silence, MD;  Location: [facility] ENDOSCOPY;  Service: Endoscopy;  Laterality: N/A;  . Colon resection  05/27/2015    Procedure: LAPAROSCOPIC ASSISTED RIGHT COLON RESECTION;  Surgeon: Excell Seltzer, MD;  Location: [facility] ORS;  Service: General;;    SOCIAL HISTORY: Social History   Social History  . Marital Status: Single    Spouse Name: N/A  . Number of Children: N/A  . Years of Education: N/A  Occupational History  . Not on file.   Social History Main Topics  . Smoking status: Current Every Day Smoker -- 0.50 packs/day for 30 years    Types: Cigarettes  . Smokeless tobacco: Never Used     Comment: 1 ppd every 3 days now (07/05/2015)  . Alcohol Use: 0.6 oz/week    1 Cans of beer  per week     Comment: 1 can beer per day  . Drug Use: Yes    Special: Marijuana  . Sexual Activity: Not on file   Other Topics Concern  . 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   FAMILY HISTORY: Family History  Problem Relation Age of Onset  . Ovarian cancer Mother 53  . Pancreatic cancer Father 59    smoker  . Emphysema Maternal Aunt   . Lung cancer Maternal Uncle 50    smoker  . Heart disease Paternal Uncle   . Crohn's disease Grandchild     granddaughter with crohn's disease  . Kidney failure Maternal Aunt     late 70s  . Cancer Cousin     maternal 1st cousin dx. with NOS cancer in his late 60s  . Heart disease Cousin     maternal 1st cousin d. heart disease    ALLERGIES:  has No Known Allergies.  MEDICATIONS:  Current Outpatient Prescriptions  Medication Sig Dispense Refill  . colchicine 0.6 MG tablet Take 1 tablet (0.6 mg total) by mouth daily. (Patient not taking: Reported on 06/09/2015)    . docusate sodium (COLACE) 100 MG capsule Take 1 capsule (100 mg total) by mouth 2 (two) times daily. (Patient not taking: Reported on 06/09/2015) 10 capsule 0  . oxyCODONE-acetaminophen (PERCOCET) 10-325 MG tablet Take 1 tablet by mouth every 4 (four) hours as needed for pain. (Patient not taking: Reported on 09/29/2015) 120 tablet 0  . oxyCODONE-acetaminophen (PERCOCET/ROXICET) 5-325 MG tablet Take 1-2 tablets by mouth every 4 (four) hours as needed for moderate pain or severe pain. (Patient not taking: Reported on 06/09/2015) 40 tablet 0   No current facility-administered medications for this visit.    REVIEW OF SYSTEMS:   Constitutional: Denies fevers, chills or abnormal night sweats Eyes: Denies blurriness of vision, double vision or watery eyes Ears, nose, mouth, throat, and face: Denies mucositis or sore throat Respiratory: Denies cough,  dyspnea or wheezes Cardiovascular: Denies palpitation, chest discomfort or lower extremity swelling Gastrointestinal:  Denies nausea, heartburn or change in bowel habits Skin: Denies abnormal skin rashes Lymphatics: Denies new lymphadenopathy or easy bruising Neurological:Denies numbness, tingling or new weaknesses Behavioral/Psych: Mood is stable, no new changes  All other systems were reviewed with the patient and are negative.  PHYSICAL EXAMINATION: ECOG PERFORMANCE STATUS: 1 - Symptomatic but completely ambulatory  Filed Vitals:   09/29/15 1301  BP: 149/74  Pulse: 54  Temp: 97.7 F (36.5 C)  Resp: 18   Filed Weights   09/29/15 1301  Weight: 150 lb 8 oz (68.266 kg)    GENERAL:alert, no distress and comfortable SKIN: skin color, texture, turgor are normal, no rashes or significant lesions EYES: normal, conjunctiva are pink and non-injected, sclera clear OROPHARYNX:no exudate, no erythema and lips, buccal mucosa, and tongue normal  NECK: supple, thyroid normal size, non-tender, without nodularity LYMPH:  no palpable lymphadenopathy in the cervical, axillary or inguinal LUNGS: clear  to auscultation and percussion with normal breathing effort HEART: regular rate & rhythm and no murmurs and no lower extremity edema ABDOMEN:abdomen soft, non-tender and normal bowel sounds Musculoskeletal:no cyanosis of digits and no clubbing  PSYCH: alert & oriented x 3 with fluent speech NEURO: no focal motor/sensory deficits  LABORATORY DATA:  I have reviewed the data as listed CBC Latest Ref Rng 09/29/2015 05/30/2015 05/28/2015  WBC 4.0 - 10.3 10e3/uL 2.9(L) 8.2 6.1  Hemoglobin 13.0 - 17.1 g/dL 13.4 12.0(L) 11.7(L)  Hematocrit 38.4 - 49.9 % 40.5 36.5(L) 35.5(L)  Platelets 140 - 400 10e3/uL 207 276 241    CMP Latest Ref Rng 09/29/2015 05/30/2015 05/29/2015  Glucose 70 - 140 mg/dl 96 122(H) 120(H)  BUN 7.0 - 26.0 mg/dL 17.4 17 17   Creatinine 0.7 - 1.3 mg/dL 1.4(H) 0.84 0.73  Sodium 136 -  145 mEq/L 142 136 136  Potassium 3.5 - 5.1 mEq/L 4.4 4.0 3.9  Chloride 101 - 111 mmol/L - 100(L) 98(L)  CO2 22 - 29 mEq/L 27 27 29   Calcium 8.4 - 10.4 mg/dL 9.2 8.7(L) 8.8(L)  Total Protein 6.4 - 8.3 g/dL 7.1 6.7 -  Total Bilirubin 0.20 - 1.20 mg/dL 0.33 0.9 -  Alkaline Phos 40 - 150 U/L 60 125 -  AST 5 - 34 U/L 18 129(H) -  ALT 0 - 55 U/L 11 120(H) -      CEA  Status: Finalresult Visible to patient:  Not Released Nextappt: Today at 11:00 AM in Oncology [facility]r Medico, Krista Blue, MD)         Ref Range 9d ago    CEA 0.0 - 4.7 ng/mL 4.3         PATHOLOGY REPORT: Diagnosis 05/27/2015 Colon, segmental resection for tumor, right - INVASIVE ADENOCARCINOMA, MODERATELY DIFFERENTIATED, SPANNING 7.5 CM. - TUMOR INVADES THROUGH MUSCULARIS PROPRIA INTO SUBSEROSAL TISSUE. - RESECTION MARGINS ARE NEGATIVE. - SEVENTEEN OF SEVENTEEN LYMPH NODES ARE NEGATIVE FOR CARCINOMA (0/17). - TUBULAR ADENOMA (X2). - BENIGN APPENDIX. - SEE ONCOLOGY TABLE. Microscopic Comment COLON AND RECTUM (INCLUDING TRANS-ANAL RESECTION): Specimen: Right colon with ileum and appendix. Procedure: Right colon segmental resection. Tumor site: Right colon. Specimen integrity: Intact. Macroscopic tumor perforation: Not identified. Invasive tumor: Maximum size: 7.5 cm. Histologic type(s): Invasive adenocarcinoma. Histologic grade and differentiation: G2: moderately differentiated/low grade Type of polyp in which invasive carcinoma arose: N/A. Microscopic extension of invasive tumor: Tumor invades through muscularis propria into subserosal tissue. Lymph-Vascular invasion: Not identified. Peri-neural invasion: Not identified. Tumor deposit(s) (discontinuous extramural extension): Not identified. Resection margins: Proximal margin: Negative. Distal margin: Negative. Circumferential (radial) (posterior ascending, posterior descending; lateral and posterior mid-rectum; and entire lower 1/3 rectum):  Negative. Mesenteric margin (sigmoid and transverse): N/A. Distance closest margin (if all above margins negative): 1 cm radial margin. Treatment effect (neo-adjuvant therapy): N/A. Additional polyp(s): Tubular adenoma (x2). Non-neoplastic findings: Benign appendix. Lymph nodes: number examined 17; number positive: 0 Pathologic Staging: pT3, pN0, pMX Ancillary studies: MMR and MSI will be ordered and reported separately.  ADDITIONAL INFORMATION: Mismatch Repair (MMR) Protein Immunohistochemistry (IHC) IHC Expression Result: MLH1: Preserved nuclear expression (greater 50% tumor expression) MSH2: Preserved nuclear expression (greater 50% tumor expression) MSH6: Preserved nuclear expression (greater 50% tumor expression) PMS2: Preserved nuclear expression (greater 50% tumor expression) * Internal control demonstrates intact nuclear expression Interpretation: NORMAL There is preserved expression of the major and minor MMR proteins. There is a very low probability that microsatellite instability (MSI) is present. However, certain clinically significant MMR protein mutations may result in preservation of nuclear  expression. It is recommended that the preservation of protein expression be correlated with molecular based MSI testing.  MSI-STABLE (by PCR)  RADIOGRAPHIC STUDIES: I have personally reviewed the radiological images as listed and agreed with the findings in the report. No results found. Colonoscopy 05/25/2015 Dr. Paulita Fujita  Findings: External hemorrhoids with rectal prolapse, otherwise normal digital rectal exam. Prep quality was poor, with solid impenetrable stool obscuring our views, and making passage of colonoscope proximal to the sigmoid colon impossible. There were a few benign-appearing polyps seen in the rectosigmoid colon, which were neither biopsied nor removed. Retroflexion was not performed. Withdrawal time was not recorded, since full colonoscopy was done. . The scope was  withdrawn and the procedure completed.  COMPLICATIONS: None immediate.  ENDOSCOPIC IMPRESSION:  As above. Solid stool in distal colon, despite several days of enema prep.  ASSESSMENT & PLAN:  70 year old African-American male, who has not seen Dr. For my years, presented with small bowel obstruction symptoms.   1. Right side colon cancer, moderately differentiated adenocarcinoma, pT3N0Mx, stage IIA, MMR normal  -I discussed his CT scan findings, and the surgical pathology results in greater details. -he is scheduled to have a CT chest to complete staging workup. -if his CT chest is negative for metastasis, he has stage II disease. No high risk features, such as perforation, LVI, poorly differentiation, etc. He had adequate nodes (17) removal. His postoperative CEA  Was normal -I discussed the risk of cancer recurrence disease, which is moderate.  -I discussed that the role of adjuvant chemotherapy is controversial for stage II colon cancer. Since his cancer has no high risk features, I do not recommend adjuvant chemotherapy. - I discussed the surveillance plan, which includes physical exam and lab test (including CBC, CMP and CEA) every 3 months for the first 2 years, then every 6-12 months, colonoscopy in one year, and surveilliance CT scan every 6-12 month for up to 5 year.  -he does not have full colonoscopy before surgery, I strongly recommend him to follow-up with Dr. Paulita Fujita to complete colonoscopy in the next few months, I will send a message to Dr. Paulita Fujita  -His clinically doing well, laboratory test and exam are unremarkable, CEA result is still pending from today.  2. Genetics -He has family history of ovarian and pancreatic cancer, I recommend genetic referral to rule out  Inheritable genetic syndrome -He was seen by our genetic counselor, but genetic testing was not performed due to the lack of insurance coverage.  3. High blood pressure -His blood pressure was high during his  office visit with me today.  -He has appointment with his new primary care physician Dr.Burns in August.  4. Mild elevated Cr -His creatinine is 1.4 today, increased from 0.8 four months ago -I encouraged him to drink more water. -His can see his primary care physician soon. I'll monitor his kidney function on his next visit.  Plan -Continue surveillance -I encouraged him to discuss his hyperbaric pressure and kidney issue with his new primary care physician on his upcoming visit -I'll see him back in 4 months with lab and exam. I'll order surveillance CT scan on next visit.  All questions were answered. The patient knows to call the clinic with any problems, questions or concerns.  I spent 20 minutes counseling the patient face to face. The total time spent in the appointment was 25 minutes and more than 50% was on counseling.     Truitt Merle, MD 09/29/2015 5:11 PM

## 2015-09-30 LAB — CEA: CEA: 1.4 ng/mL (ref 0.0–4.7)

## 2015-10-24 DIAGNOSIS — Z85038 Personal history of other malignant neoplasm of large intestine: Secondary | ICD-10-CM | POA: Diagnosis not present

## 2015-11-01 ENCOUNTER — Encounter: Payer: Self-pay | Admitting: Internal Medicine

## 2015-11-01 ENCOUNTER — Ambulatory Visit (INDEPENDENT_AMBULATORY_CARE_PROVIDER_SITE_OTHER): Payer: Medicare Other | Admitting: Internal Medicine

## 2015-11-01 ENCOUNTER — Other Ambulatory Visit (INDEPENDENT_AMBULATORY_CARE_PROVIDER_SITE_OTHER): Payer: Medicare Other

## 2015-11-01 VITALS — BP 142/82 | HR 71 | Temp 98.1°F | Resp 16 | Ht 76.0 in | Wt 151.0 lb

## 2015-11-01 DIAGNOSIS — Z125 Encounter for screening for malignant neoplasm of prostate: Secondary | ICD-10-CM

## 2015-11-01 DIAGNOSIS — Z8639 Personal history of other endocrine, nutritional and metabolic disease: Secondary | ICD-10-CM | POA: Diagnosis not present

## 2015-11-01 DIAGNOSIS — Z8739 Personal history of other diseases of the musculoskeletal system and connective tissue: Secondary | ICD-10-CM

## 2015-11-01 DIAGNOSIS — J439 Emphysema, unspecified: Secondary | ICD-10-CM | POA: Insufficient documentation

## 2015-11-01 DIAGNOSIS — Z1159 Encounter for screening for other viral diseases: Secondary | ICD-10-CM | POA: Diagnosis not present

## 2015-11-01 DIAGNOSIS — Z0000 Encounter for general adult medical examination without abnormal findings: Secondary | ICD-10-CM

## 2015-11-01 DIAGNOSIS — Z23 Encounter for immunization: Secondary | ICD-10-CM | POA: Diagnosis not present

## 2015-11-01 DIAGNOSIS — I2510 Atherosclerotic heart disease of native coronary artery without angina pectoris: Secondary | ICD-10-CM | POA: Diagnosis not present

## 2015-11-01 DIAGNOSIS — K6389 Other specified diseases of intestine: Secondary | ICD-10-CM | POA: Diagnosis not present

## 2015-11-01 LAB — CBC WITH DIFFERENTIAL/PLATELET
BASOS PCT: 0.4 % (ref 0.0–3.0)
Basophils Absolute: 0 10*3/uL (ref 0.0–0.1)
EOS PCT: 3.4 % (ref 0.0–5.0)
Eosinophils Absolute: 0.2 10*3/uL (ref 0.0–0.7)
HEMATOCRIT: 42.5 % (ref 39.0–52.0)
HEMOGLOBIN: 14.1 g/dL (ref 13.0–17.0)
LYMPHS PCT: 34 % (ref 12.0–46.0)
Lymphs Abs: 1.6 10*3/uL (ref 0.7–4.0)
MCHC: 33.1 g/dL (ref 30.0–36.0)
MCV: 87 fl (ref 78.0–100.0)
MONO ABS: 0.4 10*3/uL (ref 0.1–1.0)
MONOS PCT: 7.6 % (ref 3.0–12.0)
Neutro Abs: 2.6 10*3/uL (ref 1.4–7.7)
Neutrophils Relative %: 54.6 % (ref 43.0–77.0)
Platelets: 234 10*3/uL (ref 150.0–400.0)
RBC: 4.89 Mil/uL (ref 4.22–5.81)
RDW: 15.9 % — AB (ref 11.5–15.5)
WBC: 4.7 10*3/uL (ref 4.0–10.5)

## 2015-11-01 LAB — PSA, MEDICARE: PSA: 0.84 ng/mL (ref 0.10–4.00)

## 2015-11-01 LAB — COMPREHENSIVE METABOLIC PANEL
ALBUMIN: 4 g/dL (ref 3.5–5.2)
ALK PHOS: 60 U/L (ref 39–117)
ALT: 11 U/L (ref 0–53)
AST: 19 U/L (ref 0–37)
BUN: 9 mg/dL (ref 6–23)
CALCIUM: 9.4 mg/dL (ref 8.4–10.5)
CHLORIDE: 105 meq/L (ref 96–112)
CO2: 25 mEq/L (ref 19–32)
Creatinine, Ser: 1.09 mg/dL (ref 0.40–1.50)
GFR: 86.61 mL/min (ref >60.00)
Glucose, Bld: 104 mg/dL — ABNORMAL HIGH (ref 70–99)
POTASSIUM: 4.1 meq/L (ref 3.5–5.1)
Sodium: 140 mEq/L (ref 135–145)
TOTAL PROTEIN: 7.1 g/dL (ref 6.0–8.3)
Total Bilirubin: 0.5 mg/dL (ref 0.2–1.2)

## 2015-11-01 LAB — LIPID PANEL
CHOLESTEROL: 196 mg/dL (ref 0–200)
HDL: 99.5 mg/dL (ref >39.00)
LDL CALC: 89 mg/dL (ref 0–99)
NonHDL: 96.46
TRIGLYCERIDES: 37 mg/dL (ref 0.0–149.0)
Total CHOL/HDL Ratio: 2
VLDL: 7.4 mg/dL (ref 0.0–40.0)

## 2015-11-01 LAB — TSH: TSH: 1.37 u[IU]/mL (ref 0.35–4.50)

## 2015-11-01 LAB — URIC ACID: Uric Acid, Serum: 4.7 mg/dL (ref 4.0–7.8)

## 2015-11-01 MED ORDER — COLCHICINE 0.6 MG PO TABS: ORAL_TABLET | ORAL | 11 refills | Status: DC

## 2015-11-01 NOTE — Assessment & Plan Note (Signed)
Still smoking, not interested in quitting, but understands the importance of quitting

## 2015-11-01 NOTE — Assessment & Plan Note (Signed)
No symptoms, no history of CAD

## 2015-11-01 NOTE — Patient Instructions (Signed)
  Test(s) ordered today. Your results will be released to [place] (or called to you) after review, usually within 72hours after test completion. If any changes need to be made, you will be notified at that same time.  All other Health Maintenance issues reviewed.   All recommended immunizations and age-appropriate screenings are up-to-date or discussed.  prevnar vaccine administered today.   Medications reviewed and updated.  No changes recommended at this time. A prescription was sent to your pharmacy for colchicine for a gout attack if you have one.   Your prescription(s) have been submitted to your pharmacy. Please take as directed and contact our office if you believe you are having problem(s) with the medication(s).   Please followup in 6 months

## 2015-11-01 NOTE — Assessment & Plan Note (Signed)
Two episodes in the past few months Will check uric acid level Mom had gout Consider allopurinol Colchicine given to be used if needed

## 2015-11-01 NOTE — Progress Notes (Signed)
Pre visit review using our clinic review tool, if applicable. No additional management support is needed unless otherwise documented below in the visit note. 
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
## 2015-11-02 LAB — HEPATITIS C ANTIBODY: HCV Ab: NEGATIVE

## 2015-11-08 ENCOUNTER — Encounter: Payer: Self-pay | Admitting: Internal Medicine

## 2015-11-24 ENCOUNTER — Other Ambulatory Visit: Payer: Self-pay | Admitting: Gastroenterology

## 2015-11-25 ENCOUNTER — Other Ambulatory Visit: Payer: Self-pay | Admitting: Gastroenterology

## 2015-11-25 ENCOUNTER — Encounter (HOSPITAL_COMMUNITY): Payer: Self-pay | Admitting: *Deleted
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
## 2015-11-28 ENCOUNTER — Ambulatory Visit (HOSPITAL_COMMUNITY)
Admission: RE | Admit: 2015-11-28 | Discharge: 2015-11-28 | Disposition: A | Discharge status: Home / Self Care | Payer: Medicare Other | Source: Ambulatory Visit | Attending: Gastroenterology | Admitting: Gastroenterology

## 2015-11-28 ENCOUNTER — Ambulatory Visit (HOSPITAL_COMMUNITY): Payer: Medicare Other | Admitting: Certified Registered Nurse Anesthetist

## 2015-11-28 ENCOUNTER — Encounter (HOSPITAL_COMMUNITY): Payer: Self-pay

## 2015-11-28 ENCOUNTER — Encounter (HOSPITAL_COMMUNITY)
Admission: RE | Disposition: A | Discharge status: Home / Self Care | Payer: Self-pay | Source: Ambulatory Visit | Attending: Gastroenterology

## 2015-11-28 DIAGNOSIS — D123 Benign neoplasm of transverse colon: Secondary | ICD-10-CM | POA: Diagnosis not present

## 2015-11-28 DIAGNOSIS — J449 Chronic obstructive pulmonary disease, unspecified: Secondary | ICD-10-CM | POA: Diagnosis not present

## 2015-11-28 DIAGNOSIS — D125 Benign neoplasm of sigmoid colon: Secondary | ICD-10-CM | POA: Insufficient documentation

## 2015-11-28 DIAGNOSIS — Z85038 Personal history of other malignant neoplasm of large intestine: Secondary | ICD-10-CM | POA: Diagnosis not present

## 2015-11-28 DIAGNOSIS — K635 Polyp of colon: Secondary | ICD-10-CM | POA: Insufficient documentation

## 2015-11-28 DIAGNOSIS — K621 Rectal polyp: Secondary | ICD-10-CM | POA: Diagnosis not present

## 2015-11-28 DIAGNOSIS — F17200 Nicotine dependence, unspecified, uncomplicated: Secondary | ICD-10-CM | POA: Insufficient documentation

## 2015-11-28 DIAGNOSIS — Z483 Aftercare following surgery for neoplasm: Secondary | ICD-10-CM | POA: Diagnosis not present

## 2015-11-28 DIAGNOSIS — Z9049 Acquired absence of other specified parts of digestive tract: Secondary | ICD-10-CM | POA: Insufficient documentation

## 2015-11-28 DIAGNOSIS — D127 Benign neoplasm of rectosigmoid junction: Secondary | ICD-10-CM | POA: Diagnosis not present

## 2015-11-28 HISTORY — DX: Malignant neoplasm of colon, unspecified: C18.9

## 2015-11-28 HISTORY — PX: COLONOSCOPY WITH PROPOFOL: SHX5780

## 2015-11-28 SURGERY — COLONOSCOPY WITH PROPOFOL
Anesthesia: Monitor Anesthesia Care

## 2015-11-28 MED ORDER — EPHEDRINE SULFATE-NACL 50-0.9 MG/10ML-% IV SOSY
PREFILLED_SYRINGE | INTRAVENOUS | Status: DC | PRN
Start: 2015-11-28 — End: 2015-11-28
  Administered 2015-11-28: 5 mg via INTRAVENOUS

## 2015-11-28 MED ORDER — PROPOFOL 10 MG/ML IV BOLUS
INTRAVENOUS | Status: DC | PRN
  Administered 2015-11-28 (×2): 10 mg via INTRAVENOUS
  Administered 2015-11-28: 15 mg via INTRAVENOUS

## 2015-11-28 MED ORDER — SODIUM CHLORIDE 0.9 % IV SOLN: INTRAVENOUS | Status: DC

## 2015-11-28 MED ORDER — PROPOFOL 500 MG/50ML IV EMUL
INTRAVENOUS | Status: DC | PRN
  Administered 2015-11-28: 100 ug/kg/min via INTRAVENOUS

## 2015-11-28 MED ORDER — LACTATED RINGERS IV SOLN
INTRAVENOUS | Status: DC
  Administered 2015-11-28: 11:00:00 via INTRAVENOUS

## 2015-11-28 MED ORDER — PHENYLEPHRINE 40 MCG/ML (10ML) SYRINGE FOR IV PUSH (FOR BLOOD PRESSURE SUPPORT)
PREFILLED_SYRINGE | INTRAVENOUS | Status: DC | PRN
  Administered 2015-11-28 (×2): 80 ug via INTRAVENOUS
  Administered 2015-11-28: 40 ug via INTRAVENOUS
  Administered 2015-11-28: 80 ug via INTRAVENOUS
  Administered 2015-11-28: 40 ug via INTRAVENOUS

## 2015-11-28 NOTE — Anesthesia Postprocedure Evaluation (Signed)
Anesthesia Post Note  Patient: Christian Black  Procedure(s) Performed: Procedure(s) (LRB): COLONOSCOPY WITH PROPOFOL (N/A)  Patient location during evaluation: PACU Anesthesia Type: MAC Level of consciousness: awake and alert Pain management: pain level controlled Vital Signs Assessment: post-procedure vital signs reviewed and stable Respiratory status: spontaneous breathing, nonlabored ventilation, respiratory function stable and patient connected to nasal cannula oxygen Cardiovascular status: stable and blood pressure returned to baseline Anesthetic complications: no    Last Vitals:  Vitals:   11/28/15 1230 11/28/15 1240  BP:  (!) 186/138  Pulse: 63 (!) 52  Resp:    Temp:      Last Pain:  Vitals:   11/28/15 1240  TempSrc:   PainSc: 0-No pain                 Tiajuana Amass

## 2015-11-28 NOTE — Op Note (Signed)
[facility] Patient Name: Christian Black Procedure Date : 11/28/2015 MRN: [id number] Attending MD: Arta Silence , MD Date of Birth: [birth year] CSN: [id number] Age: 70 Admit Type: Outpatient Procedure:                Colonoscopy Indications:              Personal history of malignant neoplasm of the colon Providers:                Arta Silence, MD, Vista Lawman, RN, Caidance Sybert Dalton, Technician Referring MD:              Medicines:                Monitored Anesthesia Care Complications:            No immediate complications. Estimated Blood Loss:     Estimated blood loss was minimal. Procedure:                Pre-Anesthesia Assessment:                           - Prior to the procedure, a History and Physical                            was performed, and patient medications and                            allergies were reviewed. The patient's tolerance of                            previous anesthesia was also reviewed. The risks                            and benefits of the procedure and the sedation                            options and risks were discussed with the patient.                            All questions were answered, and informed consent                            was obtained. Prior Anticoagulants: The patient has                            taken no previous anticoagulant or antiplatelet                            agents. ASA Grade Assessment: III - A patient with                            severe systemic disease. After reviewing the risks  and benefits, the patient was deemed in                            satisfactory condition to undergo the procedure.                           After obtaining informed consent, the colonoscope                            was passed under direct vision. Throughout the                            procedure, the patient's blood pressure, pulse, and                             oxygen saturations were monitored continuously. The                            [id number] scope was introduced through                            the anus and advanced to the the ileocolonic                            anastomosis. Ileocolic anastomosis reached, but                            technical errors precluded photo saving of the                            anastomosis. The entire colon was examined. The                            colonoscopy was performed without difficulty. The                            patient tolerated the procedure well. The quality                            of the bowel preparation was good. Scope In: 11:16:54 AM Scope Out: 11:55:22 AM Scope Withdrawal Time: 0 hours 34 minutes 7 seconds  Total Procedure Duration: 0 hours 38 minutes 28 seconds  Findings:      Small rectal prolapse seen.      Normal-appearing anastomosis post right hemicolectomy. Normal-appearing       neoterminal ileum.      Many variable-shaped sessile and umbilicated and semi-pedunculated       polyps were found in the rectum, sigmoid colon and transverse colon. The       polyps were 6 to 20 mm in size. These polyps were removed with a hot       snare. Resection and retrieval were complete.      Colon otherwise normal; no other polyps, masses, vascular ectasias, or       inflammatory changes were seen.      The retroflexed view of the distal rectum and  anal verge was normal and       showed no anal or rectal abnormalities. Impression:               - Many 6 to 20 mm polyps in the rectum, in the                            sigmoid colon and in the transverse colon, removed                            with a hot snare. Resected and retrieved.                           - The distal rectum and anal verge are normal on                            retroflexion view. Moderate Sedation:      None Recommendation:           - Patient has a contact number available for                             emergencies. The signs and symptoms of potential                            delayed complications were discussed with the                            patient. Return to normal activities tomorrow.                            Written discharge instructions were provided to the                            patient.                           - Discharge patient to home (via wheelchair).                           - Resume previous diet today.                           - No aspirin, ibuprofen, naproxen, or other                            non-steroidal anti-inflammatory drugs for 5 days                            after polyp removal.                           - Return to GI clinic PRN.                           - Return to referring physician as previously  scheduled.                           - Await pathology results.                           - Repeat colonoscopy at appointment to be scheduled                            for surveillance based on pathology results. (most                            likely 2-3 years). Procedure Code(s):        --- Professional ---                           [phone], Colonoscopy, flexible; with removal of                            tumor(s), polyp(s), or other lesion(s) by snare                            technique Diagnosis Code(s):        --- Professional ---                           K62.1, Rectal polyp                           D12.5, Benign neoplasm of sigmoid colon                           D12.3, Benign neoplasm of transverse colon (hepatic                            flexure or splenic flexure)                           Z85.038, Personal history of other malignant                            neoplasm of large intestine CPT copyright 2016 [employer]. All rights reserved. The codes documented in this report are preliminary and upon coder review may  be revised to meet current compliance  requirements. Arta Silence, MD 11/28/2015 12:03:15 PM This report has been signed electronically. Number of Addenda: 0

## 2015-11-28 NOTE — Discharge Instructions (Signed)
Colonoscopy, Care After °Refer to this sheet in the next few weeks. These instructions provide you with information on caring for yourself after your procedure. Your health care provider may also give you more specific instructions. Your treatment has been planned according to current medical practices, but problems sometimes occur. Call your health care provider if you have any problems or questions after your procedure. °WHAT TO EXPECT AFTER THE PROCEDURE  °After your procedure, it is typical to have the following: °· A small amount of blood in your stool. °· Moderate amounts of gas and mild abdominal cramping or bloating. °HOME CARE INSTRUCTIONS °· Do not drive, operate machinery, or sign important documents for 24 hours. °· You may shower and resume your regular physical activities, but move at a slower pace for the first 24 hours. °· Take frequent rest periods for the first 24 hours. °· Walk around or put a warm pack on your abdomen to help reduce abdominal cramping and bloating. °· Drink enough fluids to keep your urine clear or pale yellow. °· You may resume your normal diet as instructed by your health care provider. Avoid heavy or fried foods that are hard to digest. °· Avoid drinking alcohol for 24 hours or as instructed by your health care provider. °· Only take over-the-counter or prescription medicines as directed by your health care provider. °· If a tissue sample (biopsy) was taken during your procedure: °¨ Do not take aspirin or blood thinners for 7 days, or as instructed by your health care provider. °¨ Do not drink alcohol for 7 days, or as instructed by your health care provider. °¨ Eat soft foods for the first 24 hours. °SEEK MEDICAL CARE IF: °You have persistent spotting of blood in your stool 2-3 days after the procedure. °SEEK IMMEDIATE MEDICAL CARE IF: °· You have more than a small spotting of blood in your stool. °· You pass large blood clots in your stool. °· Your abdomen is swollen  (distended). °· You have nausea or vomiting. °· You have a fever. °· You have increasing abdominal pain that is not relieved with medicine. °  °This information is not intended to replace advice given to you by your health care provider. Make sure you discuss any questions you have with your health care provider. °  °Document Released: 11/01/2003 Document Revised: 01/07/2013 Document Reviewed: 11/24/2012 °Elsevier Interactive Patient Education ©2016 [employer] ° °

## 2015-11-28 NOTE — H&P (Signed)
Patient interval history reviewed.  Patient examined again.  There has been no change from documented H/P dated 11/24/15 (scanned into chart from our office) except as documented above.  Assessment:  1.  Personal history of colon cancer.  Plan:  1.  Colonoscopy. 2.  Risks (bleeding, infection, bowel perforation that could require surgery, sedation-related changes in cardiopulmonary systems), benefits (identification and possible treatment of source of symptoms, exclusion of certain causes of symptoms), and alternatives (watchful waiting, radiographic imaging studies, empiric medical treatment) of colonoscopy were explained to patient/family in detail and patient wishes to proceed.

## 2015-11-28 NOTE — Transfer of Care (Signed)
Immediate Anesthesia Transfer of Care Note  Patient: Christian Black  Procedure(s) Performed: Procedure(s): COLONOSCOPY WITH PROPOFOL (N/A)  Patient Location: Endoscopy Unit  Anesthesia Type:MAC  Level of Consciousness: awake, alert , oriented and patient cooperative  Airway & Oxygen Therapy: Patient Spontanous Breathing and Patient connected to nasal cannula oxygen  Post-op Assessment: Report given to RN and Post -op Vital signs reviewed and stable  Post vital signs: Reviewed and stable  Last Vitals:  Vitals:   11/28/15 1026  BP: (!) 208/78  Pulse: (!) 55  Resp: 14    Last Pain:  Vitals:   11/28/15 1026  TempSrc: Oral         Complications: No apparent anesthesia complications

## 2015-11-28 NOTE — Anesthesia Procedure Notes (Signed)
Procedure Name: MAC Date/Time: 11/28/2015 11:12 AM Performed by: Salli Quarry Zubair Lofton Pre-anesthesia Checklist: Patient identified, Emergency Drugs available, Suction available and Patient being monitored Oxygen Delivery Method: Nasal cannula

## 2015-11-28 NOTE — Anesthesia Preprocedure Evaluation (Addendum)
Anesthesia Evaluation  Patient identified by MRN, date of birth, ID band Patient awake    Reviewed: Allergy & Precautions, NPO status , Patient's Chart, lab work & pertinent test results  Airway Mallampati: II  TM Distance: >3 FB Neck ROM: Full    Dental   Pulmonary COPD, Current Smoker,    breath sounds clear to auscultation       Cardiovascular negative cardio ROS   Rhythm:Regular Rate:Normal     Neuro/Psych negative neurological ROS     GI/Hepatic Neg liver ROS, Colon CA. S/p resection   Endo/Other  negative endocrine ROS  Renal/GU negative Renal ROS     Musculoskeletal   Abdominal   Peds  Hematology negative hematology ROS (+)   Anesthesia Other Findings   Reproductive/Obstetrics                            Lab Results  Component Value Date   WBC 4.7 11/01/2015   HGB 14.1 11/01/2015   HCT 42.5 11/01/2015   MCV 87.0 11/01/2015   PLT 234.0 11/01/2015   Lab Results  Component Value Date   CREATININE 1.09 11/01/2015   BUN 9 11/01/2015   NA 140 11/01/2015   K 4.1 11/01/2015   CL 105 11/01/2015   CO2 25 11/01/2015    Anesthesia Physical Anesthesia Plan  ASA: II  Anesthesia Plan: MAC   Post-op Pain Management:    Induction: Intravenous  Airway Management Planned: Nasal Cannula, Natural Airway and Simple Face Mask  Additional Equipment:   Intra-op Plan:   Post-operative Plan:   Informed Consent: I have reviewed the patients History and Physical, chart, labs and discussed the procedure including the risks, benefits and alternatives for the proposed anesthesia with the patient or authorized representative who has indicated his/her understanding and acceptance.     Plan Discussed with: CRNA  Anesthesia Plan Comments:         Anesthesia Quick Evaluation

## 2015-11-29 ENCOUNTER — Encounter (HOSPITAL_COMMUNITY): Payer: Self-pay | Admitting: Gastroenterology

## 2016-01-30 ENCOUNTER — Ambulatory Visit (HOSPITAL_BASED_OUTPATIENT_CLINIC_OR_DEPARTMENT_OTHER): Payer: Medicare Other | Admitting: Hematology

## 2016-01-30 ENCOUNTER — Encounter: Payer: Self-pay | Admitting: Hematology

## 2016-01-30 ENCOUNTER — Telehealth: Payer: Self-pay | Admitting: Hematology

## 2016-01-30 ENCOUNTER — Other Ambulatory Visit (HOSPITAL_BASED_OUTPATIENT_CLINIC_OR_DEPARTMENT_OTHER): Payer: Medicare Other

## 2016-01-30 VITALS — BP 150/78 | HR 70 | Temp 98.5°F | Resp 16 | Ht 76.0 in | Wt 153.2 lb

## 2016-01-30 DIAGNOSIS — C182 Malignant neoplasm of ascending colon: Secondary | ICD-10-CM

## 2016-01-30 DIAGNOSIS — R030 Elevated blood-pressure reading, without diagnosis of hypertension: Secondary | ICD-10-CM | POA: Diagnosis not present

## 2016-01-30 LAB — CBC WITH DIFFERENTIAL/PLATELET
BASO%: 0.6 % (ref 0.0–2.0)
BASOS ABS: 0 10*3/uL (ref 0.0–0.1)
EOS%: 4.6 % (ref 0.0–7.0)
Eosinophils Absolute: 0.2 10*3/uL (ref 0.0–0.5)
HEMATOCRIT: 45.4 % (ref 38.4–49.9)
HGB: 14.8 g/dL (ref 13.0–17.1)
LYMPH%: 31 % (ref 14.0–49.0)
MCH: 29.5 pg (ref 27.2–33.4)
MCHC: 32.6 g/dL (ref 32.0–36.0)
MCV: 90.4 fL (ref 79.3–98.0)
MONO#: 0.4 10*3/uL (ref 0.1–0.9)
MONO%: 7.5 % (ref 0.0–14.0)
NEUT#: 2.8 10*3/uL (ref 1.5–6.5)
NEUT%: 56.3 % (ref 39.0–75.0)
Platelets: 221 10*3/uL (ref 140–400)
RBC: 5.02 10*6/uL (ref 4.20–5.82)
RDW: 15.7 % — ABNORMAL HIGH (ref 11.0–14.6)
WBC: 4.9 10*3/uL (ref 4.0–10.3)
lymph#: 1.5 10*3/uL (ref 0.9–3.3)

## 2016-01-30 LAB — COMPREHENSIVE METABOLIC PANEL
ALT: 9 U/L (ref 0–55)
AST: 16 U/L (ref 5–34)
Albumin: 3.1 g/dL — ABNORMAL LOW (ref 3.5–5.0)
Alkaline Phosphatase: 71 U/L (ref 40–150)
Anion Gap: 8 mEq/L (ref 3–11)
BUN: 14.1 mg/dL (ref 7.0–26.0)
CALCIUM: 9.2 mg/dL (ref 8.4–10.4)
CHLORIDE: 106 meq/L (ref 98–109)
CO2: 28 meq/L (ref 22–29)
Creatinine: 1.1 mg/dL (ref 0.7–1.3)
EGFR: 77 mL/min/{1.73_m2} — ABNORMAL LOW (ref >90)
Glucose: 116 mg/dl (ref 70–140)
POTASSIUM: 4.1 meq/L (ref 3.5–5.1)
SODIUM: 141 meq/L (ref 136–145)
Total Bilirubin: 0.63 mg/dL (ref 0.20–1.20)
Total Protein: 7.7 g/dL (ref 6.4–8.3)

## 2016-01-30 LAB — CEA (IN HOUSE-CHCC)

## 2016-01-30 NOTE — Telephone Encounter (Signed)
Lab and follow up appointment scheduled per 01/30/16 los. 2 bottles of Contrast, along with instructions was given to patient per 01/30/16 los. AVS report and appointment schedule was given to patient per 01/30/16 los.
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
## 2016-01-31 LAB — CEA: CEA: 1.6 ng/mL (ref 0.0–4.7)

## 2016-05-03 ENCOUNTER — Encounter: Payer: Self-pay | Admitting: Internal Medicine

## 2016-05-03 ENCOUNTER — Telehealth: Payer: Self-pay | Admitting: Internal Medicine

## 2016-05-03 ENCOUNTER — Ambulatory Visit (INDEPENDENT_AMBULATORY_CARE_PROVIDER_SITE_OTHER): Payer: Medicare Other | Admitting: Internal Medicine

## 2016-05-03 VITALS — BP 144/80 | HR 69 | Temp 98.0°F | Resp 16 | Wt 166.0 lb

## 2016-05-03 DIAGNOSIS — R9431 Abnormal electrocardiogram [ECG] [EKG]: Secondary | ICD-10-CM

## 2016-05-03 DIAGNOSIS — I2510 Atherosclerotic heart disease of native coronary artery without angina pectoris: Secondary | ICD-10-CM | POA: Diagnosis not present

## 2016-05-03 DIAGNOSIS — J439 Emphysema, unspecified: Secondary | ICD-10-CM

## 2016-05-03 DIAGNOSIS — Z0000 Encounter for general adult medical examination without abnormal findings: Secondary | ICD-10-CM

## 2016-05-03 DIAGNOSIS — R030 Elevated blood-pressure reading, without diagnosis of hypertension: Secondary | ICD-10-CM | POA: Diagnosis not present

## 2016-05-03 DIAGNOSIS — Z23 Encounter for immunization: Secondary | ICD-10-CM

## 2016-05-03 NOTE — Telephone Encounter (Signed)
Call him - his EKG is abnormal and very suggestive of heart disease or blockages in his heart.  He needs to have a stress test and I have ordered that.    If for some reason he experiences any chest pain, sob, palpitations or lightheadedness he should call us immediately.

## 2016-05-03 NOTE — Assessment & Plan Note (Signed)
Denies SOB, cough or wheeze Stressed smoking cessation
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
## 2016-05-03 NOTE — Assessment & Plan Note (Signed)
EKG abnormal today - shows likely ischemia Will order Echo stress test Will likely need to see cardio  Will not start any medications at this time - will wait for results of stress test

## 2016-05-03 NOTE — Progress Notes (Signed)
Pre visit review using our clinic review tool, if applicable. No additional management support is needed unless otherwise documented below in the visit note. 

## 2016-05-03 NOTE — Patient Instructions (Addendum)
Christian Black , Thank you for taking time to come for your Medicare Wellness Visit. I appreciate your ongoing commitment to your health goals. Please review the following plan we discussed and let me know if I can assist you in the future.   These are the goals we discussed: Goals    Work on quitting smoking, monitor your BP if you can      This is a list of the screening recommended for you and due dates:  Health Maintenance  Topic Date Due  . Tetanus Vaccine  03/13/1967  . Shingles Vaccine  03/12/2008  . Flu Shot  06/08/2016*  . Pneumonia vaccines (2 of 2 - PPSV23) 10/31/2016  . Colon Cancer Screening  11/27/2025  .  Hepatitis C: One time screening is recommended by Center for Disease Control  (CDC) for  adults born from [birth year] through 1963.   Completed  *Topic was postponed. The date shown is not the original due date.     No need for blood work today.  All other Health Maintenance issues reviewed.   All recommended immunizations and age-appropriate screenings are up-to-date or discussed.  Flu immunization administered today.   Medications reviewed and updated.  No changes recommended at this time.  An EKG was done today  Please followup in one year   Health Maintenance, Male A healthy lifestyle and preventative care can promote health and wellness.  Maintain regular health, dental, and eye exams.  Eat a healthy diet. Foods like vegetables, fruits, whole grains, low-fat dairy products, and lean protein foods contain the nutrients you need and are low in calories. Decrease your intake of foods high in solid fats, added sugars, and salt. Get information about a proper diet from your health care provider, if necessary.  Regular physical exercise is one of the most important things you can do for your health. Most adults should get at least 150 minutes of moderate-intensity exercise (any activity that increases your heart rate and causes you to sweat) each week. In addition, most  adults need muscle-strengthening exercises on 2 or more days a week.   Maintain a healthy weight. The body mass index (BMI) is a screening tool to identify possible weight problems. It provides an estimate of body fat based on height and weight. Your health care provider can find your BMI and can help you achieve or maintain a healthy weight. For males 20 years and older:  A BMI below 18.5 is considered underweight.  A BMI of 18.5 to 24.9 is normal.  A BMI of 25 to 29.9 is considered overweight.  A BMI of 30 and above is considered obese.  Maintain normal blood lipids and cholesterol by exercising and minimizing your intake of saturated fat. Eat a balanced diet with plenty of fruits and vegetables. Blood tests for lipids and cholesterol should begin at age 28 and be repeated every 5 years. If your lipid or cholesterol levels are high, you are over age 11, or you are at high risk for heart disease, you may need your cholesterol levels checked more frequently.Ongoing high lipid and cholesterol levels should be treated with medicines if diet and exercise are not working.  If you smoke, find out from your health care provider how to quit. If you do not use tobacco, do not start.  Lung cancer screening is recommended for adults aged 8-80 years who are at high risk for developing lung cancer because of a history of smoking. A yearly low-dose CT scan of the  lungs is recommended for people who have at least a 30-pack-year history of smoking and are current smokers or have quit within the past 15 years. A pack year of smoking is smoking an average of 1 pack of cigarettes a day for 1 year (for example, a 30-pack-year history of smoking could mean smoking 1 pack a day for 30 years or 2 packs a day for 15 years). Yearly screening should continue until the smoker has stopped smoking for at least 15 years. Yearly screening should be stopped for people who develop a health problem that would prevent them from  having lung cancer treatment.  If you choose to drink alcohol, do not have more than 2 drinks per day. One drink is considered to be 12 oz (360 mL) of beer, 5 oz (150 mL) of wine, or 1.5 oz (45 mL) of liquor.  Avoid the use of street drugs. Do not share needles with anyone. Ask for help if you need support or instructions about stopping the use of drugs.  High blood pressure causes heart disease and increases the risk of stroke. High blood pressure is more likely to develop in:  People who have blood pressure in the end of the normal range (100-139/85-89 mm Hg).  People who are overweight or obese.  People who are African American.  If you are 66-45 years of age, have your blood pressure checked every 3-5 years. If you are 53 years of age or older, have your blood pressure checked every year. You should have your blood pressure measured twice-once when you are at a hospital or clinic, and once when you are not at a hospital or clinic. Record the average of the two measurements. To check your blood pressure when you are not at a hospital or clinic, you can use:  An automated blood pressure machine at a pharmacy.  A home blood pressure monitor.  If you are 13-31 years old, ask your health care provider if you should take aspirin to prevent heart disease.  Diabetes screening involves taking a blood sample to check your fasting blood sugar level. This should be done once every 3 years after age 88 if you are at a normal weight and without risk factors for diabetes. Testing should be considered at a younger age or be carried out more frequently if you are overweight and have at least 1 risk factor for diabetes.  Colorectal cancer can be detected and often prevented. Most routine colorectal cancer screening begins at the age of 42 and continues through age 35. However, your health care provider may recommend screening at an earlier age if you have risk factors for colon cancer. On a yearly basis,  your health care provider may provide home test kits to check for hidden blood in the stool. A small camera at the end of a tube may be used to directly examine the colon (sigmoidoscopy or colonoscopy) to detect the earliest forms of colorectal cancer. Talk to your health care provider about this at age 15 when routine screening begins. A direct exam of the colon should be repeated every 5-10 years through age 78, unless early forms of precancerous polyps or small growths are found.  People who are at an increased risk for hepatitis B should be screened for this virus. You are considered at high risk for hepatitis B if:  You were born in a country where hepatitis B occurs often. Talk with your health care provider about which countries are considered high risk.  Your parents were born in a high-risk country and you have not received a shot to protect against hepatitis B (hepatitis B vaccine).  You have HIV or AIDS.  You use needles to inject street drugs.  You live with, or have sex with, someone who has hepatitis B.  You are a man who has sex with other men (MSM).  You get hemodialysis treatment.  You take certain medicines for conditions like cancer, organ transplantation, and autoimmune conditions.  Hepatitis C blood testing is recommended for all people born from [birth year] through 1963 and any individual with known risk factors for hepatitis C.  Healthy men should no longer receive prostate-specific antigen (PSA) blood tests as part of routine cancer screening. Talk to your health care provider about prostate cancer screening.  Testicular cancer screening is not recommended for adolescents or adult males who have no symptoms. Screening includes self-exam, a health care provider exam, and other screening tests. Consult with your health care provider about any symptoms you have or any concerns you have about testicular cancer.  Practice safe sex. Use condoms and avoid high-risk sexual  practices to reduce the spread of sexually transmitted infections (STIs).  You should be screened for STIs, including gonorrhea and chlamydia if:  You are sexually active and are younger than 24 years.  You are older than 24 years, and your health care provider tells you that you are at risk for this type of infection.  Your sexual activity has changed since you were last screened, and you are at an increased risk for chlamydia or gonorrhea. Ask your health care provider if you are at risk.  If you are at risk of being infected with HIV, it is recommended that you take a prescription medicine daily to prevent HIV infection. This is called pre-exposure prophylaxis (PrEP). You are considered at risk if:  You are a man who has sex with other men (MSM).  You are a heterosexual man who is sexually active with multiple partners.  You take drugs by injection.  You are sexually active with a partner who has HIV.  Talk with your health care provider about whether you are at high risk of being infected with HIV. If you choose to begin PrEP, you should first be tested for HIV. You should then be tested every 3 months for as long as you are taking PrEP.  Use sunscreen. Apply sunscreen liberally and repeatedly throughout the day. You should seek shade when your shadow is shorter than you. Protect yourself by wearing long sleeves, pants, a wide-brimmed hat, and sunglasses year round whenever you are outdoors.  Tell your health care provider of new moles or changes in moles, especially if there is a change in shape or color. Also, tell your health care provider if a mole is larger than the size of a pencil eraser.  A one-time screening for abdominal aortic aneurysm (AAA) and surgical repair of large AAAs by ultrasound is recommended for men aged 38-75 years who are current or former smokers.  Stay current with your vaccines (immunizations). This information is not intended to replace advice given to  you by your health care provider. Make sure you discuss any questions you have with your health care provider. Document Released: 09/15/2007 Document Revised: 04/09/2014 Document Reviewed: 12/21/2014 Elsevier Interactive Patient Education  2017 [employer].

## 2016-05-03 NOTE — Telephone Encounter (Signed)
Spoke with pt's wife to inform.  

## 2016-05-08 ENCOUNTER — Telehealth (HOSPITAL_COMMUNITY): Payer: Self-pay | Admitting: *Deleted

## 2016-05-08 NOTE — Telephone Encounter (Signed)
Patient given detailed instructions per Stress Test Requisition Sheet for test on 05/10/16 at 7:30.Patient Notified to arrive 30 minutes early, and that it is imperative to arrive on time for appointment to keep from having the test rescheduled.  Patient verbalized understanding. Christian Black

## 2016-05-10 ENCOUNTER — Ambulatory Visit (HOSPITAL_COMMUNITY): Payer: Medicare Other | Attending: Internal Medicine

## 2016-05-10 ENCOUNTER — Ambulatory Visit (HOSPITAL_BASED_OUTPATIENT_CLINIC_OR_DEPARTMENT_OTHER): Payer: Medicare Other

## 2016-05-10 ENCOUNTER — Other Ambulatory Visit: Payer: Self-pay | Admitting: Internal Medicine

## 2016-05-10 DIAGNOSIS — I5189 Other ill-defined heart diseases: Secondary | ICD-10-CM | POA: Diagnosis not present

## 2016-05-10 DIAGNOSIS — R0989 Other specified symptoms and signs involving the circulatory and respiratory systems: Secondary | ICD-10-CM

## 2016-05-10 DIAGNOSIS — R9439 Abnormal result of other cardiovascular function study: Secondary | ICD-10-CM

## 2016-05-10 DIAGNOSIS — R9431 Abnormal electrocardiogram [ECG] [EKG]: Secondary | ICD-10-CM | POA: Insufficient documentation

## 2016-05-23 ENCOUNTER — Encounter: Payer: Self-pay | Admitting: Cardiovascular Disease

## 2016-05-23 ENCOUNTER — Ambulatory Visit (INDEPENDENT_AMBULATORY_CARE_PROVIDER_SITE_OTHER): Payer: Medicare Other | Admitting: Cardiovascular Disease

## 2016-05-23 VITALS — BP 159/84 | HR 84 | Ht 76.0 in | Wt 166.8 lb

## 2016-05-23 DIAGNOSIS — I10 Essential (primary) hypertension: Secondary | ICD-10-CM

## 2016-05-23 DIAGNOSIS — Z13220 Encounter for screening for lipoid disorders: Secondary | ICD-10-CM

## 2016-05-23 DIAGNOSIS — Z7901 Long term (current) use of anticoagulants: Secondary | ICD-10-CM | POA: Diagnosis not present

## 2016-05-23 DIAGNOSIS — Z01812 Encounter for preprocedural laboratory examination: Secondary | ICD-10-CM

## 2016-05-23 DIAGNOSIS — R9439 Abnormal result of other cardiovascular function study: Secondary | ICD-10-CM

## 2016-05-23 DIAGNOSIS — Z79899 Other long term (current) drug therapy: Secondary | ICD-10-CM | POA: Diagnosis not present

## 2016-05-23 DIAGNOSIS — Z720 Tobacco use: Secondary | ICD-10-CM

## 2016-05-23 HISTORY — DX: Tobacco use: Z72.0

## 2016-05-23 HISTORY — DX: Essential (primary) hypertension: I10

## 2016-05-23 LAB — CBC WITH DIFFERENTIAL/PLATELET
BASOS PCT: 0 %
Basophils Absolute: 0 cells/uL (ref 0–200)
EOS ABS: 135 {cells}/uL (ref 15–500)
Eosinophils Relative: 3 %
HEMATOCRIT: 43.4 % (ref 38.5–50.0)
HEMOGLOBIN: 14.5 g/dL (ref 13.2–17.1)
LYMPHS ABS: 1575 {cells}/uL (ref 850–3900)
LYMPHS PCT: 35 %
MCH: 29.5 pg (ref 27.0–33.0)
MCHC: 33.4 g/dL (ref 32.0–36.0)
MCV: 88.2 fL (ref 80.0–100.0)
MONO ABS: 405 {cells}/uL (ref 200–950)
MPV: 9 fL (ref 7.5–12.5)
Monocytes Relative: 9 %
NEUTROS ABS: 2385 {cells}/uL (ref 1500–7800)
Neutrophils Relative %: 53 %
Platelets: 186 10*3/uL (ref 140–400)
RBC: 4.92 MIL/uL (ref 4.20–5.80)
RDW: 14.3 % (ref 11.0–15.0)
WBC: 4.5 10*3/uL (ref 3.8–10.8)

## 2016-05-23 LAB — COMPREHENSIVE METABOLIC PANEL
ALT: 9 U/L (ref 9–46)
AST: 17 U/L (ref 10–35)
Albumin: 4 g/dL (ref 3.6–5.1)
Alkaline Phosphatase: 57 U/L (ref 40–115)
BUN: 14 mg/dL (ref 7–25)
CHLORIDE: 103 mmol/L (ref 98–110)
CO2: 27 mmol/L (ref 20–31)
CREATININE: 1.27 mg/dL — AB (ref 0.70–1.25)
Calcium: 9 mg/dL (ref 8.6–10.3)
Glucose, Bld: 100 mg/dL — ABNORMAL HIGH (ref 65–99)
POTASSIUM: 4.2 mmol/L (ref 3.5–5.3)
SODIUM: 136 mmol/L (ref 135–146)
TOTAL PROTEIN: 7.1 g/dL (ref 6.1–8.1)
Total Bilirubin: 0.6 mg/dL (ref 0.2–1.2)

## 2016-05-23 LAB — LIPID PANEL
CHOL/HDL RATIO: 1.8 ratio (ref <5.0)
Cholesterol: 195 mg/dL (ref <200)
HDL: 110 mg/dL (ref >40)
LDL CALC: 77 mg/dL (ref <100)
TRIGLYCERIDES: 38 mg/dL (ref <150)
VLDL: 8 mg/dL (ref <30)

## 2016-05-23 MED ORDER — AMLODIPINE BESYLATE 5 MG PO TABS: 5.0000 mg | ORAL_TABLET | Freq: Every day | ORAL | 5 refills | Status: DC

## 2016-05-23 MED ORDER — CHLORTHALIDONE 25 MG PO TABS: ORAL_TABLET | ORAL | 2 refills | Status: DC
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
## 2016-05-23 NOTE — Patient Instructions (Signed)
Medication Instructions:  START AMLODIPINE 5 MG DAILY  START CHLORTHALIDONE 6.25 MG DAILY    START ASPIRIN 81 MG DAILY   Labwork: LP/CMET/CBC/PT/INR AT [facility] ON THE FIRST FLOOR  Testing/Procedures: Your physician has requested that you have a cardiac catheterization. Cardiac catheterization is used to diagnose and/or treat various heart conditions. Doctors may recommend this procedure for a number of different reasons. The most common reason is to evaluate chest pain. Chest pain can be a symptom of coronary artery disease (CAD), and cardiac catheterization can show whether plaque is narrowing or blocking your heart's arteries. This procedure is also used to evaluate the valves, as well as measure the blood flow and oxygen levels in different parts of your heart. For further information please visit [email or web address]. Please follow instruction sheet, as given.  Follow-Up: Your physician recommends that you schedule a follow-up appointment in: 1 MONTH OV  Any Other Special Instructions Will Be Listed Below (If Applicable).  You are scheduled for a cardiac catheterization on 05/30/16 with Dr. Angelena Form or associate.  Go to [facility] [place] on 05/30/16 at 8:00 AM.  Enter thru the [facility] entrance A No food or drink after midnight NIGHT BEFORE. You may take your medications with a sip of water on the day of your procedure.

## 2016-05-24 LAB — PROTIME-INR
INR: 1.1
PROTHROMBIN TIME: 11.3 s (ref 9.0–11.5)

## 2016-05-25 ENCOUNTER — Ambulatory Visit (HOSPITAL_COMMUNITY)
Admission: RE | Admit: 2016-05-25 | Discharge: 2016-05-25 | Disposition: A | Discharge status: Home / Self Care | Payer: Medicare Other | Source: Ambulatory Visit | Attending: Hematology | Admitting: Hematology

## 2016-05-25 ENCOUNTER — Encounter (HOSPITAL_COMMUNITY): Payer: Self-pay

## 2016-05-25 ENCOUNTER — Other Ambulatory Visit (HOSPITAL_BASED_OUTPATIENT_CLINIC_OR_DEPARTMENT_OTHER): Payer: Medicare Other

## 2016-05-25 ENCOUNTER — Telehealth: Payer: Self-pay | Admitting: Hematology

## 2016-05-25 DIAGNOSIS — C182 Malignant neoplasm of ascending colon: Secondary | ICD-10-CM

## 2016-05-25 DIAGNOSIS — C189 Malignant neoplasm of colon, unspecified: Secondary | ICD-10-CM | POA: Diagnosis not present

## 2016-05-25 DIAGNOSIS — J439 Emphysema, unspecified: Secondary | ICD-10-CM | POA: Diagnosis not present

## 2016-05-25 DIAGNOSIS — I2510 Atherosclerotic heart disease of native coronary artery without angina pectoris: Secondary | ICD-10-CM | POA: Insufficient documentation

## 2016-05-25 DIAGNOSIS — Z9889 Other specified postprocedural states: Secondary | ICD-10-CM | POA: Diagnosis not present

## 2016-05-25 LAB — CBC WITH DIFFERENTIAL/PLATELET
BASO%: 0.5 % (ref 0.0–2.0)
Basophils Absolute: 0 10*3/uL (ref 0.0–0.1)
EOS%: 2.4 % (ref 0.0–7.0)
Eosinophils Absolute: 0.1 10*3/uL (ref 0.0–0.5)
HCT: 42.8 % (ref 38.4–49.9)
HEMOGLOBIN: 14.5 g/dL (ref 13.0–17.1)
LYMPH%: 26.4 % (ref 14.0–49.0)
MCH: 30.3 pg (ref 27.2–33.4)
MCHC: 33.8 g/dL (ref 32.0–36.0)
MCV: 89.9 fL (ref 79.3–98.0)
MONO#: 0.4 10*3/uL (ref 0.1–0.9)
MONO%: 8.6 % (ref 0.0–14.0)
NEUT#: 2.9 10*3/uL (ref 1.5–6.5)
NEUT%: 62.1 % (ref 39.0–75.0)
PLATELETS: 182 10*3/uL (ref 140–400)
RBC: 4.77 10*6/uL (ref 4.20–5.82)
RDW: 14.1 % (ref 11.0–14.6)
WBC: 4.7 10*3/uL (ref 4.0–10.3)
lymph#: 1.2 10*3/uL (ref 0.9–3.3)

## 2016-05-25 LAB — COMPREHENSIVE METABOLIC PANEL
ALBUMIN: 3.7 g/dL (ref 3.5–5.0)
ALT: 14 U/L (ref 0–55)
AST: 25 U/L (ref 5–34)
Alkaline Phosphatase: 65 U/L (ref 40–150)
Anion Gap: 9 mEq/L (ref 3–11)
BILIRUBIN TOTAL: 0.39 mg/dL (ref 0.20–1.20)
BUN: 8.2 mg/dL (ref 7.0–26.0)
CO2: 26 meq/L (ref 22–29)
CREATININE: 1 mg/dL (ref 0.7–1.3)
Calcium: 9.2 mg/dL (ref 8.4–10.4)
Chloride: 102 mEq/L (ref 98–109)
EGFR: >90 mL/min/{1.73_m2} (ref >90)
GLUCOSE: 116 mg/dL (ref 70–140)
Potassium: 3.9 mEq/L (ref 3.5–5.1)
SODIUM: 136 meq/L (ref 136–145)
TOTAL PROTEIN: 7.4 g/dL (ref 6.4–8.3)

## 2016-05-25 LAB — CEA (IN HOUSE-CHCC): CEA (CHCC-In House): <1 ng/mL (ref 0.00–5.00)

## 2016-05-25 MED ORDER — IOPAMIDOL (ISOVUE-300) INJECTION 61%
100.0000 mL | Freq: Once | INTRAVENOUS | Status: AC | PRN
  Administered 2016-05-25: 100 mL via INTRAVENOUS

## 2016-05-25 MED ORDER — IOPAMIDOL (ISOVUE-300) INJECTION 61%
INTRAVENOUS | Status: AC
  Filled 2016-05-25: qty 100

## 2016-05-25 NOTE — Telephone Encounter (Signed)
Appointment rescheduled per patient request. 05/25/16

## 2016-05-26 LAB — CEA: CEA1: 1.7 ng/mL (ref 0.0–4.7)

## 2016-05-28 ENCOUNTER — Other Ambulatory Visit: Payer: Self-pay | Admitting: *Deleted

## 2016-05-28 DIAGNOSIS — R9439 Abnormal result of other cardiovascular function study: Secondary | ICD-10-CM

## 2016-05-29 ENCOUNTER — Telehealth: Payer: Self-pay | Admitting: *Deleted

## 2016-05-29 NOTE — Telephone Encounter (Signed)
Telephone call to patient about possible Participation in the [employer] study. I spoke to patients daughter about possible sending patient some information (via e-mail) to review prior to coming to hospital about the Research study. Daughter states that we can talk with patient tomorrow when he arrives to hospital, prior to heart cath. I thanked her and said someone would talk with him in the morning

## 2016-05-30 ENCOUNTER — Encounter (HOSPITAL_COMMUNITY): Payer: Self-pay | Admitting: *Deleted

## 2016-05-30 ENCOUNTER — Encounter (HOSPITAL_COMMUNITY)
Admission: AD | Disposition: A | Discharge status: Home / Self Care | Payer: Self-pay | Source: Ambulatory Visit | Attending: Cardiothoracic Surgery

## 2016-05-30 ENCOUNTER — Inpatient Hospital Stay (HOSPITAL_COMMUNITY)
Admission: AD | Admit: 2016-05-30 | Discharge: 2016-06-07 | DRG: 234 | Disposition: A | Discharge status: Home Health | Payer: Medicare Other | Source: Ambulatory Visit | Attending: Cardiothoracic Surgery | Admitting: Cardiothoracic Surgery

## 2016-05-30 ENCOUNTER — Ambulatory Visit: Payer: Medicare Other | Admitting: Hematology

## 2016-05-30 ENCOUNTER — Other Ambulatory Visit: Payer: Self-pay | Admitting: *Deleted

## 2016-05-30 ENCOUNTER — Ambulatory Visit (HOSPITAL_BASED_OUTPATIENT_CLINIC_OR_DEPARTMENT_OTHER): Payer: Medicare Other

## 2016-05-30 DIAGNOSIS — F17210 Nicotine dependence, cigarettes, uncomplicated: Secondary | ICD-10-CM | POA: Diagnosis present

## 2016-05-30 DIAGNOSIS — Z836 Family history of other diseases of the respiratory system: Secondary | ICD-10-CM | POA: Diagnosis not present

## 2016-05-30 DIAGNOSIS — Z8041 Family history of malignant neoplasm of ovary: Secondary | ICD-10-CM

## 2016-05-30 DIAGNOSIS — R1110 Vomiting, unspecified: Secondary | ICD-10-CM | POA: Diagnosis not present

## 2016-05-30 DIAGNOSIS — J9811 Atelectasis: Secondary | ICD-10-CM | POA: Diagnosis not present

## 2016-05-30 DIAGNOSIS — E876 Hypokalemia: Secondary | ICD-10-CM | POA: Diagnosis not present

## 2016-05-30 DIAGNOSIS — I2510 Atherosclerotic heart disease of native coronary artery without angina pectoris: Secondary | ICD-10-CM | POA: Diagnosis not present

## 2016-05-30 DIAGNOSIS — Z681 Body mass index (BMI) 19 or less, adult: Secondary | ICD-10-CM

## 2016-05-30 DIAGNOSIS — D62 Acute posthemorrhagic anemia: Secondary | ICD-10-CM | POA: Diagnosis not present

## 2016-05-30 DIAGNOSIS — Z419 Encounter for procedure for purposes other than remedying health state, unspecified: Secondary | ICD-10-CM

## 2016-05-30 DIAGNOSIS — Z9689 Presence of other specified functional implants: Secondary | ICD-10-CM

## 2016-05-30 DIAGNOSIS — R42 Dizziness and giddiness: Secondary | ICD-10-CM | POA: Diagnosis not present

## 2016-05-30 DIAGNOSIS — Z972 Presence of dental prosthetic device (complete) (partial): Secondary | ICD-10-CM

## 2016-05-30 DIAGNOSIS — I10 Essential (primary) hypertension: Secondary | ICD-10-CM | POA: Diagnosis present

## 2016-05-30 DIAGNOSIS — Z7982 Long term (current) use of aspirin: Secondary | ICD-10-CM

## 2016-05-30 DIAGNOSIS — Z8249 Family history of ischemic heart disease and other diseases of the circulatory system: Secondary | ICD-10-CM | POA: Diagnosis not present

## 2016-05-30 DIAGNOSIS — I25110 Atherosclerotic heart disease of native coronary artery with unstable angina pectoris: Secondary | ICD-10-CM | POA: Diagnosis not present

## 2016-05-30 DIAGNOSIS — R9439 Abnormal result of other cardiovascular function study: Secondary | ICD-10-CM | POA: Diagnosis not present

## 2016-05-30 DIAGNOSIS — Z801 Family history of malignant neoplasm of trachea, bronchus and lung: Secondary | ICD-10-CM | POA: Diagnosis not present

## 2016-05-30 DIAGNOSIS — R9431 Abnormal electrocardiogram [ECG] [EKG]: Secondary | ICD-10-CM | POA: Diagnosis not present

## 2016-05-30 DIAGNOSIS — Z800 Family history of malignant neoplasm of digestive organs: Secondary | ICD-10-CM | POA: Diagnosis not present

## 2016-05-30 DIAGNOSIS — Z85038 Personal history of other malignant neoplasm of large intestine: Secondary | ICD-10-CM

## 2016-05-30 DIAGNOSIS — J449 Chronic obstructive pulmonary disease, unspecified: Secondary | ICD-10-CM | POA: Diagnosis not present

## 2016-05-30 DIAGNOSIS — E440 Moderate protein-calorie malnutrition: Secondary | ICD-10-CM | POA: Insufficient documentation

## 2016-05-30 DIAGNOSIS — Z09 Encounter for follow-up examination after completed treatment for conditions other than malignant neoplasm: Secondary | ICD-10-CM

## 2016-05-30 DIAGNOSIS — Z951 Presence of aortocoronary bypass graft: Secondary | ICD-10-CM

## 2016-05-30 DIAGNOSIS — Z79899 Other long term (current) drug therapy: Secondary | ICD-10-CM | POA: Diagnosis not present

## 2016-05-30 DIAGNOSIS — I4892 Unspecified atrial flutter: Secondary | ICD-10-CM | POA: Diagnosis not present

## 2016-05-30 DIAGNOSIS — R001 Bradycardia, unspecified: Secondary | ICD-10-CM | POA: Diagnosis not present

## 2016-05-30 DIAGNOSIS — Z01810 Encounter for preprocedural cardiovascular examination: Secondary | ICD-10-CM | POA: Diagnosis not present

## 2016-05-30 HISTORY — PX: LEFT HEART CATH AND CORONARY ANGIOGRAPHY: CATH118249

## 2016-05-30 LAB — ECHOCARDIOGRAM COMPLETE
Height: 76 in
Weight: 2720 oz

## 2016-05-30 SURGERY — LEFT HEART CATH AND CORONARY ANGIOGRAPHY
Anesthesia: LOCAL

## 2016-05-30 MED ORDER — ASPIRIN 81 MG PO CHEW
81.0000 mg | CHEWABLE_TABLET | ORAL | Status: AC
Start: 2016-05-30 — End: 2016-05-30
  Administered 2016-05-30: 81 mg via ORAL

## 2016-05-30 MED ORDER — FENTANYL CITRATE (PF) 100 MCG/2ML IJ SOLN
INTRAMUSCULAR | Status: DC | PRN
  Administered 2016-05-30: 50 ug via INTRAVENOUS

## 2016-05-30 MED ORDER — SODIUM CHLORIDE 0.9% FLUSH: 3.0000 mL | INTRAVENOUS | Status: DC | PRN

## 2016-05-30 MED ORDER — SODIUM CHLORIDE 0.9% FLUSH
3.0000 mL | Freq: Two times a day (BID) | INTRAVENOUS | Status: DC
  Administered 2016-05-30 – 2016-05-31 (×3): 3 mL via INTRAVENOUS

## 2016-05-30 MED ORDER — MIDAZOLAM HCL 2 MG/2ML IJ SOLN
INTRAMUSCULAR | Status: AC
  Filled 2016-05-30: qty 2

## 2016-05-30 MED ORDER — IOPAMIDOL (ISOVUE-370) INJECTION 76%
INTRAVENOUS | Status: AC
  Filled 2016-05-30: qty 100

## 2016-05-30 MED ORDER — ASPIRIN EC 81 MG PO TBEC
81.0000 mg | DELAYED_RELEASE_TABLET | Freq: Every day | ORAL | Status: DC
  Administered 2016-05-31: 81 mg via ORAL
  Filled 2016-05-30: qty 1

## 2016-05-30 MED ORDER — HEPARIN SODIUM (PORCINE) 1000 UNIT/ML IJ SOLN
INTRAMUSCULAR | Status: DC | PRN
  Administered 2016-05-30: 4000 [IU] via INTRAVENOUS

## 2016-05-30 MED ORDER — AMLODIPINE BESYLATE 5 MG PO TABS
5.0000 mg | ORAL_TABLET | Freq: Every day | ORAL | Status: DC
  Administered 2016-05-30 – 2016-05-31 (×2): 5 mg via ORAL
  Filled 2016-05-30 (×2): qty 1

## 2016-05-30 MED ORDER — LIDOCAINE HCL (PF) 1 % IJ SOLN
INTRAMUSCULAR | Status: AC
  Filled 2016-05-30: qty 30

## 2016-05-30 MED ORDER — VERAPAMIL HCL 2.5 MG/ML IV SOLN
INTRAVENOUS | Status: AC
  Filled 2016-05-30: qty 2

## 2016-05-30 MED ORDER — SODIUM CHLORIDE 0.9 % WEIGHT BASED INFUSION: 1.0000 mL/kg/h | INTRAVENOUS | Status: DC

## 2016-05-30 MED ORDER — HEPARIN (PORCINE) IN NACL 2-0.9 UNIT/ML-% IJ SOLN
INTRAMUSCULAR | Status: DC | PRN
  Administered 2016-05-30: 1000 mL

## 2016-05-30 MED ORDER — VERAPAMIL HCL 2.5 MG/ML IV SOLN
INTRAVENOUS | Status: DC | PRN
  Administered 2016-05-30: 10 mL via INTRA_ARTERIAL

## 2016-05-30 MED ORDER — SODIUM CHLORIDE 0.9 % WEIGHT BASED INFUSION
3.0000 mL/kg/h | INTRAVENOUS | Status: DC
  Administered 2016-05-30: 3 mL/kg/h via INTRAVENOUS

## 2016-05-30 MED ORDER — ONDANSETRON HCL 4 MG/2ML IJ SOLN: 4.0000 mg | Freq: Four times a day (QID) | INTRAMUSCULAR | Status: DC | PRN

## 2016-05-30 MED ORDER — SODIUM CHLORIDE 0.9 % IV SOLN
250.0000 mL | INTRAVENOUS | Status: DC | PRN
Start: 2016-05-30 — End: 2016-06-01
  Administered 2016-06-01: 14:00:00 via INTRAVENOUS

## 2016-05-30 MED ORDER — SODIUM CHLORIDE 0.9 % IV SOLN
INTRAVENOUS | Status: AC
  Administered 2016-05-30: 17:00:00 via INTRAVENOUS

## 2016-05-30 MED ORDER — IOPAMIDOL (ISOVUE-370) INJECTION 76%
INTRAVENOUS | Status: DC | PRN
  Administered 2016-05-30: 70 mL via INTRA_ARTERIAL

## 2016-05-30 MED ORDER — ATORVASTATIN CALCIUM 80 MG PO TABS
80.0000 mg | ORAL_TABLET | Freq: Every day | ORAL | Status: DC
  Administered 2016-05-30 – 2016-06-06 (×8): 80 mg via ORAL
  Filled 2016-05-30 (×9): qty 1

## 2016-05-30 MED ORDER — FENTANYL CITRATE (PF) 100 MCG/2ML IJ SOLN
INTRAMUSCULAR | Status: AC
  Filled 2016-05-30: qty 2

## 2016-05-30 MED ORDER — ASPIRIN 81 MG PO CHEW
CHEWABLE_TABLET | ORAL | Status: AC
  Filled 2016-05-30: qty 1

## 2016-05-30 MED ORDER — ACETAMINOPHEN 325 MG PO TABS
650.0000 mg | ORAL_TABLET | ORAL | Status: DC | PRN
Start: 2016-05-30 — End: 2016-06-01

## 2016-05-30 MED ORDER — LIDOCAINE HCL (PF) 1 % IJ SOLN
INTRAMUSCULAR | Status: DC | PRN
  Administered 2016-05-30: 2 mL via SUBCUTANEOUS

## 2016-05-30 MED ORDER — MIDAZOLAM HCL 2 MG/2ML IJ SOLN
INTRAMUSCULAR | Status: DC | PRN
  Administered 2016-05-30: 2 mg via INTRAVENOUS

## 2016-05-30 MED ORDER — HEPARIN (PORCINE) IN NACL 2-0.9 UNIT/ML-% IJ SOLN
INTRAMUSCULAR | Status: AC
  Filled 2016-05-30: qty 1000

## 2016-05-30 MED ORDER — HEPARIN SODIUM (PORCINE) 1000 UNIT/ML IJ SOLN
INTRAMUSCULAR | Status: AC
  Filled 2016-05-30: qty 1

## 2016-05-30 MED ORDER — METOPROLOL TARTRATE 12.5 MG HALF TABLET
25.0000 mg | ORAL_TABLET | Freq: Two times a day (BID) | ORAL | Status: DC
Start: 2016-05-30 — End: 2016-05-31
  Administered 2016-05-30 – 2016-05-31 (×3): 25 mg via ORAL
  Filled 2016-05-30 (×3): qty 2

## 2016-05-30 SURGICAL SUPPLY — 10 items
CATH 5FR JL3.5 JR4 ANG PIG MP (CATHETERS) ×1 IMPLANT
DEVICE RAD COMP TR BAND LRG (VASCULAR PRODUCTS) ×1 IMPLANT
GLIDESHEATH SLEND SS 6F .021 (SHEATH) ×1 IMPLANT
GUIDEWIRE INQWIRE 1.5J.035X260 (WIRE) IMPLANT
INQWIRE 1.5J .035X260CM (WIRE) ×2
KIT HEART LEFT (KITS) ×2 IMPLANT
PACK CARDIAC CATHETERIZATION (CUSTOM PROCEDURE TRAY) ×2 IMPLANT
SYR MEDRAD MARK V 150ML (SYRINGE) ×2 IMPLANT
TRANSDUCER W/STOPCOCK (MISCELLANEOUS) ×3 IMPLANT
TUBING CIL FLEX 10 FLL-RA (TUBING) ×2 IMPLANT
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
## 2016-05-30 NOTE — Interval H&P Note (Signed)
History and Physical Interval Note:  05/30/2016 11:03 AM  Christian Black  has presented today for cardiac cath with the diagnosis of abnormal stress test  The various methods of treatment have been discussed with the patient and family. After consideration of risks, benefits and other options for treatment, the patient has consented to  Procedure(s): Left Heart Cath and Coronary Angiography (N/A) as a surgical intervention .  The patient's history has been reviewed, patient examined, no change in status, stable for surgery.  I have reviewed the patient's chart and labs.  Questions were answered to the patient's satisfaction.    Cath Lab Visit (complete for each Cath Lab visit)  Clinical Evaluation Leading to the Procedure:   ACS: No.  Non-ACS:    Anginal Classification: No Symptoms  Anti-ischemic medical therapy: Minimal Therapy (1 class of medications)  Non-Invasive Test Results: Intermediate-risk stress test findings: cardiac mortality 1-3%/year  Prior CABG: No previous CABG        Lauree Chandler

## 2016-05-30 NOTE — Progress Notes (Signed)
  Echocardiogram 2D Echocardiogram has been performed.  Darlina Sicilian M 05/30/2016, 3:01 PM

## 2016-05-31 ENCOUNTER — Inpatient Hospital Stay (HOSPITAL_COMMUNITY): Payer: Medicare Other

## 2016-05-31 ENCOUNTER — Ambulatory Visit (HOSPITAL_COMMUNITY): Payer: Medicare Other

## 2016-05-31 DIAGNOSIS — I25810 Atherosclerosis of coronary artery bypass graft(s) without angina pectoris: Secondary | ICD-10-CM | POA: Diagnosis not present

## 2016-05-31 DIAGNOSIS — R918 Other nonspecific abnormal finding of lung field: Secondary | ICD-10-CM | POA: Diagnosis not present

## 2016-05-31 DIAGNOSIS — Z01810 Encounter for preprocedural cardiovascular examination: Secondary | ICD-10-CM | POA: Diagnosis not present

## 2016-05-31 DIAGNOSIS — R9439 Abnormal result of other cardiovascular function study: Secondary | ICD-10-CM

## 2016-05-31 DIAGNOSIS — Z800 Family history of malignant neoplasm of digestive organs: Secondary | ICD-10-CM | POA: Diagnosis not present

## 2016-05-31 DIAGNOSIS — Z4682 Encounter for fitting and adjustment of non-vascular catheter: Secondary | ICD-10-CM | POA: Diagnosis not present

## 2016-05-31 DIAGNOSIS — J449 Chronic obstructive pulmonary disease, unspecified: Secondary | ICD-10-CM | POA: Diagnosis not present

## 2016-05-31 DIAGNOSIS — R001 Bradycardia, unspecified: Secondary | ICD-10-CM | POA: Diagnosis present

## 2016-05-31 DIAGNOSIS — I361 Nonrheumatic tricuspid (valve) insufficiency: Secondary | ICD-10-CM | POA: Diagnosis not present

## 2016-05-31 DIAGNOSIS — I4892 Unspecified atrial flutter: Secondary | ICD-10-CM | POA: Diagnosis not present

## 2016-05-31 DIAGNOSIS — Z972 Presence of dental prosthetic device (complete) (partial): Secondary | ICD-10-CM | POA: Diagnosis not present

## 2016-05-31 DIAGNOSIS — J9811 Atelectasis: Secondary | ICD-10-CM | POA: Diagnosis not present

## 2016-05-31 DIAGNOSIS — R42 Dizziness and giddiness: Secondary | ICD-10-CM | POA: Diagnosis not present

## 2016-05-31 DIAGNOSIS — Z79899 Other long term (current) drug therapy: Secondary | ICD-10-CM | POA: Diagnosis not present

## 2016-05-31 DIAGNOSIS — R9431 Abnormal electrocardiogram [ECG] [EKG]: Secondary | ICD-10-CM | POA: Diagnosis present

## 2016-05-31 DIAGNOSIS — Z85038 Personal history of other malignant neoplasm of large intestine: Secondary | ICD-10-CM | POA: Diagnosis not present

## 2016-05-31 DIAGNOSIS — Z8249 Family history of ischemic heart disease and other diseases of the circulatory system: Secondary | ICD-10-CM | POA: Diagnosis not present

## 2016-05-31 DIAGNOSIS — Z7982 Long term (current) use of aspirin: Secondary | ICD-10-CM | POA: Diagnosis not present

## 2016-05-31 DIAGNOSIS — F17210 Nicotine dependence, cigarettes, uncomplicated: Secondary | ICD-10-CM | POA: Diagnosis present

## 2016-05-31 DIAGNOSIS — I10 Essential (primary) hypertension: Secondary | ICD-10-CM | POA: Diagnosis not present

## 2016-05-31 DIAGNOSIS — E876 Hypokalemia: Secondary | ICD-10-CM | POA: Diagnosis not present

## 2016-05-31 DIAGNOSIS — D62 Acute posthemorrhagic anemia: Secondary | ICD-10-CM | POA: Diagnosis not present

## 2016-05-31 DIAGNOSIS — E440 Moderate protein-calorie malnutrition: Secondary | ICD-10-CM | POA: Diagnosis not present

## 2016-05-31 DIAGNOSIS — Z681 Body mass index (BMI) 19 or less, adult: Secondary | ICD-10-CM | POA: Diagnosis not present

## 2016-05-31 DIAGNOSIS — J90 Pleural effusion, not elsewhere classified: Secondary | ICD-10-CM | POA: Diagnosis not present

## 2016-05-31 DIAGNOSIS — Z836 Family history of other diseases of the respiratory system: Secondary | ICD-10-CM | POA: Diagnosis not present

## 2016-05-31 DIAGNOSIS — I2510 Atherosclerotic heart disease of native coronary artery without angina pectoris: Secondary | ICD-10-CM | POA: Diagnosis present

## 2016-05-31 DIAGNOSIS — R0600 Dyspnea, unspecified: Secondary | ICD-10-CM | POA: Diagnosis not present

## 2016-05-31 DIAGNOSIS — Z8041 Family history of malignant neoplasm of ovary: Secondary | ICD-10-CM | POA: Diagnosis not present

## 2016-05-31 DIAGNOSIS — I25110 Atherosclerotic heart disease of native coronary artery with unstable angina pectoris: Secondary | ICD-10-CM | POA: Diagnosis not present

## 2016-05-31 DIAGNOSIS — Z801 Family history of malignant neoplasm of trachea, bronchus and lung: Secondary | ICD-10-CM | POA: Diagnosis not present

## 2016-05-31 DIAGNOSIS — R1110 Vomiting, unspecified: Secondary | ICD-10-CM | POA: Diagnosis not present

## 2016-05-31 LAB — URINALYSIS, MICROSCOPIC (REFLEX)
Bacteria, UA: NONE SEEN
RBC / HPF: NONE SEEN RBC/hpf (ref 0–5)
Squamous Epithelial / LPF: NONE SEEN

## 2016-05-31 LAB — VAS US DOPPLER PRE CABG
LEFT ECA DIAS: -10 cm/s
LEFT VERTEBRAL DIAS: -9 cm/s
Left CCA prox dias: 14 cm/s
Left CCA prox sys: 119 cm/s
Left ICA dist dias: -20 cm/s
Left ICA dist sys: -100 cm/s
Left ICA prox dias: -10 cm/s
Left ICA prox sys: -88 cm/s
RIGHT ECA DIAS: -9 cm/s
RIGHT VERTEBRAL DIAS: -6 cm/s
Right CCA prox dias: 14 cm/s
Right CCA prox sys: 109 cm/s
Right cca dist sys: -85 cm/s

## 2016-05-31 LAB — PULMONARY FUNCTION TEST
FEF 25-75 Pre: 0.96 L/sec
FEF2575-%Pred-Pre: 30 %
FEV1-%Change-Post: -41 %
FEV1-%Pred-Post: 39 %
FEV1-%Pred-Pre: 68 %
FEV1-Post: 1.47 L
FEV1-Pre: 2.52 L
FEV1FVC-%Change-Post: -20 %
FEV1FVC-%Pred-Pre: 86 %
FEV6-%Change-Post: -26 %
FEV6-%Pred-Post: 59 %
FEV6-%Pred-Pre: 80 %
FEV6-Post: 2.75 L
FEV6-Pre: 3.74 L
FEV6FVC-%Change-Post: 0 %
FEV6FVC-%Pred-Post: 104 %
FEV6FVC-%Pred-Pre: 103 %
FVC-%Change-Post: -26 %
FVC-%Pred-Post: 57 %
FVC-%Pred-Pre: 78 %
FVC-Post: 2.75 L
FVC-Pre: 3.76 L
Post FEV1/FVC ratio: 53 %
Post FEV6/FVC ratio: 100 %
Pre FEV1/FVC ratio: 67 %
Pre FEV6/FVC Ratio: 100 %

## 2016-05-31 LAB — CBC
HEMATOCRIT: 43.9 % (ref 39.0–52.0)
HEMOGLOBIN: 14.6 g/dL (ref 13.0–17.0)
MCH: 29.8 pg (ref 26.0–34.0)
MCHC: 33.3 g/dL (ref 30.0–36.0)
MCV: 89.6 fL (ref 78.0–100.0)
Platelets: 207 10*3/uL (ref 150–400)
RBC: 4.9 MIL/uL (ref 4.22–5.81)
RDW: 13.8 % (ref 11.5–15.5)
WBC: 5 10*3/uL (ref 4.0–10.5)

## 2016-05-31 LAB — BASIC METABOLIC PANEL
ANION GAP: 5 (ref 5–15)
BUN: 14 mg/dL (ref 6–20)
CALCIUM: 8.8 mg/dL — AB (ref 8.9–10.3)
CO2: 27 mmol/L (ref 22–32)
Chloride: 106 mmol/L (ref 101–111)
Creatinine, Ser: 1.09 mg/dL (ref 0.61–1.24)
GFR calc non Af Amer: >60 mL/min (ref >60)
Glucose, Bld: 93 mg/dL (ref 65–99)
POTASSIUM: 3.8 mmol/L (ref 3.5–5.1)
Sodium: 138 mmol/L (ref 135–145)

## 2016-05-31 LAB — TYPE AND SCREEN
ABO/RH(D): O POS
Antibody Screen: NEGATIVE

## 2016-05-31 LAB — URINALYSIS, ROUTINE W REFLEX MICROSCOPIC
Bilirubin Urine: NEGATIVE
Glucose, UA: NEGATIVE mg/dL
Hgb urine dipstick: NEGATIVE
Ketones, ur: NEGATIVE mg/dL
Nitrite: NEGATIVE
Protein, ur: NEGATIVE mg/dL
Specific Gravity, Urine: 1.015 (ref 1.005–1.030)
pH: 7 (ref 5.0–8.0)

## 2016-05-31 LAB — BLOOD GAS, ARTERIAL
Acid-Base Excess: 3.3 mmol/L — ABNORMAL HIGH (ref 0.0–2.0)
Bicarbonate: 27.5 mmol/L (ref 20.0–28.0)
Drawn by: 227661
FIO2: 0.21
O2 Saturation: 95.2 %
Patient temperature: 98.6
pCO2 arterial: 43.4 mmHg (ref 32.0–48.0)
pH, Arterial: 7.418 (ref 7.350–7.450)
pO2, Arterial: 79.5 mmHg — ABNORMAL LOW (ref 83.0–108.0)

## 2016-05-31 LAB — PROTIME-INR
INR: 1.05
Prothrombin Time: 13.7 seconds (ref 11.4–15.2)

## 2016-05-31 LAB — ABO/RH: ABO/RH(D): O POS

## 2016-05-31 LAB — APTT: aPTT: 51 seconds — ABNORMAL HIGH (ref 24–36)

## 2016-05-31 MED ORDER — DOPAMINE-DEXTROSE 3.2-5 MG/ML-% IV SOLN
0.0000 ug/kg/min | INTRAVENOUS | Status: DC
  Filled 2016-05-31: qty 250

## 2016-05-31 MED ORDER — TRANEXAMIC ACID (OHS) BOLUS VIA INFUSION
15.0000 mg/kg | INTRAVENOUS | Status: DC
  Administered 2016-06-01: 1156.5 mg via INTRAVENOUS
  Filled 2016-05-31: qty 1157

## 2016-05-31 MED ORDER — CHLORHEXIDINE GLUCONATE 0.12 % MT SOLN
15.0000 mL | Freq: Once | OROMUCOSAL | Status: AC
  Administered 2016-06-01: 15 mL via OROMUCOSAL
  Filled 2016-05-31: qty 15

## 2016-05-31 MED ORDER — TEMAZEPAM 15 MG PO CAPS: 15.0000 mg | ORAL_CAPSULE | Freq: Once | ORAL | Status: DC | PRN

## 2016-05-31 MED ORDER — METOPROLOL TARTRATE 12.5 MG HALF TABLET
12.5000 mg | ORAL_TABLET | Freq: Once | ORAL | Status: DC
  Filled 2016-05-31: qty 1

## 2016-05-31 MED ORDER — NITROGLYCERIN IN D5W 200-5 MCG/ML-% IV SOLN
2.0000 ug/min | INTRAVENOUS | Status: DC
Start: 2016-06-01 — End: 2016-06-01
  Administered 2016-06-01: 5 ug/min via INTRAVENOUS
  Filled 2016-05-31: qty 250

## 2016-05-31 MED ORDER — METOPROLOL TARTRATE 12.5 MG HALF TABLET
12.5000 mg | ORAL_TABLET | Freq: Two times a day (BID) | ORAL | Status: DC
Start: 2016-05-31 — End: 2016-06-01
  Administered 2016-05-31: 12.5 mg via ORAL
  Filled 2016-05-31: qty 1

## 2016-05-31 MED ORDER — TRANEXAMIC ACID 1000 MG/10ML IV SOLN
1.5000 mg/kg/h | INTRAVENOUS | Status: DC
  Administered 2016-06-01: 1.5 mg/kg/h via INTRAVENOUS
  Filled 2016-05-31 (×2): qty 25

## 2016-05-31 MED ORDER — BISACODYL 5 MG PO TBEC
5.0000 mg | DELAYED_RELEASE_TABLET | Freq: Once | ORAL | Status: AC
  Administered 2016-05-31: 5 mg via ORAL
  Filled 2016-05-31: qty 1

## 2016-05-31 MED ORDER — DEXTROSE 5 % IV SOLN
750.0000 mg | INTRAVENOUS | Status: DC
  Filled 2016-05-31 (×2): qty 750

## 2016-05-31 MED ORDER — CHLORHEXIDINE GLUCONATE 4 % EX LIQD
60.0000 mL | Freq: Once | CUTANEOUS | Status: AC
  Administered 2016-05-31: 4 via TOPICAL
  Filled 2016-05-31 (×3): qty 60

## 2016-05-31 MED ORDER — MAGNESIUM SULFATE 50 % IJ SOLN
40.0000 meq | INTRAMUSCULAR | Status: DC
  Filled 2016-05-31 (×2): qty 10

## 2016-05-31 MED ORDER — SODIUM CHLORIDE 0.9 % IV SOLN
30.0000 ug/min | INTRAVENOUS | Status: DC
  Administered 2016-06-01: 10 ug/min via INTRAVENOUS
  Filled 2016-05-31 (×2): qty 2

## 2016-05-31 MED ORDER — POTASSIUM CHLORIDE 2 MEQ/ML IV SOLN
80.0000 meq | INTRAVENOUS | Status: DC
  Filled 2016-05-31 (×2): qty 40

## 2016-05-31 MED ORDER — EPINEPHRINE PF 1 MG/ML IJ SOLN
0.0000 ug/min | INTRAMUSCULAR | Status: DC
Start: 2016-06-01 — End: 2016-06-01
  Filled 2016-05-31 (×2): qty 4

## 2016-05-31 MED ORDER — VANCOMYCIN HCL 10 G IV SOLR
1250.0000 mg | INTRAVENOUS | Status: DC
  Administered 2016-06-01: 1250 mg via INTRAVENOUS
  Filled 2016-05-31 (×2): qty 1250

## 2016-05-31 MED ORDER — SODIUM CHLORIDE 0.9 % IV SOLN
INTRAVENOUS | Status: DC
  Filled 2016-05-31 (×2): qty 30

## 2016-05-31 MED ORDER — DEXMEDETOMIDINE HCL IN NACL 400 MCG/100ML IV SOLN
0.1000 ug/kg/h | INTRAVENOUS | Status: DC
  Administered 2016-06-01: .3 ug/kg/h via INTRAVENOUS
  Filled 2016-05-31 (×2): qty 100

## 2016-05-31 MED ORDER — ALBUTEROL SULFATE (2.5 MG/3ML) 0.083% IN NEBU
2.5000 mg | INHALATION_SOLUTION | Freq: Once | RESPIRATORY_TRACT | Status: AC
  Administered 2016-05-31: 2.5 mg via RESPIRATORY_TRACT

## 2016-05-31 MED ORDER — PAPAVERINE HCL 30 MG/ML IJ SOLN
INTRAMUSCULAR | Status: AC
  Administered 2016-06-01: 500 mL
  Filled 2016-05-31 (×2): qty 2.5

## 2016-05-31 MED ORDER — CHLORHEXIDINE GLUCONATE 4 % EX LIQD
60.0000 mL | Freq: Once | CUTANEOUS | Status: AC
  Administered 2016-06-01: 4 via TOPICAL

## 2016-05-31 MED ORDER — TRANEXAMIC ACID (OHS) PUMP PRIME SOLUTION
2.0000 mg/kg | INTRAVENOUS | Status: DC
  Filled 2016-05-31 (×2): qty 1.54

## 2016-05-31 MED ORDER — SODIUM CHLORIDE 0.9 % IV SOLN
INTRAVENOUS | Status: DC
  Administered 2016-06-01: 1 [IU]/h via INTRAVENOUS
  Filled 2016-05-31 (×2): qty 2.5

## 2016-05-31 MED ORDER — DEXTROSE 5 % IV SOLN
1.5000 g | INTRAVENOUS | Status: DC
  Administered 2016-06-01: .75 g via INTRAVENOUS
  Administered 2016-06-01: 1.5 g via INTRAVENOUS
  Filled 2016-05-31 (×2): qty 1.5

## 2016-05-31 NOTE — Progress Notes (Signed)
Progress Note  Patient Name: Christian Black Date of Encounter: 05/31/2016  Primary Cardiologist: Skeet Latch, MD    Subjective   Feeling well. No chest pain, sob or palpitations. For CABG tomorrow.   Inpatient Medications    Scheduled Meds: . amLODipine  5 mg Oral Daily  . aspirin EC  81 mg Oral Daily  . atorvastatin  80 mg Oral q1800  . [START ON 06/01/2016] cefUROXime (ZINACEF)  IV  1.5 g Intravenous To OR  . [START ON 06/01/2016] cefUROXime (ZINACEF)  IV  750 mg Intravenous To OR  . chlorhexidine  60 mL Topical Once   And  . [START ON 06/01/2016] chlorhexidine  60 mL Topical Once  . [START ON 06/01/2016] chlorhexidine  15 mL Mouth/Throat Once  . [START ON 06/01/2016] dexmedetomidine  0.1-0.7 mcg/kg/hr Intravenous To OR  . [START ON 06/01/2016] DOPamine  0-10 mcg/kg/min Intravenous To OR  . [START ON 06/01/2016] epinephrine  0-10 mcg/min Intravenous To OR  . [START ON 06/01/2016] heparin-papaverine-plasmalyte irrigation   Irrigation To OR  . [START ON 06/01/2016] heparin 30,000 units/NS 1000 mL solution for CELLSAVER   Other To OR  . [START ON 06/01/2016] insulin (NOVOLIN-R) infusion   Intravenous To OR  . [START ON 06/01/2016] magnesium sulfate  40 mEq Other To OR  . [START ON 06/01/2016] metoprolol tartrate  12.5 mg Oral Once  . metoprolol tartrate  25 mg Oral BID  . [START ON 06/01/2016] nitroGLYCERIN  2-200 mcg/min Intravenous To OR  . [START ON 06/01/2016] phenylephrine 20mg /247mL NS (0.08mg /ml) infusion  30-200 mcg/min Intravenous To OR  . [START ON 06/01/2016] potassium chloride  80 mEq Other To OR  . sodium chloride flush  3 mL Intravenous Q12H  . [START ON 06/01/2016] tranexamic acid (CYKLOKAPRON) infusion ([facility])  1.5 mg/kg/hr Intravenous To OR  . [START ON 06/01/2016] tranexamic acid  15 mg/kg Intravenous To OR  . [START ON 06/01/2016] tranexamic acid  2 mg/kg Intracatheter To OR  . [START ON 06/01/2016] vancomycin  1,250 mg Intravenous To OR   Continuous Infusions:  PRN Meds: sodium  chloride, acetaminophen, ondansetron (ZOFRAN) IV, sodium chloride flush, temazepam   Vital Signs    Vitals:   05/30/16 1346 05/30/16 1538 05/30/16 1953 05/31/16 0626  BP: 132/69  123/62 (!) 141/61  Pulse: (!) 51 (!) 55 (!) 51 (!) 49  Resp: 15  18 18   Temp: 97.7 F (36.5 C)  98.6 F (37 C) 98.7 F (37.1 C)  TempSrc: Oral  Oral Oral  SpO2: 100%  99% 100%  Weight:      Height:        Intake/Output Summary (Last 24 hours) at 05/31/16 1049 Last data filed at 05/31/16 0900  Gross per 24 hour  Intake           1427.5 ml  Output             1200 ml  Net            227.5 ml   Filed Weights   05/30/16 0916  Weight: 170 lb (77.1 kg)    Telemetry    Sinus bradycardia in 40-50s - Personally Reviewed  ECG    Sinus brady with non specific ST depression globally 05/03/16 - Personally Reviewed  Physical Exam   GEN: No acute distress.   Neck: No JVD Cardiac: RRR, no murmurs, rubs, or gallops. R radial cath site without hematoma Respiratory: Clear to auscultation bilaterally. GI: Soft, nontender, non-distended  MS: No edema;  No deformity. Neuro:  Nonfocal  Psych: Normal affect   Labs    Chemistry Recent Labs Lab 05/25/16 1002 05/31/16 0257  NA 136 138  K 3.9 3.8  CL  --  106  CO2 26 27  GLUCOSE 116 93  BUN 8.2 14  CREATININE 1.0 1.09  CALCIUM 9.2 8.8*  PROT 7.4  --   ALBUMIN 3.7  --   AST 25  --   ALT 14  --   ALKPHOS 65  --   BILITOT 0.39  --   GFRNONAA  --  >60  GFRAA  --  >60  ANIONGAP 9 5     Hematology Recent Labs Lab 05/25/16 1002 05/31/16 0257  WBC 4.7 5.0  RBC 4.77 4.90  HGB 14.5 14.6  HCT 42.8 43.9  MCV 89.9 89.6  MCH 30.3 29.8  MCHC 33.8 33.3  RDW 14.1 13.8  PLT 182 207    Cardiac EnzymesNo results for input(s): TROPONINI in the last 168 hours. No results for input(s): TROPIPOC in the last 168 hours.   BNPNo results for input(s): BNP, PROBNP in the last 168 hours.   DDimer No results for input(s): DDIMER in the last 168 hours.    Radiology    Dg Chest 2 View  Result Date: 05/31/2016 CLINICAL DATA:  Preoperative examination prior to CABG. History of hypertension, smoking, emphysema, coronary artery disease, colonic malignancy. EXAM: CHEST  2 VIEW COMPARISON:  Chest x-ray of March 19, 2016 and chest CT scan of May 25, 2016. FINDINGS: The lungs are mildly hyperinflated. There is no focal infiltrate. There is no pleural effusion. The heart and pulmonary vascularity are normal. The mediastinum is normal in width. There are old post traumatic rib deformities on the left which are stable. The thoracic spine exhibits no acute abnormality. IMPRESSION: COPD. No pneumonia, CHF, nor other acute cardiopulmonary abnormality. Electronically Signed   By: David  Martinique M.D.   On: 05/31/2016 10:10    Cardiac Studies   Cath 05/20/16 Left Heart Cath and Coronary Angiography  Conclusion     Ost RCA to Mid RCA lesion, 20 %stenosed.  Mid RCA lesion, 70 %stenosed.  Ost Cx to Prox Cx lesion, 80 %stenosed.  Prox Cx lesion, 40 %stenosed.  Ost LM to LM lesion, 80 %stenosed.  Ost LAD to Prox LAD lesion, 80 %stenosed.  2nd Mrg lesion, 50 %stenosed.   1. 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 Heavy calcifiation of the left main, proximal LAD, proximal Circumflex and proximal/mid RAC 2. Severe stenosis distal left main 3. Severe stenosis proximal LAD 4. Severe stenosis proximal Circumflex 5. Severe stenosis mid to distal RCA  Recommendations: Will consult CT surgery for CABG. Will arrange full echo today. Continue ASA. Will start statin and beta blocker.    Echo 05/30/16 LV EF: 55% -   60%  ------------------------------------------------------------------- Indications:      CAD of native vessels 414.01.  ------------------------------------------------------------------- History:   PMH:   Chronic obstructive pulmonary disease.  PMH: Colon Cancer.  Risk factors:  Current tobacco  use.  ------------------------------------------------------------------- Study Conclusions  - Left ventricle: The cavity size was normal. Wall thickness was   normal. Systolic function was normal. The estimated ejection   fraction was in the range of 55% to 60%. Wall motion was normal;   there were no regional wall motion abnormalities. Doppler   parameters are consistent with abnormal left ventricular   relaxation (grade 1 diastolic dysfunction). The E/e&' ratio is <8,   suggesting normal  LV filling pressure. - Left atrium: The atrium was normal in size. - Inferior vena cava: The vessel was normal in size. The   respirophasic diameter changes were in the normal range (>= 50%),   consistent with normal central venous pressure.  Impressions:  - LVEF 55-60%, normal wall thickness, normal wall motion, abnormal   GLPSS at -[id number], diastolic dysfunction, normal LV filling pressure,   normal LA size, normal IVC.   Patient Profile     71 y.o. male with COPD, ongoing tobacco abuse, and prior colon cancer s/p resection recently seen by Dr. Oval Linsey for abnormal EKG. He was referred for a stress echo 05/10/16 that revealed LVEF 60%.  However with stress there was hypokinesis of the apical anterior, mid anteroseptal, apical inferior and apical myocardium. Cath showed severe 3 V disease. For CABG tomorrow.   Assessment & Plan    1. CAD - Cath showed severe 3 V disease. For CABG tomorrow. Chest pain free.   2. Sinus Bradycardia - rate in mid 40s to low 50s. Will reduced metoprolol to 12.64mb BID.   3. Tobacco abuse - Encouraged cessation. Education given.   4. HTN - Relatively stable. Continue norvasc and BB. As above. uptitrate norvasc as above.    Signed, Leanor Kail, PA  05/31/2016, 10:49 AM    History and all data above reviewed.  Patient examined.  I agree with the findings as above.  The patient exam reveals COR:RRR  ,  Lungs: Clear  ,  Abd: Positive bowel sounds, no  rebound no guarding, Ext Right wrist without bruising or bleeding  .  All available labs, radiology testing, previous records reviewed. Agree with documented assessment and plan. CAD:  CABG in the AM.  Bradycardia:  Reduce beta blocker.    Christian Black  12:18 PM  05/31/2016

## 2016-05-31 NOTE — Progress Notes (Signed)
CARDIAC REHAB PHASE I   PRE:  Rate/Rhythm: 44 SB     BP: sitting 135/79    SaO2: 100 RA  MODE:  Ambulation: 220 ft   POST:  Rate/Rhythm: 55 SB    BP: sitting 129/64     SaO2: 100 RA  Pt had not been up much. Slightly weak/unsteady walking to BR. Min assist in hall, no LOB, just seems weak. ? Baseline. Pt denied CP/SOB however stated he was lightheaded toward end of walk. To recliner. Discussed sternal precautions, mobility, d/c planning and IS. Gave him IS and he inspired 1200 mL, correct mechanics. He has been reading [facility] booklet. Gave him [facility] video and careguide as well. Voiced understanding. Apparently has a live-in girlfriend at home. His ex-wife or daughter are his support. Suggest PT c/s post op to help with early strengthening.   [place], ACSM 05/31/2016 11:45 AM

## 2016-05-31 NOTE — Progress Notes (Signed)
Pre-op Cardiac Surgery  Carotid Findings:  Bilateral 1-39% ICA stenosis, CCA intimal thickening, antegrade vertebral flow.   Upper Extremity Right Left  Brachial Pressures 116, Tri 118, Tri  Radial Waveforms Tri Tri  Ulnar Waveforms Tri Tri  Palmar Arch (Allen's Test) waveform increase slightly with radial compression and nearly obliterates with ulnar comrpession waveform increase slightly with radial compression and  obliterates with ulnar comrpession    Lower  Extremity Right Left  Dorsalis Pedis 154 150  Posterior Tibial 159 151  Ankle/Brachial Indices 1.3 1.3    Lita Mains- RDMS, RVT 3:58 PM  05/31/2016

## 2016-05-31 NOTE — Progress Notes (Signed)
Patient ID: Christian Black, male   DOB: March 01, 1946, 71 y.o.   MRN: [id number]      [address]       [place] [address]             [phone]                 1 Day Post-Op Procedure(s) (LRB): Left Heart Cath and Coronary Angiography (N/A)  LOS: 0 days   Subjective: On 2w  , stable no chest pain, alert and talkative , planned procedure again reviewed with patient  Objective: Vital signs in last 24 hours: Patient Vitals for the past 24 hrs:  BP Temp Temp src Pulse Resp SpO2 Height Weight  05/31/16 0626 (!) 141/61 98.7 F (37.1 C) Oral (!) 49 18 100 % - -  05/30/16 1953 123/62 98.6 F (37 C) Oral (!) 51 18 99 % - -  05/30/16 1538 - - - (!) 55 - - - -  05/30/16 1346 132/69 97.7 F (36.5 C) Oral (!) 51 15 100 % - -  05/30/16 1305 (!) 157/76 - - (!) 54 18 98 % - -  05/30/16 1300 (!) 158/68 - - 70 (!) 22 99 % - -  05/30/16 1255 (!) 165/67 - - (!) 57 (!) 29 99 % - -  05/30/16 1250 (!) 161/77 - - (!) 59 15 98 % - -  05/30/16 1245 (!) 159/72 - - (!) 59 (!) 24 100 % - -  05/30/16 1240 (!) 157/69 - - (!) 56 (!) 26 98 % - -  05/30/16 1235 (!) 171/68 - - (!) 54 13 99 % - -  05/30/16 1230 (!) 166/65 - - (!) 110 16 97 % - -  05/30/16 1225 (!) 159/72 - - (!) 102 19 99 % - -  05/30/16 1220 (!) 147/80 - - (!) 55 (!) 26 100 % - -  05/30/16 1215 (!) 166/81 - - (!) 110 (!) 22 99 % - -  05/30/16 1210 (!) 165/80 - - (!) 54 17 100 % - -  05/30/16 1205 (!) 145/76 - - (!) 55 13 99 % - -  05/30/16 1200 (!) 171/68 - - (!) 52 (!) 22 99 % - -  05/30/16 1155 (!) 160/75 - - (!) 55 - 99 % - -  05/30/16 1151 - - - (!) 0 (!) 0 (!) 0 % - -  05/30/16 1146 - - - (!) 0 (!) 0 (!) 0 % - -  05/30/16 1141 (!) 145/81 - - (!) 54 13 100 % - -  05/30/16 1136 (!) 147/74 - - (!) 57 11 100 % - -  05/30/16 1131 (!) 142/78 - - (!) 57 (!) 9 100 % - -  05/30/16 1126 134/77 - - 60 10 100 % - -  05/30/16 1121 133/78 - - (!) 59 12 100 % - -  05/30/16 1117 132/82 - - (!) 58 10 99 % - -  05/30/16 1112  133/81 - - (!) 59 12 99 % - -  05/30/16 1107 (!) 162/82 - - (!) 58 (!) 22 100 % - -  05/30/16 1106 - - - - - 100 % - -  05/30/16 1102 (!) 163/89 - - (!) 58 15 100 % - -  05/30/16 1101 - - - (!) 57 - - - -  05/30/16 0916 (!) 178/74 98.6 F (37 C) Oral (!) 59 16 100 % 6\' 4"  (1.93 m) 170  lb (77.1 kg)    Filed Weights   05/30/16 0916  Weight: 170 lb (77.1 kg)    Hemodynamic parameters for last 24 hours:    Intake/Output from previous day: 02/28 0701 - 03/01 0700 In: 1187.5 [P.O.:840; I.V.:347.5] Out: 800 [Urine:800] Intake/Output this shift: No intake/output data recorded.  Scheduled Meds: . amLODipine  5 mg Oral Daily  . aspirin EC  81 mg Oral Daily  . atorvastatin  80 mg Oral q1800  . metoprolol tartrate  25 mg Oral BID  . sodium chloride flush  3 mL Intravenous Q12H   Continuous Infusions: PRN Meds:.sodium chloride, acetaminophen, ondansetron (ZOFRAN) IV, sodium chloride flush  General appearance: alert and cooperative Neurologic: intact Heart: regular rate and rhythm, S1, S2 normal, no murmur, click, rub or gallop Lungs: clear to auscultation bilaterally Abdomen: soft, non-tender; bowel sounds normal; no masses,  no organomegaly Extremities: extremities normal, atraumatic, no cyanosis or edema Wound: right radial site intact, hand neuro intact  Lab Results: CBC: Recent Labs  05/31/16 0257  WBC 5.0  HGB 14.6  HCT 43.9  PLT 207   BMET:  Recent Labs  05/31/16 0257  NA 138  K 3.8  CL 106  CO2 27  GLUCOSE 93  BUN 14  CREATININE 1.09  CALCIUM 8.8*    PT/INR: No results for input(s): LABPROT, INR in the last 72 hours.   Radiology No results found.   Assessment/Plan: S/P Procedure(s) (LRB): Left Heart Cath and Coronary Angiography (N/A) Mobilize  Plan cabg in am. Patient has had questions answered, he is agreeable with proceeding.  The goals risks and alternatives of the planned surgical procedure CABG  have been discussed with the patient in  detail. The risks of the procedure including death, infection, stroke, myocardial infarction, bleeding, blood transfusion have all been discussed specifically.  I have quoted Fleet Contras a 3 % of perioperative mortality and a complication rate as high as 40 %. The patient's questions have been answered.Christian Black is willing  to proceed with the planned procedure.   [name] MD 05/31/2016 8:15 AM

## 2016-06-01 ENCOUNTER — Encounter (HOSPITAL_COMMUNITY)
Admission: AD | Disposition: A | Discharge status: Home / Self Care | Payer: Self-pay | Source: Ambulatory Visit | Attending: Cardiothoracic Surgery

## 2016-06-01 ENCOUNTER — Ambulatory Visit: Payer: Medicare Other | Admitting: Hematology

## 2016-06-01 ENCOUNTER — Inpatient Hospital Stay (HOSPITAL_COMMUNITY): Payer: Medicare Other | Admitting: Anesthesiology

## 2016-06-01 ENCOUNTER — Inpatient Hospital Stay (HOSPITAL_COMMUNITY): Payer: Medicare Other

## 2016-06-01 ENCOUNTER — Encounter (HOSPITAL_COMMUNITY): Payer: Self-pay | Admitting: Certified Registered Nurse Anesthetist

## 2016-06-01 DIAGNOSIS — I25110 Atherosclerotic heart disease of native coronary artery with unstable angina pectoris: Secondary | ICD-10-CM

## 2016-06-01 DIAGNOSIS — I2510 Atherosclerotic heart disease of native coronary artery without angina pectoris: Secondary | ICD-10-CM | POA: Diagnosis present

## 2016-06-01 HISTORY — PX: TEE WITHOUT CARDIOVERSION: SHX5443

## 2016-06-01 HISTORY — PX: CORONARY ARTERY BYPASS GRAFT: SHX141

## 2016-06-01 LAB — GLUCOSE, CAPILLARY
GLUCOSE-CAPILLARY: 126 mg/dL — AB (ref 65–99)
GLUCOSE-CAPILLARY: 125 mg/dL — AB (ref 65–99)
GLUCOSE-CAPILLARY: 146 mg/dL — AB (ref 65–99)
Glucose-Capillary: 95 mg/dL (ref 65–99)
Glucose-Capillary: 132 mg/dL — ABNORMAL HIGH (ref 65–99)
Glucose-Capillary: 119 mg/dL — ABNORMAL HIGH (ref 65–99)

## 2016-06-01 LAB — POCT I-STAT, CHEM 8
BUN: 13 mg/dL (ref 6–20)
BUN: 19 mg/dL (ref 6–20)
BUN: 17 mg/dL (ref 6–20)
BUN: 15 mg/dL (ref 6–20)
BUN: 9 mg/dL (ref 6–20)
CALCIUM ION: 1.22 mmol/L (ref 1.15–1.40)
CALCIUM ION: 1.02 mmol/L — AB (ref 1.15–1.40)
CREATININE: 0.5 mg/dL — AB (ref 0.61–1.24)
CREATININE: 0.9 mg/dL (ref 0.61–1.24)
CREATININE: 0.8 mg/dL (ref 0.61–1.24)
CREATININE: 0.8 mg/dL (ref 0.61–1.24)
Calcium, Ion: 0.95 mmol/L — ABNORMAL LOW (ref 1.15–1.40)
Calcium, Ion: 1.08 mmol/L — ABNORMAL LOW (ref 1.15–1.40)
Calcium, Ion: 1.17 mmol/L (ref 1.15–1.40)
Chloride: 114 mmol/L — ABNORMAL HIGH (ref 101–111)
Chloride: 104 mmol/L (ref 101–111)
Chloride: 100 mmol/L — ABNORMAL LOW (ref 101–111)
Chloride: 100 mmol/L — ABNORMAL LOW (ref 101–111)
Chloride: 101 mmol/L (ref 101–111)
Creatinine, Ser: 0.9 mg/dL (ref 0.61–1.24)
GLUCOSE: 104 mg/dL — AB (ref 65–99)
Glucose, Bld: 74 mg/dL (ref 65–99)
Glucose, Bld: 172 mg/dL — ABNORMAL HIGH (ref 65–99)
Glucose, Bld: 113 mg/dL — ABNORMAL HIGH (ref 65–99)
Glucose, Bld: 148 mg/dL — ABNORMAL HIGH (ref 65–99)
HCT: 37 % — ABNORMAL LOW (ref 39.0–52.0)
HCT: 28 % — ABNORMAL LOW (ref 39.0–52.0)
HEMATOCRIT: 32 % — AB (ref 39.0–52.0)
HEMATOCRIT: 30 % — AB (ref 39.0–52.0)
HEMATOCRIT: 31 % — AB (ref 39.0–52.0)
HEMOGLOBIN: 10.9 g/dL — AB (ref 13.0–17.0)
HEMOGLOBIN: 12.6 g/dL — AB (ref 13.0–17.0)
HEMOGLOBIN: 10.5 g/dL — AB (ref 13.0–17.0)
Hemoglobin: 10.2 g/dL — ABNORMAL LOW (ref 13.0–17.0)
Hemoglobin: 9.5 g/dL — ABNORMAL LOW (ref 13.0–17.0)
POTASSIUM: 3.8 mmol/L (ref 3.5–5.1)
POTASSIUM: 4.2 mmol/L (ref 3.5–5.1)
POTASSIUM: 3.9 mmol/L (ref 3.5–5.1)
Potassium: 2.5 mmol/L — CL (ref 3.5–5.1)
Potassium: 3.3 mmol/L — ABNORMAL LOW (ref 3.5–5.1)
SODIUM: 141 mmol/L (ref 135–145)
SODIUM: 139 mmol/L (ref 135–145)
Sodium: 147 mmol/L — ABNORMAL HIGH (ref 135–145)
Sodium: 134 mmol/L — ABNORMAL LOW (ref 135–145)
Sodium: 140 mmol/L (ref 135–145)
TCO2: 22 mmol/L (ref 0–100)
TCO2: 31 mmol/L (ref 0–100)
TCO2: 27 mmol/L (ref 0–100)
TCO2: 27 mmol/L (ref 0–100)
TCO2: 27 mmol/L (ref 0–100)

## 2016-06-01 LAB — CBC
HCT: 43.5 % (ref 39.0–52.0)
HCT: 32.7 % — ABNORMAL LOW (ref 39.0–52.0)
HEMATOCRIT: 33.4 % — AB (ref 39.0–52.0)
Hemoglobin: 14.6 g/dL (ref 13.0–17.0)
Hemoglobin: 11 g/dL — ABNORMAL LOW (ref 13.0–17.0)
Hemoglobin: 10.9 g/dL — ABNORMAL LOW (ref 13.0–17.0)
MCH: 30 pg (ref 26.0–34.0)
MCH: 29.3 pg (ref 26.0–34.0)
MCH: 29.6 pg (ref 26.0–34.0)
MCHC: 33.6 g/dL (ref 30.0–36.0)
MCHC: 32.9 g/dL (ref 30.0–36.0)
MCHC: 33.3 g/dL (ref 30.0–36.0)
MCV: 89.3 fL (ref 78.0–100.0)
MCV: 88.8 fL (ref 78.0–100.0)
MCV: 88.9 fL (ref 78.0–100.0)
PLATELETS: 122 10*3/uL — AB (ref 150–400)
Platelets: 202 10*3/uL (ref 150–400)
Platelets: 126 10*3/uL — ABNORMAL LOW (ref 150–400)
RBC: 4.87 MIL/uL (ref 4.22–5.81)
RBC: 3.76 MIL/uL — ABNORMAL LOW (ref 4.22–5.81)
RBC: 3.68 MIL/uL — ABNORMAL LOW (ref 4.22–5.81)
RDW: 13.8 % (ref 11.5–15.5)
RDW: 13.4 % (ref 11.5–15.5)
RDW: 13.4 % (ref 11.5–15.5)
WBC: 4.9 10*3/uL (ref 4.0–10.5)
WBC: 10.3 10*3/uL (ref 4.0–10.5)
WBC: 7 10*3/uL (ref 4.0–10.5)

## 2016-06-01 LAB — POCT I-STAT 3, ART BLOOD GAS (G3+)
ACID-BASE EXCESS: 1 mmol/L (ref 0.0–2.0)
ACID-BASE EXCESS: 3 mmol/L — AB (ref 0.0–2.0)
Acid-Base Excess: 2 mmol/L (ref 0.0–2.0)
Acid-Base Excess: 1 mmol/L (ref 0.0–2.0)
Acid-base deficit: 1 mmol/L (ref 0.0–2.0)
BICARBONATE: 26.4 mmol/L (ref 20.0–28.0)
BICARBONATE: 26.7 mmol/L (ref 20.0–28.0)
BICARBONATE: 28.8 mmol/L — AB (ref 20.0–28.0)
Bicarbonate: 25.2 mmol/L (ref 20.0–28.0)
Bicarbonate: 26.8 mmol/L (ref 20.0–28.0)
O2 SAT: 97 %
O2 SAT: 98 %
O2 Saturation: 100 %
O2 Saturation: 100 %
O2 Saturation: 98 %
PCO2 ART: 47.3 mmHg (ref 32.0–48.0)
PCO2 ART: 49.6 mmHg — AB (ref 32.0–48.0)
PH ART: 7.488 — AB (ref 7.350–7.450)
PH ART: 7.333 — AB (ref 7.350–7.450)
PO2 ART: 337 mmHg — AB (ref 83.0–108.0)
PO2 ART: 381 mmHg — AB (ref 83.0–108.0)
PO2 ART: 121 mmHg — AB (ref 83.0–108.0)
Patient temperature: 33
Patient temperature: 37
Patient temperature: 37.3
TCO2: 28 mmol/L (ref 0–100)
TCO2: 28 mmol/L (ref 0–100)
TCO2: 30 mmol/L (ref 0–100)
TCO2: 27 mmol/L (ref 0–100)
TCO2: 28 mmol/L (ref 0–100)
pCO2 arterial: 33.5 mmHg (ref 32.0–48.0)
pCO2 arterial: 44.9 mmHg (ref 32.0–48.0)
pCO2 arterial: 47.8 mmHg (ref 32.0–48.0)
pH, Arterial: 7.382 (ref 7.350–7.450)
pH, Arterial: 7.384 (ref 7.350–7.450)
pH, Arterial: 7.342 — ABNORMAL LOW (ref 7.350–7.450)
pO2, Arterial: 101 mmHg (ref 83.0–108.0)
pO2, Arterial: 98 mmHg (ref 83.0–108.0)

## 2016-06-01 LAB — CREATININE, SERUM
Creatinine, Ser: 0.87 mg/dL (ref 0.61–1.24)
GFR calc Af Amer: >60 mL/min (ref >60)
GFR calc non Af Amer: >60 mL/min (ref >60)

## 2016-06-01 LAB — APTT: APTT: 42 s — AB (ref 24–36)

## 2016-06-01 LAB — PROTIME-INR
INR: 1.42
Prothrombin Time: 17.5 seconds — ABNORMAL HIGH (ref 11.4–15.2)

## 2016-06-01 LAB — POCT I-STAT 4, (NA,K, GLUC, HGB,HCT)
GLUCOSE: 99 mg/dL (ref 65–99)
HEMATOCRIT: 30 % — AB (ref 39.0–52.0)
Hemoglobin: 10.2 g/dL — ABNORMAL LOW (ref 13.0–17.0)
Potassium: 3.6 mmol/L (ref 3.5–5.1)
SODIUM: 139 mmol/L (ref 135–145)

## 2016-06-01 LAB — BASIC METABOLIC PANEL
Anion gap: 10 (ref 5–15)
BUN: 18 mg/dL (ref 6–20)
CO2: 25 mmol/L (ref 22–32)
Calcium: 9.1 mg/dL (ref 8.9–10.3)
Chloride: 103 mmol/L (ref 101–111)
Creatinine, Ser: 1.14 mg/dL (ref 0.61–1.24)
GFR calc Af Amer: >60 mL/min (ref >60)
GFR calc non Af Amer: >60 mL/min (ref >60)
Glucose, Bld: 84 mg/dL (ref 65–99)
Potassium: 3.8 mmol/L (ref 3.5–5.1)
Sodium: 138 mmol/L (ref 135–145)

## 2016-06-01 LAB — ECHO TEE
Ao-prox: 3.1 cm
STJ: 2.7 cm
TAPSE: 20.3 mm

## 2016-06-01 LAB — SURGICAL PCR SCREEN
MRSA, PCR: NEGATIVE
Staphylococcus aureus: NEGATIVE

## 2016-06-01 LAB — POCT I-STAT 3, VENOUS BLOOD GAS (G3P V)
BICARBONATE: 24.7 mmol/L (ref 20.0–28.0)
O2 Saturation: 90 %
Patient temperature: 33
TCO2: 26 mmol/L (ref 0–100)
pCO2, Ven: 34 mmHg — ABNORMAL LOW (ref 44.0–60.0)
pH, Ven: 7.452 — ABNORMAL HIGH (ref 7.250–7.430)
pO2, Ven: 44 mmHg (ref 32.0–45.0)

## 2016-06-01 LAB — HEMOGLOBIN A1C
Hgb A1c MFr Bld: 5.6 % (ref 4.8–5.6)
Mean Plasma Glucose: 114 mg/dL

## 2016-06-01 LAB — PLATELET COUNT: Platelets: 148 10*3/uL — ABNORMAL LOW (ref 150–400)

## 2016-06-01 LAB — MAGNESIUM: Magnesium: 2.4 mg/dL (ref 1.7–2.4)

## 2016-06-01 LAB — HEMOGLOBIN AND HEMATOCRIT, BLOOD
HCT: 31.4 % — ABNORMAL LOW (ref 39.0–52.0)
Hemoglobin: 10.3 g/dL — ABNORMAL LOW (ref 13.0–17.0)

## 2016-06-01 SURGERY — CORONARY ARTERY BYPASS GRAFTING (CABG)
Anesthesia: General | Site: Chest

## 2016-06-01 MED ORDER — ACETAMINOPHEN 160 MG/5ML PO SOLN
650.0000 mg | Freq: Once | ORAL | Status: AC
  Administered 2016-06-01: 650 mg
  Filled 2016-06-01: qty 20.3

## 2016-06-01 MED ORDER — LACTATED RINGERS IV SOLN
500.0000 mL | Freq: Once | INTRAVENOUS | Status: AC | PRN
  Administered 2016-06-01: 500 mL via INTRAVENOUS

## 2016-06-01 MED ORDER — ASPIRIN 81 MG PO CHEW: 324.0000 mg | CHEWABLE_TABLET | Freq: Every day | ORAL | Status: DC

## 2016-06-01 MED ORDER — CHLORHEXIDINE GLUCONATE CLOTH 2 % EX PADS
6.0000 | MEDICATED_PAD | Freq: Every day | CUTANEOUS | Status: DC
  Administered 2016-06-01 – 2016-06-06 (×5): 6 via TOPICAL

## 2016-06-01 MED ORDER — 0.9 % SODIUM CHLORIDE (POUR BTL) OPTIME
TOPICAL | Status: DC | PRN
  Administered 2016-06-01: 6000 mL

## 2016-06-01 MED ORDER — LACTATED RINGERS IV SOLN
INTRAVENOUS | Status: DC
  Administered 2016-06-01: 15:00:00 via INTRAVENOUS

## 2016-06-01 MED ORDER — SODIUM CHLORIDE 0.9 % IV SOLN: 0.0000 ug/min | INTRAVENOUS | Status: DC

## 2016-06-01 MED ORDER — ALBUTEROL SULFATE HFA 108 (90 BASE) MCG/ACT IN AERS
INHALATION_SPRAY | RESPIRATORY_TRACT | Status: AC
  Filled 2016-06-01: qty 13.4

## 2016-06-01 MED ORDER — LACTATED RINGERS IV SOLN
INTRAVENOUS | Status: DC | PRN
  Administered 2016-06-01: 08:00:00 via INTRAVENOUS

## 2016-06-01 MED ORDER — PROPOFOL 10 MG/ML IV BOLUS
INTRAVENOUS | Status: AC
  Filled 2016-06-01: qty 20

## 2016-06-01 MED ORDER — FENTANYL CITRATE (PF) 250 MCG/5ML IJ SOLN
INTRAMUSCULAR | Status: AC
  Filled 2016-06-01: qty 5

## 2016-06-01 MED ORDER — SODIUM CHLORIDE 0.9 % IV SOLN: INTRAVENOUS | Status: DC

## 2016-06-01 MED ORDER — PROTAMINE SULFATE 10 MG/ML IV SOLN
INTRAVENOUS | Status: DC | PRN
  Administered 2016-06-01: 270 mg via INTRAVENOUS

## 2016-06-01 MED ORDER — ALBUTEROL SULFATE HFA 108 (90 BASE) MCG/ACT IN AERS
INHALATION_SPRAY | RESPIRATORY_TRACT | Status: DC | PRN
  Administered 2016-06-01: 4 via RESPIRATORY_TRACT

## 2016-06-01 MED ORDER — THROMBIN 5000 UNITS EX SOLR
CUTANEOUS | Status: AC
Start: 2016-06-01 — End: 2016-06-01
  Filled 2016-06-01: qty 5000

## 2016-06-01 MED ORDER — HEPARIN SODIUM (PORCINE) 1000 UNIT/ML IJ SOLN
INTRAMUSCULAR | Status: DC | PRN
  Administered 2016-06-01: 26000 [IU] via INTRAVENOUS

## 2016-06-01 MED ORDER — HEPARIN SODIUM (PORCINE) 1000 UNIT/ML IJ SOLN
INTRAMUSCULAR | Status: AC
  Filled 2016-06-01: qty 1

## 2016-06-01 MED ORDER — SODIUM CHLORIDE 0.45 % IV SOLN
INTRAVENOUS | Status: DC | PRN
  Administered 2016-06-01: 15:00:00 via INTRAVENOUS

## 2016-06-01 MED ORDER — ASPIRIN EC 325 MG PO TBEC
325.0000 mg | DELAYED_RELEASE_TABLET | Freq: Every day | ORAL | Status: DC
  Administered 2016-06-02 – 2016-06-04 (×3): 325 mg via ORAL
  Filled 2016-06-01 (×4): qty 1

## 2016-06-01 MED ORDER — ROCURONIUM BROMIDE 50 MG/5ML IV SOSY
PREFILLED_SYRINGE | INTRAVENOUS | Status: AC
  Filled 2016-06-01: qty 30

## 2016-06-01 MED ORDER — FAMOTIDINE IN NACL 20-0.9 MG/50ML-% IV SOLN
20.0000 mg | Freq: Two times a day (BID) | INTRAVENOUS | Status: DC
  Administered 2016-06-02: 20 mg via INTRAVENOUS
  Filled 2016-06-01: qty 50

## 2016-06-01 MED ORDER — MIDAZOLAM HCL 10 MG/2ML IJ SOLN
INTRAMUSCULAR | Status: AC
  Filled 2016-06-01: qty 2

## 2016-06-01 MED ORDER — PHENYLEPHRINE 40 MCG/ML (10ML) SYRINGE FOR IV PUSH (FOR BLOOD PRESSURE SUPPORT)
PREFILLED_SYRINGE | INTRAVENOUS | Status: AC
  Filled 2016-06-01: qty 20

## 2016-06-01 MED ORDER — DOCUSATE SODIUM 100 MG PO CAPS
200.0000 mg | ORAL_CAPSULE | Freq: Every day | ORAL | Status: DC
  Administered 2016-06-02 – 2016-06-04 (×3): 200 mg via ORAL
  Filled 2016-06-01 (×4): qty 2

## 2016-06-01 MED ORDER — ONDANSETRON HCL 4 MG/2ML IJ SOLN
4.0000 mg | Freq: Four times a day (QID) | INTRAMUSCULAR | Status: DC | PRN
  Administered 2016-06-01 – 2016-06-03 (×4): 4 mg via INTRAVENOUS
  Filled 2016-06-01 (×4): qty 2

## 2016-06-01 MED ORDER — CHLORHEXIDINE GLUCONATE 0.12 % MT SOLN
15.0000 mL | OROMUCOSAL | Status: AC
  Administered 2016-06-01: 15 mL via OROMUCOSAL

## 2016-06-01 MED ORDER — INSULIN REGULAR BOLUS VIA INFUSION
0.0000 [IU] | Freq: Three times a day (TID) | INTRAVENOUS | Status: DC
  Filled 2016-06-01: qty 10

## 2016-06-01 MED ORDER — THROMBIN 5000 UNITS EX SOLR
CUTANEOUS | Status: DC | PRN
  Administered 2016-06-01: 5000 [IU] via TOPICAL

## 2016-06-01 MED ORDER — ROCURONIUM BROMIDE 10 MG/ML (PF) SYRINGE
PREFILLED_SYRINGE | INTRAVENOUS | Status: DC | PRN
  Administered 2016-06-01 (×5): 50 mg via INTRAVENOUS

## 2016-06-01 MED ORDER — DOPAMINE-DEXTROSE 3.2-5 MG/ML-% IV SOLN
0.0000 ug/kg/min | INTRAVENOUS | Status: DC
  Filled 2016-06-01: qty 250

## 2016-06-01 MED ORDER — EPHEDRINE 5 MG/ML INJ
INTRAVENOUS | Status: AC
  Filled 2016-06-01: qty 20

## 2016-06-01 MED ORDER — MAGNESIUM SULFATE 4 GM/100ML IV SOLN
4.0000 g | Freq: Once | INTRAVENOUS | Status: AC
  Administered 2016-06-01: 4 g via INTRAVENOUS
  Filled 2016-06-01: qty 100

## 2016-06-01 MED ORDER — SODIUM CHLORIDE 0.9% FLUSH
3.0000 mL | INTRAVENOUS | Status: DC | PRN
  Administered 2016-06-03: 3 mL via INTRAVENOUS
  Filled 2016-06-01: qty 3

## 2016-06-01 MED ORDER — SUCCINYLCHOLINE CHLORIDE 200 MG/10ML IV SOSY
PREFILLED_SYRINGE | INTRAVENOUS | Status: AC
  Filled 2016-06-01: qty 10

## 2016-06-01 MED ORDER — MORPHINE SULFATE (PF) 2 MG/ML IV SOLN
1.0000 mg | INTRAVENOUS | Status: DC | PRN
  Administered 2016-06-01 (×2): 2 mg via INTRAVENOUS
  Filled 2016-06-01: qty 1

## 2016-06-01 MED ORDER — CALCIUM CHLORIDE 10 % IV SOLN
INTRAVENOUS | Status: DC | PRN
  Administered 2016-06-01 (×2): 200 mg via INTRAVENOUS

## 2016-06-01 MED ORDER — ACETAMINOPHEN 650 MG RE SUPP: 650.0000 mg | Freq: Once | RECTAL | Status: AC

## 2016-06-01 MED ORDER — MIDAZOLAM HCL 2 MG/2ML IJ SOLN: 2.0000 mg | INTRAMUSCULAR | Status: AC | PRN

## 2016-06-01 MED ORDER — DEXTROSE 5 % IV SOLN
1.5000 g | Freq: Two times a day (BID) | INTRAVENOUS | Status: AC
  Administered 2016-06-01 – 2016-06-03 (×4): 1.5 g via INTRAVENOUS
  Filled 2016-06-01 (×4): qty 1.5

## 2016-06-01 MED ORDER — MIDAZOLAM HCL 5 MG/5ML IJ SOLN
INTRAMUSCULAR | Status: DC | PRN
  Administered 2016-06-01: 4 mg via INTRAVENOUS
  Administered 2016-06-01: 1 mg via INTRAVENOUS
  Administered 2016-06-01 (×2): 2 mg via INTRAVENOUS
  Administered 2016-06-01: 1 mg via INTRAVENOUS

## 2016-06-01 MED ORDER — POTASSIUM CHLORIDE 2 MEQ/ML IV SOLN
30.0000 meq | Freq: Once | INTRAVENOUS | Status: AC
  Administered 2016-06-01: 30 meq via INTRAVENOUS
  Filled 2016-06-01: qty 15

## 2016-06-01 MED ORDER — ALBUMIN HUMAN 5 % IV SOLN
250.0000 mL | INTRAVENOUS | Status: AC | PRN
  Administered 2016-06-01 (×2): 250 mL via INTRAVENOUS
  Filled 2016-06-01: qty 250

## 2016-06-01 MED ORDER — DOPAMINE-DEXTROSE 3.2-5 MG/ML-% IV SOLN
3.0000 ug/kg/min | INTRAVENOUS | Status: DC
  Filled 2016-06-01: qty 250

## 2016-06-01 MED ORDER — METOPROLOL TARTRATE 5 MG/5ML IV SOLN
2.5000 mg | INTRAVENOUS | Status: DC | PRN
  Administered 2016-06-01: 2.5 mg via INTRAVENOUS
  Administered 2016-06-04 (×2): 5 mg via INTRAVENOUS
  Filled 2016-06-01 (×2): qty 5

## 2016-06-01 MED ORDER — VANCOMYCIN HCL IN DEXTROSE 1-5 GM/200ML-% IV SOLN
1000.0000 mg | Freq: Once | INTRAVENOUS | Status: AC
  Administered 2016-06-01: 1000 mg via INTRAVENOUS
  Filled 2016-06-01: qty 200

## 2016-06-01 MED ORDER — FENTANYL CITRATE (PF) 250 MCG/5ML IJ SOLN
INTRAMUSCULAR | Status: DC | PRN
  Administered 2016-06-01: 100 ug via INTRAVENOUS
  Administered 2016-06-01 (×2): 250 ug via INTRAVENOUS
  Administered 2016-06-01: 150 ug via INTRAVENOUS
  Administered 2016-06-01: 100 ug via INTRAVENOUS
  Administered 2016-06-01: 150 ug via INTRAVENOUS
  Administered 2016-06-01 (×2): 100 ug via INTRAVENOUS
  Administered 2016-06-01: 50 ug via INTRAVENOUS

## 2016-06-01 MED ORDER — BISACODYL 10 MG RE SUPP: 10.0000 mg | Freq: Every day | RECTAL | Status: DC

## 2016-06-01 MED ORDER — METOPROLOL TARTRATE 12.5 MG HALF TABLET
12.5000 mg | ORAL_TABLET | Freq: Two times a day (BID) | ORAL | Status: DC
  Administered 2016-06-02: 12.5 mg via ORAL
  Filled 2016-06-01: qty 1

## 2016-06-01 MED ORDER — SODIUM CHLORIDE 0.9% FLUSH
10.0000 mL | Freq: Two times a day (BID) | INTRAVENOUS | Status: DC
  Administered 2016-06-01 – 2016-06-04 (×6): 10 mL

## 2016-06-01 MED ORDER — CHLORHEXIDINE GLUCONATE 0.12% ORAL RINSE (MEDLINE KIT): 15.0000 mL | Freq: Two times a day (BID) | OROMUCOSAL | Status: DC

## 2016-06-01 MED ORDER — SODIUM CHLORIDE 0.9 % IV SOLN
INTRAVENOUS | Status: DC
  Administered 2016-06-01 – 2016-06-05 (×2): via INTRAVENOUS

## 2016-06-01 MED ORDER — ACETAMINOPHEN 500 MG PO TABS
1000.0000 mg | ORAL_TABLET | Freq: Four times a day (QID) | ORAL | Status: DC
  Administered 2016-06-02 – 2016-06-05 (×11): 1000 mg via ORAL
  Filled 2016-06-01 (×11): qty 2

## 2016-06-01 MED ORDER — OXYCODONE HCL 5 MG PO TABS
5.0000 mg | ORAL_TABLET | ORAL | Status: DC | PRN
  Administered 2016-06-03: 10 mg via ORAL
  Filled 2016-06-01 (×2): qty 2

## 2016-06-01 MED ORDER — SODIUM CHLORIDE 0.9% FLUSH
10.0000 mL | INTRAVENOUS | Status: DC | PRN
  Administered 2016-06-04: 20 mL
  Filled 2016-06-01: qty 40

## 2016-06-01 MED ORDER — LEVALBUTEROL HCL 0.63 MG/3ML IN NEBU
0.6300 mg | INHALATION_SOLUTION | Freq: Four times a day (QID) | RESPIRATORY_TRACT | Status: DC
  Administered 2016-06-01 – 2016-06-02 (×5): 0.63 mg via RESPIRATORY_TRACT
  Filled 2016-06-01 (×7): qty 3

## 2016-06-01 MED ORDER — BISACODYL 5 MG PO TBEC
10.0000 mg | DELAYED_RELEASE_TABLET | Freq: Every day | ORAL | Status: DC
  Administered 2016-06-02 – 2016-06-04 (×3): 10 mg via ORAL
  Filled 2016-06-01 (×4): qty 2

## 2016-06-01 MED ORDER — DEXMEDETOMIDINE HCL IN NACL 200 MCG/50ML IV SOLN
0.0000 ug/kg/h | INTRAVENOUS | Status: DC
  Filled 2016-06-01: qty 50

## 2016-06-01 MED ORDER — SODIUM CHLORIDE 0.9 % IV SOLN: 250.0000 mL | INTRAVENOUS | Status: DC

## 2016-06-01 MED ORDER — SODIUM CHLORIDE 0.9 % IJ SOLN
OROMUCOSAL | Status: DC | PRN
  Administered 2016-06-01: 4 mL via TOPICAL

## 2016-06-01 MED ORDER — HEMOSTATIC AGENTS (NO CHARGE) OPTIME
TOPICAL | Status: DC | PRN
  Administered 2016-06-01: 1 via TOPICAL

## 2016-06-01 MED ORDER — LACTATED RINGERS IV SOLN
INTRAVENOUS | Status: DC | PRN
  Administered 2016-06-01 (×2): via INTRAVENOUS

## 2016-06-01 MED ORDER — METOPROLOL TARTRATE 25 MG/10 ML ORAL SUSPENSION
12.5000 mg | Freq: Two times a day (BID) | ORAL | Status: DC
  Administered 2016-06-01: 12.5 mg
  Filled 2016-06-01: qty 5

## 2016-06-01 MED ORDER — THROMBIN 5000 UNITS EX SOLR
CUTANEOUS | Status: AC
  Filled 2016-06-01: qty 5000

## 2016-06-01 MED ORDER — ACETAMINOPHEN 160 MG/5ML PO SOLN: 1000.0000 mg | Freq: Four times a day (QID) | ORAL | Status: DC

## 2016-06-01 MED ORDER — PROPOFOL 10 MG/ML IV BOLUS
INTRAVENOUS | Status: DC | PRN
  Administered 2016-06-01: 50 mg via INTRAVENOUS

## 2016-06-01 MED ORDER — LIDOCAINE 2% (20 MG/ML) 5 ML SYRINGE
INTRAMUSCULAR | Status: AC
  Filled 2016-06-01: qty 5

## 2016-06-01 MED ORDER — ORAL CARE MOUTH RINSE
15.0000 mL | Freq: Four times a day (QID) | OROMUCOSAL | Status: DC
  Administered 2016-06-02 – 2016-06-04 (×10): 15 mL via OROMUCOSAL

## 2016-06-01 MED ORDER — TRAMADOL HCL 50 MG PO TABS: 50.0000 mg | ORAL_TABLET | ORAL | Status: DC | PRN

## 2016-06-01 MED ORDER — SODIUM CHLORIDE 0.9% FLUSH
3.0000 mL | Freq: Two times a day (BID) | INTRAVENOUS | Status: DC
  Administered 2016-06-02 – 2016-06-04 (×3): 3 mL via INTRAVENOUS

## 2016-06-01 MED ORDER — NITROGLYCERIN IN D5W 200-5 MCG/ML-% IV SOLN: 0.0000 ug/min | INTRAVENOUS | Status: DC

## 2016-06-01 MED ORDER — MORPHINE SULFATE (PF) 2 MG/ML IV SOLN
2.0000 mg | INTRAVENOUS | Status: DC | PRN
  Administered 2016-06-02 (×4): 2 mg via INTRAVENOUS
  Filled 2016-06-01 (×5): qty 1

## 2016-06-01 MED ORDER — DOPAMINE-DEXTROSE 3.2-5 MG/ML-% IV SOLN
INTRAVENOUS | Status: DC | PRN
  Administered 2016-06-01: 3 ug/kg/min via INTRAVENOUS

## 2016-06-01 MED ORDER — PANTOPRAZOLE SODIUM 40 MG PO TBEC
40.0000 mg | DELAYED_RELEASE_TABLET | Freq: Every day | ORAL | Status: DC
  Administered 2016-06-03 – 2016-06-04 (×2): 40 mg via ORAL
  Filled 2016-06-01 (×3): qty 1

## 2016-06-01 MED FILL — Heparin Sodium (Porcine) Inj 1000 Unit/ML: INTRAMUSCULAR | Qty: 30 | Status: AC

## 2016-06-01 MED FILL — Magnesium Sulfate Inj 50%: INTRAMUSCULAR | Qty: 10 | Status: AC

## 2016-06-01 MED FILL — Potassium Chloride Inj 2 mEq/ML: INTRAVENOUS | Qty: 40 | Status: AC

## 2016-06-01 SURGICAL SUPPLY — 96 items
ADH SKN CLS APL DERMABOND .7 (GAUZE/BANDAGES/DRESSINGS) ×4
AGENT HMST MTR 8 SURGIFLO (HEMOSTASIS) ×2
BAG DECANTER FOR FLEXI CONT (MISCELLANEOUS) ×3 IMPLANT
BANDAGE ACE 4X5 VEL STRL LF (GAUZE/BANDAGES/DRESSINGS) ×3 IMPLANT
BANDAGE ACE 6X5 VEL STRL LF (GAUZE/BANDAGES/DRESSINGS) ×3 IMPLANT
BLADE STERNUM SYSTEM 6 (BLADE) ×3 IMPLANT
BLADE SURG 11 STRL SS (BLADE) ×1 IMPLANT
BNDG GAUZE ELAST 4 BULKY (GAUZE/BANDAGES/DRESSINGS) ×3 IMPLANT
CANISTER SUCT 3000ML PPV (MISCELLANEOUS) ×3 IMPLANT
CATH CPB KIT GERHARDT (MISCELLANEOUS) ×3 IMPLANT
CATH THORACIC 28FR (CATHETERS) ×3 IMPLANT
CLIP FOGARTY SPRING 6M (CLIP) ×1 IMPLANT
CLIP TI WIDE RED SMALL 24 (CLIP) ×1 IMPLANT
CRADLE DONUT ADULT HEAD (MISCELLANEOUS) ×3 IMPLANT
DERMABOND ADVANCED (GAUZE/BANDAGES/DRESSINGS) ×2
DERMABOND ADVANCED .7 DNX12 (GAUZE/BANDAGES/DRESSINGS) IMPLANT
DRAIN CHANNEL 28F RND 3/8 FF (WOUND CARE) ×3 IMPLANT
DRAPE CARDIOVASCULAR INCISE (DRAPES) ×3
DRAPE SLUSH/WARMER DISC (DRAPES) ×3 IMPLANT
DRAPE SRG 135X102X78XABS (DRAPES) ×2 IMPLANT
DRSG AQUACEL AG ADV 3.5X14 (GAUZE/BANDAGES/DRESSINGS) ×3 IMPLANT
ELECT BLADE 4.0 EZ CLEAN MEGAD (MISCELLANEOUS) ×3
ELECT REM PT RETURN 9FT ADLT (ELECTROSURGICAL) ×6
ELECTRODE BLDE 4.0 EZ CLN MEGD (MISCELLANEOUS) ×2 IMPLANT
ELECTRODE REM PT RTRN 9FT ADLT (ELECTROSURGICAL) ×4 IMPLANT
FELT TEFLON 1X6 (MISCELLANEOUS) ×6 IMPLANT
GAUZE SPONGE 4X4 12PLY STRL (GAUZE/BANDAGES/DRESSINGS) ×6 IMPLANT
GLOVE BIO SURGEON STRL SZ 6.5 (GLOVE) ×16 IMPLANT
GLOVE BIOGEL PI IND STRL 6.5 (GLOVE) IMPLANT
GLOVE BIOGEL PI INDICATOR 6.5 (GLOVE) ×6
GOWN STRL REUS W/ TWL LRG LVL3 (GOWN DISPOSABLE) ×8 IMPLANT
GOWN STRL REUS W/TWL LRG LVL3 (GOWN DISPOSABLE) ×30
HEMOSTAT POWDER SURGIFOAM 1G (HEMOSTASIS) ×9 IMPLANT
HEMOSTAT SURGICEL 2X14 (HEMOSTASIS) ×3 IMPLANT
IV CATH 18G X1.75 CATHLON (IV SOLUTION) ×1 IMPLANT
KIT BASIN OR (CUSTOM PROCEDURE TRAY) ×3 IMPLANT
KIT CATH SUCT 8FR (CATHETERS) ×3 IMPLANT
KIT ROOM TURNOVER OR (KITS) ×3 IMPLANT
KIT SUCTION CATH 14FR (SUCTIONS) ×6 IMPLANT
KIT VASOVIEW HEMOPRO VH 3000 (KITS) ×3 IMPLANT
LEAD PACING MYOCARDI (MISCELLANEOUS) ×3 IMPLANT
MARKER GRAFT CORONARY BYPASS (MISCELLANEOUS) ×9 IMPLANT
NS IRRIG 1000ML POUR BTL (IV SOLUTION) ×16 IMPLANT
PACK OPEN HEART (CUSTOM PROCEDURE TRAY) ×3 IMPLANT
PAD ARMBOARD 7.5X6 YLW CONV (MISCELLANEOUS) ×6 IMPLANT
PAD ELECT DEFIB RADIOL ZOLL (MISCELLANEOUS) ×3 IMPLANT
PENCIL BUTTON HOLSTER BLD 10FT (ELECTRODE) ×3 IMPLANT
PUNCH AORTIC ROT 4.0MM RCL 40 (MISCELLANEOUS) ×1 IMPLANT
SET CARDIOPLEGIA MPS 5001102 (MISCELLANEOUS) ×1 IMPLANT
SPOGE SURGIFLO 8M (HEMOSTASIS) ×1
SPONGE GAUZE 4X4 12PLY STER LF (GAUZE/BANDAGES/DRESSINGS) ×2 IMPLANT
SPONGE LAP 18X18 X RAY DECT (DISPOSABLE) ×3 IMPLANT
SPONGE SURGIFLO 8M (HEMOSTASIS) IMPLANT
SUT BONE WAX W31G (SUTURE) ×3 IMPLANT
SUT ETHIBOND 2 0 SH (SUTURE) ×12
SUT ETHIBOND 2 0 SH 36X2 (SUTURE) IMPLANT
SUT MNCRL AB 4-0 PS2 18 (SUTURE) ×3 IMPLANT
SUT PROLENE 3 0 SH1 36 (SUTURE) ×3 IMPLANT
SUT PROLENE 4 0 RB 1 (SUTURE) ×3
SUT PROLENE 4 0 TF (SUTURE) ×6 IMPLANT
SUT PROLENE 4-0 RB1 .5 CRCL 36 (SUTURE) IMPLANT
SUT PROLENE 5 0 C 1 36 (SUTURE) ×2 IMPLANT
SUT PROLENE 6 0 C 1 30 (SUTURE) ×2 IMPLANT
SUT PROLENE 6 0 CC (SUTURE) ×7 IMPLANT
SUT PROLENE 7 0 BV1 MDA (SUTURE) ×6 IMPLANT
SUT PROLENE 8 0 BV175 6 (SUTURE) ×6 IMPLANT
SUT PROLENE POLY MONO (SUTURE) ×1 IMPLANT
SUT SILK  1 MH (SUTURE) ×3
SUT SILK 1 MH (SUTURE) IMPLANT
SUT SILK 1 TIES 10X30 (SUTURE) ×1 IMPLANT
SUT SILK 2 0 SH CR/8 (SUTURE) ×2 IMPLANT
SUT SILK 2 0 TIES 10X30 (SUTURE) ×1 IMPLANT
SUT SILK 2 0 TIES 17X18 (SUTURE) ×3
SUT SILK 2-0 18XBRD TIE BLK (SUTURE) IMPLANT
SUT SILK 3 0 SH CR/8 (SUTURE) ×1 IMPLANT
SUT SILK 4 0 TIE 10X30 (SUTURE) ×2 IMPLANT
SUT STEEL 6MS V (SUTURE) ×3 IMPLANT
SUT STEEL SZ 6 DBL 3X14 BALL (SUTURE) ×3 IMPLANT
SUT TEM PAC WIRE 2 0 SH (SUTURE) ×4 IMPLANT
SUT VIC AB 1 CTX 18 (SUTURE) ×6 IMPLANT
SUT VIC AB 2-0 CT1 27 (SUTURE) ×9
SUT VIC AB 2-0 CT1 TAPERPNT 27 (SUTURE) IMPLANT
SUT VIC AB 2-0 CTX 27 (SUTURE) ×2 IMPLANT
SUT VIC AB 3-0 X1 27 (SUTURE) ×2 IMPLANT
SUTURE E-PAK OPEN HEART (SUTURE) ×3 IMPLANT
SYSTEM SAHARA CHEST DRAIN ATS (WOUND CARE) ×3 IMPLANT
TAPE CLOTH SURG 4X10 WHT LF (GAUZE/BANDAGES/DRESSINGS) ×1 IMPLANT
TAPE PAPER 2X10 WHT MICROPORE (GAUZE/BANDAGES/DRESSINGS) ×1 IMPLANT
TOWEL GREEN STERILE (TOWEL DISPOSABLE) ×9 IMPLANT
TOWEL GREEN STERILE FF (TOWEL DISPOSABLE) ×4 IMPLANT
TOWEL OR 17X24 6PK STRL BLUE (TOWEL DISPOSABLE) ×4 IMPLANT
TOWEL OR 17X26 10 PK STRL BLUE (TOWEL DISPOSABLE) ×4 IMPLANT
TRAY FOLEY IC TEMP SENS 16FR (CATHETERS) ×3 IMPLANT
TUBING INSUFFLATION (TUBING) ×3 IMPLANT
UNDERPAD 30X30 (UNDERPADS AND DIAPERS) ×3 IMPLANT
WATER STERILE IRR 1000ML POUR (IV SOLUTION) ×6 IMPLANT

## 2016-06-01 NOTE — Progress Notes (Signed)
  Echocardiogram Echocardiogram Transesophageal has been performed.  Darlina Sicilian M 06/01/2016, 8:09 AM

## 2016-06-01 NOTE — Progress Notes (Signed)
CT surgery p.m. Rounds  Patient doing well after multivessel CABG Hemodynamic stable off pressors Then weaned and process-extubation imminent Minimal drainage from chest tubes Lab values satisfactory

## 2016-06-01 NOTE — Anesthesia Procedure Notes (Signed)
Procedure Name: Intubation Date/Time: 06/01/2016 7:48 AM Performed by: Willeen Cass P Pre-anesthesia Checklist: Patient identified, Emergency Drugs available, Suction available and Patient being monitored Patient Re-evaluated:Patient Re-evaluated prior to inductionOxygen Delivery Method: Circle System Utilized Preoxygenation: Pre-oxygenation with 100% oxygen Intubation Type: IV induction Ventilation: Mask ventilation without difficulty Laryngoscope Size: Miller and 2 Grade View: Grade I Tube type: Oral Tube size: 8.0 mm Number of attempts: 1 Airway Equipment and Method: Stylet and Oral airway Placement Confirmation: ETT inserted through vocal cords under direct vision,  positive ETCO2 and breath sounds checked- equal and bilateral Tube secured with: Tape Dental Injury: Teeth and Oropharynx as per pre-operative assessment  Comments: Performed by Ronita Hipps, SRNA

## 2016-06-01 NOTE — Progress Notes (Signed)
Prn Lopressor 2.5mg  given

## 2016-06-01 NOTE — Brief Op Note (Addendum)
      [address]       [place] [address]             [phone]       06/01/2016  12:44 PM  PATIENT:  Christian Black  71 y.o. male  PRE-OPERATIVE DIAGNOSIS:  CAD with left main disease  POST-OPERATIVE DIAGNOSIS:  CAD with left main disease  PROCEDURE: TRANSESOPHAGEAL ECHOCARDIOGRAM (TEE), CORONARY ARTERY BYPASS GRAFTING (CABG) x 5 (LIMA to LAD, SVG to DIAGONAL, SVG SEQUENTIALLY to DISTAL CIRCUMFLEX and OM, SVG to RCA)  using the left internal mammary artery as well as the right greater saphenous vein harvested endoscopically.   FINDINGS: LAD is intramyocardial  SURGEON:  Surgeon(s) and Role:    * [name], MD - Primary  PHYSICIAN ASSISTANT: Lars Pinks PA-C  ANESTHESIA:   general  EBL:  Total I/O In: 1600 [I.V.:1600] Out: 100 [Urine:100]  DRAINS: Chest tubes placed in the mediastinal and left  pleural spaces   COUNTS CORRECT:  YES  DICTATION: .Dragon Dictation  PLAN OF CARE: Admit to inpatient   PATIENT DISPOSITION:  ICU - intubated and hemodynamically stable.   Delay start of Pharmacological VTE agent (>24hrs) due to surgical blood loss or risk of bleeding: yes  BASELINE WEIGHT: 77.1 kg

## 2016-06-01 NOTE — Consult Note (Signed)
           [facility] CM Primary Care Navigator  06/01/2016  Christian Black 12/01/1945 [id number]   Went to see patientat the bedside to identify possible discharge needsbut staff reports that patient was transferred to 2S 16 (ICU) earlier after his surgery(CABG).  Will attempt to meet with patient at another time, when transferred out of ICU.  For questions, please contact:  Dannielle Huh, BSN, RN- [facility] Primary Care Navigator  Telephone: [phone] [place]

## 2016-06-01 NOTE — Anesthesia Preprocedure Evaluation (Signed)
Anesthesia Evaluation  Patient identified by MRN, date of birth, ID band Patient awake    Reviewed: Allergy & Precautions, NPO status , Patient's Chart, lab work & pertinent test results  Airway Mallampati: II  TM Distance: >3 FB Neck ROM: Full    Dental  (+) Poor Dentition, Partial Upper, Partial Lower   Pulmonary Current Smoker,    breath sounds clear to auscultation       Cardiovascular hypertension,  Rhythm:Regular Rate:Normal     Neuro/Psych    GI/Hepatic   Endo/Other    Renal/GU      Musculoskeletal   Abdominal   Peds  Hematology   Anesthesia Other Findings   Reproductive/Obstetrics                             Anesthesia Physical Anesthesia Plan  ASA: III  Anesthesia Plan: General   Post-op Pain Management:    Induction: Intravenous  Airway Management Planned: Oral ETT  Additional Equipment: Arterial line, CVP, PA Cath, Ultrasound Guidance Line Placement and 3D TEE  Intra-op Plan:   Post-operative Plan: Extubation in OR  Informed Consent: I have reviewed the patients History and Physical, chart, labs and discussed the procedure including the risks, benefits and alternatives for the proposed anesthesia with the patient or authorized representative who has indicated his/her understanding and acceptance.   Dental advisory given  Plan Discussed with: CRNA and Anesthesiologist  Anesthesia Plan Comments:         Anesthesia Quick Evaluation

## 2016-06-01 NOTE — Anesthesia Procedure Notes (Signed)
Central Venous Catheter Insertion Performed by: Roberts Gaudy, anesthesiologist Start/End3/05/2016 7:05 AM, 06/01/2016 7:10 AM Patient location: OR. Preanesthetic checklist: patient identified, IV checked, site marked, risks and benefits discussed, surgical consent, monitors and equipment checked, pre-op evaluation and timeout performed Position: supine Lidocaine 1% used for infiltration Hand hygiene performed , maximum sterile barriers used  and Seldinger technique used Catheter size: 8.5 Fr PA cath was placed.Procedure performed using ultrasound guided technique. Ultrasound Notes:anatomy identified and image(s) printed for medical record Attempts: 1 Following insertion, line sutured, dressing applied and Biopatch. Post procedure assessment: blood return through all ports, free fluid flow and no air  Patient tolerated the procedure well with no immediate complications.

## 2016-06-01 NOTE — Anesthesia Postprocedure Evaluation (Addendum)
Anesthesia Post Note  Patient: Christian Black  Procedure(s) Performed: Procedure(s) (LRB): CORONARY ARTERY BYPASS GRAFTING (CABG) x 5 using the left internal mammary artery as well as the right greater saphenous vein harvested endoscopically. LIMA to LAD, SVG to DIAGONAL, SVG SEQUENTIALLY to DISTAL CIRCUMFLEX and OM, SVG to RCA (N/A) TRANSESOPHAGEAL ECHOCARDIOGRAM (TEE) (N/A)  Patient location during evaluation: SICU Anesthesia Type: General Pain management: pain level controlled Vital Signs Assessment: post-procedure vital signs reviewed and stable Respiratory status: patient remains intubated per anesthesia plan and patient on ventilator - see flowsheet for VS Cardiovascular status: blood pressure returned to baseline Anesthetic complications: no       Last Vitals:  Vitals:   06/01/16 1700 06/01/16 1715  BP:    Pulse: 85 87  Resp: 17 13  Temp: 36.4 C 36.4 C    Last Pain:  Vitals:   06/01/16 0526  TempSrc: Oral  PainSc:                  Peta Peachey COKER

## 2016-06-01 NOTE — Transfer of Care (Signed)
Immediate Anesthesia Transfer of Care Note  Patient: Christian Black  Procedure(s) Performed: Procedure(s): CORONARY ARTERY BYPASS GRAFTING (CABG) x 5 using the left internal mammary artery as well as the right greater saphenous vein harvested endoscopically. LIMA to LAD, SVG to DIAGONAL, SVG SEQUENTIALLY to DISTAL CIRCUMFLEX and OM, SVG to RCA (N/A) TRANSESOPHAGEAL ECHOCARDIOGRAM (TEE) (N/A)  Patient Location: SICU  Anesthesia Type:General  Level of Consciousness: sedated and Patient remains intubated per anesthesia plan  Airway & Oxygen Therapy: Patient remains intubated per anesthesia plan and Patient placed on Ventilator (see vital sign flow sheet for setting)  Post-op Assessment: Report given to RN and Post -op Vital signs reviewed and stable  Post vital signs: Reviewed and stable  Last Vitals:  BP 105/51 per Aline PAP 20/9 HR 93 RR 12  -- see vent FS SpO2 100% at 0.5 FiO2 T 36.1 Dr Servando Snare and Dr Linna Caprice at bedside in ICU.  Patients Stated Pain Goal: 5 ([id number])  Complications: No apparent anesthesia complications

## 2016-06-01 NOTE — Care Management Note (Signed)
Case Management Note  Patient Details  Name: NAADIR HOKAMA MRN: [id number] Date of Birth: 13-Feb-1946  Subjective/Objective:    S/p CABG x 5  On 3/2, conts on vent, NCM will cont to follow for dc needs.                Action/Plan:   Expected Discharge Date:                  Expected Discharge Plan:     In-House Referral:     Discharge planning Services  CM Consult  Post Acute Care Choice:    Choice offered to:     DME Arranged:    DME Agency:     HH Arranged:    HH Agency:     Status of Service:  In process, will continue to follow  If discussed at Long Length of Stay Meetings, dates discussed:    Additional Comments:  Zenon Mayo, RN 06/01/2016, 4:47 PM

## 2016-06-01 NOTE — Anesthesia Procedure Notes (Signed)
Central Venous Catheter Insertion Performed by: Roberts Gaudy, anesthesiologist Start/End3/05/2016 7:10 AM, 06/01/2016 7:15 AM Patient location: OR. Preanesthetic checklist: patient identified, IV checked, site marked, risks and benefits discussed, surgical consent, monitors and equipment checked, pre-op evaluation and timeout performed Position: supine Lidocaine 1% used for infiltration Hand hygiene performed , maximum sterile barriers used  and Seldinger technique used Catheter size: 8 Fr Total catheter length 17. Central line was placed.Double lumen Procedure performed using ultrasound guided technique. Ultrasound Notes:anatomy identified, needle tip was noted to be adjacent to the nerve/plexus identified, no ultrasound evidence of intravascular and/or intraneural injection and image(s) printed for medical record Attempts: 1 Following insertion, line sutured, dressing applied and Biopatch. Post procedure assessment: blood return through all ports, free fluid flow and no air  Patient tolerated the procedure well with no immediate complications.

## 2016-06-01 NOTE — Progress Notes (Signed)
Pt extubated per protocol to $L [place]. RT gave the Pt a Xopenex Tx. Directly after extubation. Pt is clear NIF 20, FVC .9L

## 2016-06-01 NOTE — Progress Notes (Signed)
Scheduled Lopressor given

## 2016-06-01 NOTE — Progress Notes (Signed)
Pre Procedure note for inpatients:   Christian Black has been scheduled for Procedure(s): CORONARY ARTERY BYPASS GRAFTING (CABG) (N/A) TRANSESOPHAGEAL ECHOCARDIOGRAM (TEE) (N/A) today. The various methods of treatment have been discussed with the patient. After consideration of the risks, benefits and treatment options the patient has consented to the planned procedure.   The patient has been seen and labs reviewed. There are no changes in the patient's condition to prevent proceeding with the planned procedure today.  Recent labs:  Lab Results  Component Value Date   WBC 4.9 06/01/2016   HGB 14.6 06/01/2016   HCT 43.5 06/01/2016   PLT 202 06/01/2016   GLUCOSE 84 06/01/2016   CHOL 195 05/23/2016   TRIG 38 05/23/2016   HDL 110 05/23/2016   LDLCALC 77 05/23/2016   ALT 14 05/25/2016   AST 25 05/25/2016   NA 138 06/01/2016   K 3.8 06/01/2016   CL 103 06/01/2016   CREATININE 1.14 06/01/2016   BUN 18 06/01/2016   CO2 25 06/01/2016   TSH 1.37 11/01/2015   PSA 0.84 11/01/2015   INR 1.05 05/31/2016   HGBA1C 5.6 05/31/2016    [name], MD 06/01/2016 7:13 AM

## 2016-06-02 ENCOUNTER — Inpatient Hospital Stay (HOSPITAL_COMMUNITY): Payer: Medicare Other

## 2016-06-02 LAB — GLUCOSE, CAPILLARY
GLUCOSE-CAPILLARY: 104 mg/dL — AB (ref 65–99)
GLUCOSE-CAPILLARY: 109 mg/dL — AB (ref 65–99)
Glucose-Capillary: 92 mg/dL (ref 65–99)
Glucose-Capillary: 85 mg/dL (ref 65–99)
Glucose-Capillary: 124 mg/dL — ABNORMAL HIGH (ref 65–99)
Glucose-Capillary: 112 mg/dL — ABNORMAL HIGH (ref 65–99)

## 2016-06-02 LAB — CREATININE, SERUM
Creatinine, Ser: 1.03 mg/dL (ref 0.61–1.24)
GFR calc Af Amer: >60 mL/min (ref >60)
GFR calc non Af Amer: >60 mL/min (ref >60)

## 2016-06-02 LAB — MAGNESIUM
MAGNESIUM: 2 mg/dL (ref 1.7–2.4)
Magnesium: 1.8 mg/dL (ref 1.7–2.4)

## 2016-06-02 LAB — POCT I-STAT, CHEM 8
BUN: 8 mg/dL (ref 6–20)
CALCIUM ION: 1.15 mmol/L (ref 1.15–1.40)
Chloride: 96 mmol/L — ABNORMAL LOW (ref 101–111)
Creatinine, Ser: 1 mg/dL (ref 0.61–1.24)
GLUCOSE: 125 mg/dL — AB (ref 65–99)
HCT: 41 % (ref 39.0–52.0)
HEMOGLOBIN: 13.9 g/dL (ref 13.0–17.0)
POTASSIUM: 4.1 mmol/L (ref 3.5–5.1)
SODIUM: 137 mmol/L (ref 135–145)
TCO2: 31 mmol/L (ref 0–100)

## 2016-06-02 LAB — BASIC METABOLIC PANEL
ANION GAP: 6 (ref 5–15)
BUN: 7 mg/dL (ref 6–20)
CHLORIDE: 103 mmol/L (ref 101–111)
CO2: 28 mmol/L (ref 22–32)
CREATININE: 0.82 mg/dL (ref 0.61–1.24)
Calcium: 8.2 mg/dL — ABNORMAL LOW (ref 8.9–10.3)
GFR calc non Af Amer: >60 mL/min (ref >60)
Glucose, Bld: 102 mg/dL — ABNORMAL HIGH (ref 65–99)
POTASSIUM: 3.9 mmol/L (ref 3.5–5.1)
Sodium: 137 mmol/L (ref 135–145)

## 2016-06-02 LAB — CBC
HEMATOCRIT: 33.1 % — AB (ref 39.0–52.0)
HEMATOCRIT: 37.4 % — AB (ref 39.0–52.0)
HEMOGLOBIN: 10.8 g/dL — AB (ref 13.0–17.0)
Hemoglobin: 12.3 g/dL — ABNORMAL LOW (ref 13.0–17.0)
MCH: 29 pg (ref 26.0–34.0)
MCH: 29.5 pg (ref 26.0–34.0)
MCHC: 32.6 g/dL (ref 30.0–36.0)
MCHC: 32.9 g/dL (ref 30.0–36.0)
MCV: 89 fL (ref 78.0–100.0)
MCV: 89.7 fL (ref 78.0–100.0)
PLATELETS: 150 10*3/uL (ref 150–400)
Platelets: 120 10*3/uL — ABNORMAL LOW (ref 150–400)
RBC: 3.72 MIL/uL — AB (ref 4.22–5.81)
RBC: 4.17 MIL/uL — ABNORMAL LOW (ref 4.22–5.81)
RDW: 13.4 % (ref 11.5–15.5)
RDW: 13.5 % (ref 11.5–15.5)
WBC: 6.2 10*3/uL (ref 4.0–10.5)
WBC: 7.4 10*3/uL (ref 4.0–10.5)

## 2016-06-02 MED ORDER — METOPROLOL TARTRATE 25 MG PO TABS
25.0000 mg | ORAL_TABLET | Freq: Two times a day (BID) | ORAL | Status: DC
  Administered 2016-06-02 – 2016-06-04 (×5): 25 mg via ORAL
  Filled 2016-06-02 (×6): qty 1

## 2016-06-02 MED ORDER — FUROSEMIDE 10 MG/ML IJ SOLN
20.0000 mg | Freq: Two times a day (BID) | INTRAMUSCULAR | Status: DC
  Administered 2016-06-02 – 2016-06-03 (×3): 20 mg via INTRAVENOUS
  Filled 2016-06-02 (×3): qty 2

## 2016-06-02 MED ORDER — INSULIN ASPART 100 UNIT/ML ~~LOC~~ SOLN: 0.0000 [IU] | SUBCUTANEOUS | Status: DC

## 2016-06-02 MED ORDER — INSULIN ASPART 100 UNIT/ML ~~LOC~~ SOLN
0.0000 [IU] | SUBCUTANEOUS | Status: DC
  Administered 2016-06-02: 4 [IU] via SUBCUTANEOUS
  Administered 2016-06-03: 2 [IU] via SUBCUTANEOUS

## 2016-06-02 MED ORDER — LEVALBUTEROL HCL 0.63 MG/3ML IN NEBU
0.6300 mg | INHALATION_SOLUTION | Freq: Three times a day (TID) | RESPIRATORY_TRACT | Status: DC
  Administered 2016-06-03 (×3): 0.63 mg via RESPIRATORY_TRACT
  Filled 2016-06-02 (×4): qty 3

## 2016-06-02 NOTE — Progress Notes (Signed)
1 Day Post-Op Procedure(s) (LRB): CORONARY ARTERY BYPASS GRAFTING (CABG) x 5 using the left internal mammary artery as well as the right greater saphenous vein harvested endoscopically. LIMA to LAD, SVG to DIAGONAL, SVG SEQUENTIALLY to DISTAL CIRCUMFLEX and OM, SVG to RCA (N/A) TRANSESOPHAGEAL ECHOCARDIOGRAM (TEE) (N/A) Subjective: Progressing post CABG  Objective: Vital signs in last 24 hours: Temp:  [97 F (36.1 C)-100.2 F (37.9 C)] 99.5 F (37.5 C) (03/03 1000) Pulse Rate:  [65-94] 79 (03/03 0430) Cardiac Rhythm: Normal sinus rhythm (03/02 2345) Resp:  [11-30] 22 (03/03 1000) BP: (78-155)/(61-87) 138/71 (03/03 0900) SpO2:  [95 %-100 %] 98 % (03/03 0853) Arterial Line BP: (93-184)/(46-76) 158/59 (03/03 0900) FiO2 (%):  [40 %-50 %] 40 % (03/02 1815) Weight:  [167 lb 15.9 oz (76.2 kg)-169 lb 15.6 oz (77.1 kg)] 167 lb 15.9 oz (76.2 kg) (03/03 0430)  Hemodynamic parameters for last 24 hours: PAP: (18-37)/(4-17) 26/9 CO:  [4.6 L/min-6.9 L/min] 4.8 L/min CI:  [2.2 L/min/m2-3.4 L/min/m2] 2.3 L/min/m2  Intake/Output from previous day: 03/02 0701 - 03/03 0700 In: 5266.8 [I.V.:4116.8; Blood:455; NG/GT:30; IV Piggyback:665] Out: 7762 [Urine:4855; Blood:2019; Chest Tube:300] Intake/Output this shift: Total I/O In: 150 [I.V.:100; IV Piggyback:50] Out: 310 [Urine:310]       Exam    General- alert and comfortable   Lungs- clear without rales, wheezes   Cor- regular rate and rhythm, no murmur , gallop   Abdomen- soft, non-tender   Extremities - warm, non-tender, minimal edema   Neuro- oriented, appropriate, no focal weakness   Lab Results:  Recent Labs  06/01/16 2120 06/01/16 2131 06/02/16 0334  WBC 7.0  --  6.2  HGB 10.9* 10.5* 10.8*  HCT 32.7* 31.0* 33.1*  PLT 126*  --  120*   BMET:  Recent Labs  06/01/16 0518  06/01/16 2131 06/02/16 0334  NA 138  < > 139 137  K 3.8  < > 3.9 3.9  CL 103  < > 101 103  CO2 25  --   --  28  GLUCOSE 84  < > 148* 102*  BUN 18  < >  9 7  CREATININE 1.14  < > 0.80 0.82  CALCIUM 9.1  --   --  8.2*  < > = values in this interval not displayed.  PT/INR:  Recent Labs  06/01/16 1515  LABPROT 17.5*  INR 1.42   ABG    Component Value Date/Time   PHART 7.342 (L) 06/01/2016 2137   HCO3 26.8 06/01/2016 2137   TCO2 28 06/01/2016 2137   ACIDBASEDEF 1.0 06/01/2016 1837   O2SAT 98.0 06/01/2016 2137   CBG (last 3)   Recent Labs  06/02/16 0127 06/02/16 0250 06/02/16 0737  GLUCAP 85 104* 109*    Assessment/Plan: S/P Procedure(s) (LRB): CORONARY ARTERY BYPASS GRAFTING (CABG) x 5 using the left internal mammary artery as well as the right greater saphenous vein harvested endoscopically. LIMA to LAD, SVG to DIAGONAL, SVG SEQUENTIALLY to DISTAL CIRCUMFLEX and OM, SVG to RCA (N/A) TRANSESOPHAGEAL ECHOCARDIOGRAM (TEE) (N/A) Mobilize Diuresis d/c tubes/lines See progression orders   LOS: 2 days    Christian Black 06/02/2016

## 2016-06-02 NOTE — Addendum Note (Signed)
Addendum  created 06/02/16 0955 by Roberts Gaudy, MD   Sign clinical note

## 2016-06-02 NOTE — Progress Notes (Signed)
Anesthesiology Follow-up:  Awake and alert, neuro intact, sitting in chair, hemodynamically stable off pressors.  VS: T- 37.7 HR- 72 (SR) RR- 18 O2 Sat 98% on 2L PAP- 24/7 CO/CI: 4.8/2.3  K- 3.9 glucose- 114 BUN/Cr.- 7/0.82 H/H- 10.8/33.1 Platelets- 120,000   Extubated 3 1/2 hours post-op.  71 year old male with diffuse CAD and preserved LV functio, one day S/P CABG X 5. Now doing well with no apparent anesthetic complications.  Christian Black

## 2016-06-02 NOTE — Progress Notes (Signed)
CT surgery p.m. Rounds\  Patient examined and record reviewed.Hemodynamics stable,labs satisfactory.Patient had stable day.Continue current care. Christian Black 06/02/2016

## 2016-06-03 ENCOUNTER — Inpatient Hospital Stay (HOSPITAL_COMMUNITY): Payer: Medicare Other

## 2016-06-03 LAB — GLUCOSE, CAPILLARY
GLUCOSE-CAPILLARY: 97 mg/dL (ref 65–99)
Glucose-Capillary: 100 mg/dL — ABNORMAL HIGH (ref 65–99)
Glucose-Capillary: 127 mg/dL — ABNORMAL HIGH (ref 65–99)
Glucose-Capillary: 128 mg/dL — ABNORMAL HIGH (ref 65–99)

## 2016-06-03 LAB — CBC
HCT: 34 % — ABNORMAL LOW (ref 39.0–52.0)
Hemoglobin: 11.3 g/dL — ABNORMAL LOW (ref 13.0–17.0)
MCH: 29.2 pg (ref 26.0–34.0)
MCHC: 32.6 g/dL (ref 30.0–36.0)
MCV: 89.5 fL (ref 78.0–100.0)
Platelets: 132 10*3/uL — ABNORMAL LOW (ref 150–400)
RBC: 3.8 MIL/uL — ABNORMAL LOW (ref 4.22–5.81)
RDW: 13.5 % (ref 11.5–15.5)
WBC: 7.1 10*3/uL (ref 4.0–10.5)

## 2016-06-03 LAB — BASIC METABOLIC PANEL
Anion gap: 4 — ABNORMAL LOW (ref 5–15)
BUN: 10 mg/dL (ref 6–20)
CO2: 33 mmol/L — ABNORMAL HIGH (ref 22–32)
Calcium: 8.6 mg/dL — ABNORMAL LOW (ref 8.9–10.3)
Chloride: 99 mmol/L — ABNORMAL LOW (ref 101–111)
Creatinine, Ser: 1.03 mg/dL (ref 0.61–1.24)
GFR calc Af Amer: >60 mL/min (ref >60)
GFR calc non Af Amer: >60 mL/min (ref >60)
Glucose, Bld: 93 mg/dL (ref 65–99)
Potassium: 3.7 mmol/L (ref 3.5–5.1)
Sodium: 136 mmol/L (ref 135–145)

## 2016-06-03 MED ORDER — SODIUM CHLORIDE 0.9% FLUSH
3.0000 mL | Freq: Two times a day (BID) | INTRAVENOUS | Status: DC
  Administered 2016-06-03 – 2016-06-04 (×2): 3 mL via INTRAVENOUS
  Administered 2016-06-04: 10 mL via INTRAVENOUS
  Administered 2016-06-04: 3 mL via INTRAVENOUS

## 2016-06-03 MED ORDER — FUROSEMIDE 40 MG PO TABS
40.0000 mg | ORAL_TABLET | Freq: Every day | ORAL | Status: DC
  Filled 2016-06-03: qty 1

## 2016-06-03 MED ORDER — DEXTROSE 5 % IV SOLN
1.0000 g | Freq: Three times a day (TID) | INTRAVENOUS | Status: DC
  Administered 2016-06-03 – 2016-06-05 (×5): 1 g via INTRAVENOUS
  Filled 2016-06-03 (×7): qty 1

## 2016-06-03 MED ORDER — SODIUM CHLORIDE 0.9% FLUSH: 3.0000 mL | INTRAVENOUS | Status: DC | PRN

## 2016-06-03 MED ORDER — METOCLOPRAMIDE HCL 5 MG/ML IJ SOLN
10.0000 mg | Freq: Four times a day (QID) | INTRAMUSCULAR | Status: DC
  Administered 2016-06-03 – 2016-06-05 (×8): 10 mg via INTRAVENOUS
  Filled 2016-06-03 (×8): qty 2

## 2016-06-03 MED ORDER — GUAIFENESIN ER 600 MG PO TB12
600.0000 mg | ORAL_TABLET | Freq: Two times a day (BID) | ORAL | Status: DC
  Administered 2016-06-03 – 2016-06-07 (×9): 600 mg via ORAL
  Filled 2016-06-03 (×9): qty 1

## 2016-06-03 MED ORDER — SODIUM CHLORIDE 0.9 % IV SOLN: 250.0000 mL | INTRAVENOUS | Status: DC | PRN

## 2016-06-03 MED ORDER — MOVING RIGHT ALONG BOOK
Freq: Once | Status: AC
Start: 2016-06-03 — End: 2016-06-03
  Administered 2016-06-03: 11:00:00
  Filled 2016-06-03: qty 1

## 2016-06-03 MED ORDER — SODIUM CHLORIDE 0.9 % IV SOLN
30.0000 meq | Freq: Once | INTRAVENOUS | Status: AC
  Administered 2016-06-03: 30 meq via INTRAVENOUS
  Filled 2016-06-03: qty 15

## 2016-06-03 MED ORDER — MAGNESIUM HYDROXIDE 400 MG/5ML PO SUSP: 30.0000 mL | Freq: Every day | ORAL | Status: DC | PRN

## 2016-06-03 MED ORDER — ACETYLCYSTEINE 20 % IN SOLN
4.0000 mL | Freq: Three times a day (TID) | RESPIRATORY_TRACT | Status: DC
  Administered 2016-06-03: 4 mL via RESPIRATORY_TRACT
  Filled 2016-06-03 (×2): qty 4

## 2016-06-03 NOTE — Progress Notes (Signed)
Attempted to stand pt to ambulate.  Pt got nauseated.  Assisted back to chair.  Vomited approx 100 ml bile emesis.  HR dropped to 30bpm.  TPM immediately attached to pt.  AAI 80.  Rate immediately up to 90bpm after vomiting.  O2 sat 88% 5LNC.  Bilateral lungs diminished throughout.  Dr. Darcey Nora notified of events.  Orders received.  Transfer cancelled.  Pt sitting in chair at bedside with O2 sat 94% 5LNC.  Will continue to closely monitor.

## 2016-06-03 NOTE — Progress Notes (Signed)
CT surgery p.m. Rounds  Patient more fatigued today although remains in sinus rhythm with stable vital signs Wet cough with right lower lobe atelectasis on x-ray Increased pulmonary toilet with minimal effect Patient underwent nasotracheal suctioning with removal of large amounts of secretions sent for culture Patient started on Fortaz Physical therapy ordered

## 2016-06-03 NOTE — Progress Notes (Signed)
2 Days Post-Op Procedure(s) (LRB): CORONARY ARTERY BYPASS GRAFTING (CABG) x 5 using the left internal mammary artery as well as the right greater saphenous vein harvested endoscopically. LIMA to LAD, SVG to DIAGONAL, SVG SEQUENTIALLY to DISTAL CIRCUMFLEX and OM, SVG to RCA (N/A) TRANSESOPHAGEAL ECHOCARDIOGRAM (TEE) (N/A) Subjective: Doing well  nsr RLL atelectasis on am CXR Labs ok, wt at preop tx to stepdown Objective: Vital signs in last 24 hours: Temp:  [97.6 F (36.4 C)-99.5 F (37.5 C)] 97.6 F (36.4 C) (03/04 0919) Pulse Rate:  [66-105] 85 (03/04 0700) Cardiac Rhythm: Normal sinus rhythm (03/04 0100) Resp:  [16-34] 22 (03/04 0700) BP: (96-152)/(59-83) 106/65 (03/04 0700) SpO2:  [90 %-98 %] 96 % (03/04 0801) FiO2 (%):  [2 %] 2 % (03/04 0100) Weight:  [163 lb 12.8 oz (74.3 kg)] 163 lb 12.8 oz (74.3 kg) (03/04 0500)  Hemodynamic parameters for last 24 hours: CVP:  [1 mmHg-6 mmHg] 2 mmHg  Intake/Output from previous day: 03/03 0701 - 03/04 0700 In: 1754 [P.O.:600; I.V.:734; IV Piggyback:420] Out: 2725 [Urine:2725] Intake/Output this shift: No intake/output data recorded.       Exam    General- alert and comfortable   Lungs- clear without rales, wheezes   Cor- regular rate and rhythm, no murmur , gallop   Abdomen- soft, non-tender   Extremities - warm, non-tender, minimal edema   Neuro- oriented, appropriate, no focal weakness   Lab Results:  Recent Labs  06/02/16 1733 06/02/16 1745 06/03/16 0320  WBC 7.4  --  7.1  HGB 12.3* 13.9 11.3*  HCT 37.4* 41.0 34.0*  PLT 150  --  132*   BMET:  Recent Labs  06/02/16 0334  06/02/16 1745 06/03/16 0320  NA 137  --  137 136  K 3.9  --  4.1 3.7  CL 103  --  96* 99*  CO2 28  --   --  33*  GLUCOSE 102*  --  125* 93  BUN 7  --  8 10  CREATININE 0.82  < > 1.00 1.03  CALCIUM 8.2*  --   --  8.6*  < > = values in this interval not displayed.  PT/INR:  Recent Labs  06/01/16 1515  LABPROT 17.5*  INR 1.42   ABG    Component Value Date/Time   PHART 7.342 (L) 06/01/2016 2137   HCO3 26.8 06/01/2016 2137   TCO2 31 06/02/2016 1745   ACIDBASEDEF 1.0 06/01/2016 1837   O2SAT 98.0 06/01/2016 2137   CBG (last 3)   Recent Labs  06/03/16 0006 06/03/16 0352 06/03/16 0917  GLUCAP 128* 100* 97    Assessment/Plan: S/P Procedure(s) (LRB): CORONARY ARTERY BYPASS GRAFTING (CABG) x 5 using the left internal mammary artery as well as the right greater saphenous vein harvested endoscopically. LIMA to LAD, SVG to DIAGONAL, SVG SEQUENTIALLY to DISTAL CIRCUMFLEX and OM, SVG to RCA (N/A) TRANSESOPHAGEAL ECHOCARDIOGRAM (TEE) (N/A) Mobilize Diuresis Plan for transfer to step-down: see transfer orders   LOS: 3 days    Christian Black 06/03/2016

## 2016-06-04 ENCOUNTER — Encounter (HOSPITAL_COMMUNITY): Payer: Self-pay | Admitting: Cardiothoracic Surgery

## 2016-06-04 ENCOUNTER — Inpatient Hospital Stay (HOSPITAL_COMMUNITY): Payer: Medicare Other

## 2016-06-04 DIAGNOSIS — E440 Moderate protein-calorie malnutrition: Secondary | ICD-10-CM | POA: Insufficient documentation

## 2016-06-04 LAB — CBC
HCT: 33.3 % — ABNORMAL LOW (ref 39.0–52.0)
Hemoglobin: 11 g/dL — ABNORMAL LOW (ref 13.0–17.0)
MCH: 29.3 pg (ref 26.0–34.0)
MCHC: 33 g/dL (ref 30.0–36.0)
MCV: 88.6 fL (ref 78.0–100.0)
Platelets: 159 10*3/uL (ref 150–400)
RBC: 3.76 MIL/uL — ABNORMAL LOW (ref 4.22–5.81)
RDW: 13.6 % (ref 11.5–15.5)
WBC: 8 10*3/uL (ref 4.0–10.5)

## 2016-06-04 LAB — BASIC METABOLIC PANEL
Anion gap: 4 — ABNORMAL LOW (ref 5–15)
BUN: 15 mg/dL (ref 6–20)
CO2: 34 mmol/L — ABNORMAL HIGH (ref 22–32)
Calcium: 8.7 mg/dL — ABNORMAL LOW (ref 8.9–10.3)
Chloride: 97 mmol/L — ABNORMAL LOW (ref 101–111)
Creatinine, Ser: 0.97 mg/dL (ref 0.61–1.24)
GFR calc Af Amer: >60 mL/min (ref >60)
GFR calc non Af Amer: >60 mL/min (ref >60)
Glucose, Bld: 138 mg/dL — ABNORMAL HIGH (ref 65–99)
Potassium: 3.6 mmol/L (ref 3.5–5.1)
Sodium: 135 mmol/L (ref 135–145)

## 2016-06-04 LAB — GLUCOSE, CAPILLARY: Glucose-Capillary: 169 mg/dL — ABNORMAL HIGH (ref 65–99)

## 2016-06-04 MED ORDER — CHLORHEXIDINE GLUCONATE CLOTH 2 % EX PADS
6.0000 | MEDICATED_PAD | Freq: Every day | CUTANEOUS | Status: DC
  Administered 2016-06-04 – 2016-06-05 (×2): 6 via TOPICAL

## 2016-06-04 MED ORDER — ENOXAPARIN SODIUM 30 MG/0.3ML ~~LOC~~ SOLN
30.0000 mg | SUBCUTANEOUS | Status: DC
  Administered 2016-06-04 – 2016-06-05 (×2): 30 mg via SUBCUTANEOUS
  Filled 2016-06-04 (×2): qty 0.3

## 2016-06-04 MED ORDER — ACETYLCYSTEINE 20 % IN SOLN
2.0000 mL | Freq: Four times a day (QID) | RESPIRATORY_TRACT | Status: AC
  Administered 2016-06-04 – 2016-06-05 (×4): 2 mL via RESPIRATORY_TRACT
  Filled 2016-06-04 (×2): qty 4
  Filled 2016-06-04: qty 2
  Filled 2016-06-04: qty 4

## 2016-06-04 MED ORDER — POTASSIUM CHLORIDE 2 MEQ/ML IV SOLN
30.0000 meq | Freq: Once | INTRAVENOUS | Status: AC
  Administered 2016-06-04: 30 meq via INTRAVENOUS
  Filled 2016-06-04: qty 15

## 2016-06-04 MED ORDER — SODIUM CHLORIDE 0.9% FLUSH: 10.0000 mL | INTRAVENOUS | Status: DC | PRN

## 2016-06-04 MED ORDER — AMIODARONE HCL IN DEXTROSE 360-4.14 MG/200ML-% IV SOLN
30.0000 mg/h | INTRAVENOUS | Status: DC
  Administered 2016-06-04 (×2): 30 mg/h via INTRAVENOUS
  Filled 2016-06-04 (×2): qty 200

## 2016-06-04 MED ORDER — FUROSEMIDE 10 MG/ML IJ SOLN
40.0000 mg | Freq: Once | INTRAMUSCULAR | Status: AC
  Administered 2016-06-04: 40 mg via INTRAVENOUS
  Filled 2016-06-04: qty 4

## 2016-06-04 MED ORDER — ENSURE ENLIVE PO LIQD
237.0000 mL | Freq: Two times a day (BID) | ORAL | Status: DC
  Administered 2016-06-04: 237 mL via ORAL

## 2016-06-04 MED ORDER — AMIODARONE LOAD VIA INFUSION
150.0000 mg | Freq: Once | INTRAVENOUS | Status: AC
  Administered 2016-06-04: 150 mg via INTRAVENOUS
  Filled 2016-06-04: qty 83.34

## 2016-06-04 MED ORDER — AMIODARONE HCL IN DEXTROSE 360-4.14 MG/200ML-% IV SOLN
INTRAVENOUS | Status: AC
  Filled 2016-06-04: qty 200

## 2016-06-04 MED ORDER — AMIODARONE HCL IN DEXTROSE 360-4.14 MG/200ML-% IV SOLN
60.0000 mg/h | INTRAVENOUS | Status: AC
Start: 2016-06-04 — End: 2016-06-04
  Administered 2016-06-04 (×2): 60 mg/h via INTRAVENOUS
  Filled 2016-06-04: qty 200

## 2016-06-04 MED ORDER — LEVALBUTEROL HCL 0.63 MG/3ML IN NEBU
0.6300 mg | INHALATION_SOLUTION | Freq: Four times a day (QID) | RESPIRATORY_TRACT | Status: DC
  Administered 2016-06-04 – 2016-06-06 (×8): 0.63 mg via RESPIRATORY_TRACT
  Filled 2016-06-04 (×8): qty 3

## 2016-06-04 MED ORDER — SODIUM CHLORIDE 0.9% FLUSH
10.0000 mL | Freq: Two times a day (BID) | INTRAVENOUS | Status: DC
  Administered 2016-06-04: 10 mL

## 2016-06-04 NOTE — Progress Notes (Signed)
TCTS BRIEF SICU PROGRESS NOTE  3 Days Post-Op  S/P Procedure(s) (LRB): CORONARY ARTERY BYPASS GRAFTING (CABG) x 5 using the left internal mammary artery as well as the right greater saphenous vein harvested endoscopically. LIMA to LAD, SVG to DIAGONAL, SVG SEQUENTIALLY to DISTAL CIRCUMFLEX and OM, SVG to RCA (N/A) TRANSESOPHAGEAL ECHOCARDIOGRAM (TEE) (N/A)   Stable day Maintaining NSR w/ stable BP Breathing comfortably w/ O2 sats 94% on 2 L/min UOP adequate  Plan: Continue current plan  Rexene Alberts, MD 06/04/2016 6:50 PM

## 2016-06-04 NOTE — Evaluation (Signed)
Physical Therapy Evaluation Patient Details Name: Christian Black MRN: [id number] DOB: 1946/01/28 Today's Date: 06/04/2016   History of Present Illness  71 y.o. male s/p CABG x 5 on 3/2.   Clinical Impression  Pt presents with decreased strength, balance and activity tolerance secondary to above. PTA pt was I, however, now requires assist for all mobility. Recommend d/c to SNF for return to mod-I level of function and safe transition home. Acute PT will follow and reassess d/c recommendation as able.     Follow Up Recommendations SNF;Supervision/Assistance - 24 hour    Equipment Recommendations  None recommended by PT (to be determined)    Recommendations for Other Services       Precautions / Restrictions Precautions Precautions: Fall;Sternal Precaution Comments: pt able to recall 3/5, pt re-educated, pt reports verbal understanding Restrictions Weight Bearing Restrictions: Yes Other Position/Activity Restrictions: UE on lifting restrictions and ROM restrictions from sternal precautions      Mobility  Bed Mobility               General bed mobility comments: seated in chair upon arrival  Transfers Overall transfer level: Needs assistance Equipment used: None Transfers: Sit to/from Stand Sit to Stand: Min assist         General transfer comment: verbal cues and use of pillow for safety with sternal precautions; cues for use of rocking technique  Ambulation/Gait Ambulation/Gait assistance: Min assist;+2 safety/equipment Ambulation Distance (Feet): 300 Feet Assistive device:  (eva walker) Gait Pattern/deviations: Step-through pattern;Decreased step length - right;Decreased step length - left;Decreased stride length;Trunk flexed Gait velocity: decreased Gait velocity interpretation: Below normal speed for age/gender General Gait Details: slow speed with poor control of eva walker; v/c for steering walker and upright posture; verbal cues for safety with sternal  precautions; sats >90% on 2LO2  Stairs            Wheelchair Mobility    Modified Rankin (Stroke Patients Only)       Balance Overall balance assessment: Needs assistance Sitting-balance support: Feet supported;No upper extremity supported Sitting balance-Leahy Scale: Good Sitting balance - Comments: sat edge of chair with MMT without LOB   Standing balance support: Bilateral upper extremity supported Standing balance-Leahy Scale: Poor Standing balance comment: reliant on eva walker                             Pertinent Vitals/Pain Pain Assessment: No/denies pain    Home Living Family/patient expects to be discharged to:: Private residence Living Arrangements: Non-relatives/Friends Available Help at Discharge: Family;Friend(s);Available 24 hours/day Type of Home: House Home Access: Stairs to enter Entrance Stairs-Rails: None Entrance Stairs-Number of Steps: 3 Home Layout: One level Home Equipment: Cane - single point;Grab bars - tub/shower      Prior Function Level of Independence: Independent         Comments: was driving and grocery shopping PTA     Hand Dominance   Dominant Hand: Right    Extremity/Trunk Assessment   Upper Extremity Assessment Upper Extremity Assessment: Generalized weakness (no MMT due to sternal precautions)    Lower Extremity Assessment Lower Extremity Assessment: Generalized weakness    Cervical / Trunk Assessment Cervical / Trunk Assessment: Kyphotic  Communication   Communication: No difficulties  Cognition Arousal/Alertness: Awake/alert Behavior During Therapy: WFL for tasks assessed/performed Overall Cognitive Status: Within Functional Limits for tasks assessed  General Comments General comments (skin integrity, edema, etc.): pacing wires in place    Exercises     Assessment/Plan    PT Assessment Patient needs continued PT services  PT Problem List Decreased  strength;Decreased activity tolerance;Decreased balance;Decreased mobility;Decreased knowledge of precautions;Decreased knowledge of use of DME       PT Treatment Interventions DME instruction;Gait training;Stair training;Therapeutic activities;Therapeutic exercise;Balance training;Patient/family education    PT Goals (Current goals can be found in the Care Plan section)  Acute Rehab PT Goals Patient Stated Goal: get out of here PT Goal Formulation: With patient Time For Goal Achievement: 06/18/16 Potential to Achieve Goals: Good    Frequency Min 3X/week   Barriers to discharge Decreased caregiver support unsure of level of assist friend can provide    Co-evaluation               End of Session Equipment Utilized During Treatment: Gait belt;Oxygen (2LO2) Activity Tolerance: Patient tolerated treatment well Patient left: in chair;with call bell/phone within reach Nurse Communication: Mobility status PT Visit Diagnosis: Unsteadiness on feet (R26.81);Other abnormalities of gait and mobility (R26.89);Muscle weakness (generalized) (M62.81);Difficulty in walking, not elsewhere classified (R26.2)         Time: 0900-0930 PT Time Calculation (min) (ACUTE ONLY): 30 min   Charges:   PT Evaluation $PT Eval Moderate Complexity: 1 Procedure PT Treatments $Gait Training: 8-22 mins   PT G CodesTracie Harrier 2016-06-21, 12:06 PM   Tracie Harrier, SPT Acute Rehab SPT [phone]

## 2016-06-04 NOTE — Progress Notes (Signed)
Peripherally Inserted Central Catheter/Midline Placement  The IV Nurse has discussed with the patient and/or persons authorized to consent for the patient, the purpose of this procedure and the potential benefits and risks involved with this procedure.  The benefits include less needle sticks, lab draws from the catheter, and the patient may be discharged home with the catheter. Risks include, but not limited to, infection, bleeding, blood clot (thrombus formation), and puncture of an artery; nerve damage and irregular heartbeat and possibility to perform a PICC exchange if needed/ordered by physician.  Alternatives to this procedure were also discussed.  Bard Power PICC patient education guide, fact sheet on infection prevention and patient information card has been provided to patient /or left at bedside.    PICC/Midline Placement Documentation  PICC Double Lumen 06/04/16 PICC Right Brachial 43 cm (Active)  Indication for Insertion or Continuance of Line Prolonged intravenous therapies 06/04/2016  2:00 PM  Dressing Change Due 06/11/16 06/04/2016  2:00 PM       Jule Economy Horton 06/04/2016, 2:54 PM

## 2016-06-04 NOTE — Progress Notes (Signed)
Patient ID: Christian Black, male   DOB: 1945-07-30, 71 y.o.   MRN: [id number] TCTS DAILY ICU PROGRESS NOTE                   [address]            [place] [address]          [phone]   3 Days Post-Op Procedure(s) (LRB): CORONARY ARTERY BYPASS GRAFTING (CABG) x 5 using the left internal mammary artery as well as the right greater saphenous vein harvested endoscopically. LIMA to LAD, SVG to DIAGONAL, SVG SEQUENTIALLY to DISTAL CIRCUMFLEX and OM, SVG to RCA (N/A) TRANSESOPHAGEAL ECHOCARDIOGRAM (TEE) (N/A)  Total Length of Stay:  LOS: 4 days   Subjective: Up in chair, alert this am, episode of a flutter during night, now sinus   Objective: Vital signs in last 24 hours: Temp:  [97.6 F (36.4 C)-98.6 F (37 C)] 98.5 F (36.9 C) (03/05 0400) Pulse Rate:  [80-130] 85 (03/05 0600) Cardiac Rhythm: (P) Normal sinus rhythm (03/05 0600) Resp:  [18-34] 21 (03/05 0600) BP: (107-144)/(62-98) 128/62 (03/05 0600) SpO2:  [86 %-100 %] 97 % (03/05 0600) Weight:  [163 lb 5.8 oz (74.1 kg)] (P) 163 lb 5.8 oz (74.1 kg) (03/05 0600)  Filed Weights   06/02/16 0430 06/03/16 0500 06/04/16 0600  Weight: 167 lb 15.9 oz (76.2 kg) 163 lb 12.8 oz (74.3 kg) (P) 163 lb 5.8 oz (74.1 kg)    Weight change:    Hemodynamic parameters for last 24 hours:    Intake/Output from previous day: 03/04 0701 - 03/05 0700 In: 1036.5 [P.O.:600; I.V.:386.5; IV Piggyback:50] Out: 411 [Urine:411]  Intake/Output this shift: No intake/output data recorded.  Current Meds: Scheduled Meds: . acetaminophen  1,000 mg Oral Q6H   Or  . acetaminophen (TYLENOL) oral liquid 160 mg/5 mL  1,000 mg Per Tube Q6H  . acetylcysteine  4 mL Nebulization TID  . aspirin EC  325 mg Oral Daily   Or  . aspirin  324 mg Per Tube Daily  . atorvastatin  80 mg Oral q1800  . bisacodyl  10 mg Oral Daily   Or  . bisacodyl  10 mg Rectal Daily  . cefTAZidime (FORTAZ)  IV  1 g Intravenous Q8H  . Chlorhexidine Gluconate  Cloth  6 each Topical Daily  . docusate sodium  200 mg Oral Daily  . furosemide  40 mg Oral Daily  . guaiFENesin  600 mg Oral BID  . levalbuterol  0.63 mg Nebulization TID  . mouth rinse  15 mL Mouth Rinse QID  . metoCLOPramide (REGLAN) injection  10 mg Intravenous Q6H  . metoprolol tartrate  25 mg Oral BID  . pantoprazole  40 mg Oral Daily  . potassium chloride (KCL MULTIRUN) 30 mEq in 265 mL IVPB  30 mEq Intravenous Once  . sodium chloride flush  10-40 mL Intracatheter Q12H  . sodium chloride flush  3 mL Intravenous Q12H  . sodium chloride flush  3 mL Intravenous Q12H   Continuous Infusions: . sodium chloride Stopped (06/02/16 2100)  . amiodarone     PRN Meds:.sodium chloride, magnesium hydroxide, metoprolol, ondansetron (ZOFRAN) IV, sodium chloride flush, sodium chloride flush, sodium chloride flush, traMADol  General appearance: alert, cooperative, appears older than stated age and no distress Neurologic: intact Heart: regular rate and rhythm, S1, S2 normal, no murmur, click, rub or gallop Lungs: diminished breath sounds bibasilar Abdomen: soft, non-tender; bowel sounds normal; no masses,  no  organomegaly Extremities: extremities normal, atraumatic, no cyanosis or edema and Homans sign is negative, no sign of DVT Wound: sternum stable  Lab Results: CBC: Recent Labs  06/03/16 0320 06/04/16 0336  WBC 7.1 8.0  HGB 11.3* 11.0*  HCT 34.0* 33.3*  PLT 132* 159   BMET:  Recent Labs  06/03/16 0320 06/04/16 0336  NA 136 135  K 3.7 3.6  CL 99* 97*  CO2 33* 34*  GLUCOSE 93 138*  BUN 10 15  CREATININE 1.03 0.97  CALCIUM 8.6* 8.7*    CMET: Lab Results  Component Value Date   WBC 8.0 06/04/2016   HGB 11.0 (L) 06/04/2016   HCT 33.3 (L) 06/04/2016   PLT 159 06/04/2016   GLUCOSE 138 (H) 06/04/2016   CHOL 195 05/23/2016   TRIG 38 05/23/2016   HDL 110 05/23/2016   LDLCALC 77 05/23/2016   ALT 14 05/25/2016   AST 25 05/25/2016   NA 135 06/04/2016   K 3.6 06/04/2016     CL 97 (L) 06/04/2016   CREATININE 0.97 06/04/2016   BUN 15 06/04/2016   CO2 34 (H) 06/04/2016   TSH 1.37 11/01/2015   PSA 0.84 11/01/2015   INR 1.42 06/01/2016   HGBA1C 5.6 05/31/2016      PT/INR:  Recent Labs  06/01/16 1515  LABPROT 17.5*  INR 1.42   Radiology: Dg Chest Port 1 View  Result Date: 06/04/2016 CLINICAL DATA:  Status post coronary artery bypass graft. EXAM: PORTABLE CHEST 1 VIEW COMPARISON:  Radiographs June 03, 2016. FINDINGS: Stable cardiomediastinal silhouette. Sternotomy wires are noted. Right internal jugular catheters are unchanged in position. No definite pneumothorax is seen at this time. Old left rib fractures are noted. Stable bibasilar opacities, right greater than left are noted concerning for atelectasis with associated pleural effusions. IMPRESSION: No definite pneumothorax is seen at this time. Stable bibasilar opacities as described above. Electronically Signed   By: Marijo Conception, M.D.   On: 06/04/2016 07:12     Assessment/Plan: S/P Procedure(s) (LRB): CORONARY ARTERY BYPASS GRAFTING (CABG) x 5 using the left internal mammary artery as well as the right greater saphenous vein harvested endoscopically. LIMA to LAD, SVG to DIAGONAL, SVG SEQUENTIALLY to DISTAL CIRCUMFLEX and OM, SVG to RCA (N/A) TRANSESOPHAGEAL ECHOCARDIOGRAM (TEE) (N/A) Mobilize Diuresis Renal function stable Episode of a flutter last pm, now sinus on iv amiodarone Work on pulmonary toilet Place pic , remove central line Keep in unit for work on respiratory status.    Christian Black 06/04/2016 7:15 AM

## 2016-06-04 NOTE — Op Note (Signed)
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  SURGICAL PROCEDURE:  Coronary artery bypass grafting x5 with the left internal mammary to the left anterior descending coronary artery, reverse saphenous vein graft to the diagonal coronary artery, sequential reverse saphenous vein graft to the first obtuse marginal and distal circumflex, and reverse saphenous vein graft to the distal right coronary artery with right leg thigh and calf, greater saphenous vein harvesting endoscopically.  SURGEON:  Lanelle Bal, MD.  FIRST ASSISTANT:  Lars Pinks, PA.  BRIEF HISTORY:  The patient is a 71 year old male with long-term history of smoking, who presented to his primary care doctor with an abnormal EKG.  He was ultimately referred to Cardiology for evaluation.  Cardiac catheterization was performed by Dr. Angelena Form, which demonstrated preserved LV function with heavily calcified left main and proximal LAD with 80% left main obstruction proximal LAD disease and 70% obstruction involving the circumflex branches and a long 70% narrowing of the mid right coronary artery.  Because of the patient's significant three- vessel coronary artery disease including left main obstruction, coronary artery bypass grafting was recommended to the patient, who agreed and signed informed consent.  DESCRIPTION OF PROCEDURE:  With Swan-Ganz and arterial line monitors in place, the patient underwent general endotracheal anesthesia  without incident.  Skin of chest and legs was prepped with Betadine and draped in usual sterile manner.  Dr. Linna Caprice also placed a TEE probe. Appropriate time-out was performed and we proceeded with endoscopic harvesting of the right greater saphenous vein.  The vein was significantly branched and had a duplicate system, but was of satisfactory length and quality.  Median sternotomy was performed.  Left internal mammary artery was dissected down as a pedicle graft.  The distal artery was divided, had good free flow.  Pericardium was opened. Overall, ventricular function appeared preserved.  The patient was systemically heparinized.  The ascending aorta was cannulated.  The right atrium was cannulated and aortic root vent cardioplegia needle was introduced into the ascending aorta.  The patient was placed on cardiopulmonary bypass 2.4 L/min/m2.  Sites of anastomosis were inspected and dissected out of the epicardium.  The patient's body temperature was cooled to 32 degrees.  Aortic crossclamp was applied. 500 mL of cold blood potassium cardioplegia was administered with diastolic arrest of the heart.  Myocardial septal temperature was monitored throughout the crossclamp.  Attention was turned first to the distal right coronary artery, which was opened and admitted a 1.5 mm probe distally.  Using a running 7-0 Prolene, distal anastomosis was performed.  The heart was then elevated and the large first obtuse marginal vessel was partially intramyocardial.  This vessel was opened and admitted a 1.5 mm probe using a diamond-type, side-to-side anastomosis  with a running 8-0 Prolene, a segment of reverse saphenous vein graft was anastomosed to the obtuse marginal.  Distal extent of the same vein went to a smaller distal circumflex branch, which was 1.2 mm in size.  Using a running 8-0 Prolene, distal anastomosis was performed. Attention was then turned to the diagonal coronary artery, which  was opened, admitted 1.5 mm probe.  Using a running 7-0 Prolene, distal anastomosis was performed with a second reverse saphenous vein graft. The left anterior descending coronary artery was intramyocardial and this was dissected out of its intramyocardial position and was opened, admitted a 1.5 mm probe distally.  Using a running 8-0 Prolene, the left internal mammary artery was anastomosed to the left anterior descending coronary artery.  With crossclamp still in place, three punch aortotomies were performed and each of the 3 vein grafts anastomosed to the ascending aorta.  The bulldog on the mammary artery was removed with rise in myocardial septal temperature.  The heart was allowed to passively fill and de-air and the proximal anastomoses were completed. The cross-clamp was removed with a total crossclamp time of 118. The patient required electric defibrillation to return to a sinus rhythm.  Sites of anastomosis were inspected and were free of bleeding. The patient was then ventilated and weaned from cardiopulmonary bypass without difficulty, remained hemodynamically stable.  He was decannulated in usual fashion.  Protamine sulfate was administered with operative field hemostatic.  Atrial and ventricular pacing wires were applied.  With operative field hemostatic, the pericardium was loosely reapproximated.  Left pleural tube was left in place.  A Blake mediastinal drain was left.  Sternum was closed with #6 stainless steel wire.  Fascia was closed with interrupted 0 Vicryl, running 3-0 Vicryl subcutaneous tissue, 4-0 subcuticular stitch in skin edges.  Dry dressings were applied.  Sponge and needle count was reported as correct at completion of procedure.  The patient tolerated procedure without obvious complication and was transferred to the surgical intensive care unit for further postoperative care.  He did not require any blood bank blood products.  Total pump time was  169.     Lanelle Bal, MD     EG/MEDQ  D:  06/04/2016  T:  06/04/2016  Job:  [id number]

## 2016-06-04 NOTE — Progress Notes (Signed)
Initial Nutrition Assessment  DOCUMENTATION CODES:   Non-severe (moderate) malnutrition in context of chronic illness  INTERVENTION:    Ensure Enlive po BID, each supplement provides 350 kcal and 20 grams of protein  NUTRITION DIAGNOSIS:   Malnutrition related to chronic illness as evidenced by moderate depletion of body fat, moderate depletions of muscle mass  GOAL:   Patient will meet greater than or equal to 90% of their needs  MONITOR:   PO intake, Supplement acceptance, Labs, Weight trends, I & O's  REASON FOR ASSESSMENT:   Other (Comment) (ICU, poor PO intake)  ASSESSMENT:   71 y.o. Male with COPD, ongoing tobacco abuse, and prior colon cancer s/p resection recently seen by Dr. Oval Linsey for abnormal EKG. He was referred for a stress echo 05/10/16 that revealed LVEF 60%.  However with stress there was hypokinesis of the apical anterior, mid anteroseptal, apical inferior and apical myocardium. Cath showed severe 3 V disease.  Pt s/p procedure 3/2: CORONARY ARTERY BYPASS GRAFTING (CABG) x 5  Transferred from 2W-Cardiac to SICU post-op. Breakfast on tray table revealed ~25% consumed. Reports a decreased appetite, however, was eating well prior to surgery and PTA.  Typically consumes 3 meals per day at home (ex: hamburger or hot dog for lunch). Would benefit from nutrition supplements during hospital stay >> pt amenable. Labs and medications reviewed. CBG's [id number].  Nutrition-Focused physical exam completed. Findings are moderate fat depletion, moderate muscle depletion, and no edema.   Diet Order:  Diet Heart Room service appropriate? Yes; Fluid consistency: Thin  Skin:  Reviewed, no issues  Last BM:  3/1  Height:   Ht Readings from Last 1 Encounters:  06/01/16 6\' 4"  (1.93 m)    Weight:   Wt Readings from Last 1 Encounters:  06/04/16 163 lb 5.8 oz (74.1 kg)    Ideal Body Weight:  91.8 kg  BMI:  Body mass index is 19.88 kg/m.  Estimated  Nutritional Needs:   Kcal:  1700-1900  Protein:  110-120 gm  Fluid:  1.7-1.9 L  EDUCATION NEEDS:   No education needs identified at this time  Arthur Holms, RD, LDN Pager #: [phone] After-Hours Pager #: [phone]

## 2016-06-04 NOTE — Care Management Note (Addendum)
Case Management Note  Patient Details  Name: DORUS BRUSCHI MRN: [id number] Date of Birth: 1946/03/31  Subjective/Objective:   S/p CABG x 5  On 3/2, he states he lives with a friend , Varney Biles [id number], he states she can provide 24 hr care for him,  he has medication coverage and he has a pcp , Dr. Quay Burow.  All, tubes out.  NCM will cont to follow for dc needs.  Per pt eval rec SNF, CSW consulted.                              Action/Plan:   Expected Discharge Date:                  Expected Discharge Plan:  Skilled Nursing Facility  In-House Referral:  Clinical Social Work  Discharge planning Services  CM Consult  Post Acute Care Choice:    Choice offered to:     DME Arranged:    DME Agency:     HH Arranged:    [place] Agency:     Status of Service:  Completed, signed off  If discussed at [employer], dates discussed:    Additional Comments:  Zenon Mayo, RN 06/04/2016, 12:44 PM

## 2016-06-04 NOTE — Progress Notes (Signed)
Rest of med wasted in [place]. Med order given 2mg  of Acetylcysteine. Med given with SAB.

## 2016-06-05 ENCOUNTER — Inpatient Hospital Stay (HOSPITAL_COMMUNITY): Payer: Medicare Other

## 2016-06-05 LAB — CBC
HCT: 30.6 % — ABNORMAL LOW (ref 39.0–52.0)
Hemoglobin: 10.2 g/dL — ABNORMAL LOW (ref 13.0–17.0)
MCH: 29.7 pg (ref 26.0–34.0)
MCHC: 33.3 g/dL (ref 30.0–36.0)
MCV: 89 fL (ref 78.0–100.0)
Platelets: 156 10*3/uL (ref 150–400)
RBC: 3.44 MIL/uL — ABNORMAL LOW (ref 4.22–5.81)
RDW: 14.2 % (ref 11.5–15.5)
WBC: 6.1 10*3/uL (ref 4.0–10.5)

## 2016-06-05 LAB — BASIC METABOLIC PANEL
Anion gap: 9 (ref 5–15)
BUN: 16 mg/dL (ref 6–20)
CO2: 32 mmol/L (ref 22–32)
Calcium: 8.4 mg/dL — ABNORMAL LOW (ref 8.9–10.3)
Chloride: 92 mmol/L — ABNORMAL LOW (ref 101–111)
Creatinine, Ser: 0.92 mg/dL (ref 0.61–1.24)
GFR calc Af Amer: >60 mL/min (ref >60)
GFR calc non Af Amer: >60 mL/min (ref >60)
Glucose, Bld: 179 mg/dL — ABNORMAL HIGH (ref 65–99)
Potassium: 3.1 mmol/L — ABNORMAL LOW (ref 3.5–5.1)
Sodium: 133 mmol/L — ABNORMAL LOW (ref 135–145)

## 2016-06-05 LAB — GLUCOSE, CAPILLARY
GLUCOSE-CAPILLARY: 129 mg/dL — AB (ref 65–99)
Glucose-Capillary: 129 mg/dL — ABNORMAL HIGH (ref 65–99)
Glucose-Capillary: 108 mg/dL — ABNORMAL HIGH (ref 65–99)

## 2016-06-05 MED ORDER — AMIODARONE HCL 200 MG PO TABS: 200.0000 mg | ORAL_TABLET | Freq: Every day | ORAL | Status: DC

## 2016-06-05 MED ORDER — ONDANSETRON HCL 4 MG/2ML IJ SOLN: 4.0000 mg | Freq: Four times a day (QID) | INTRAMUSCULAR | Status: DC | PRN

## 2016-06-05 MED ORDER — SODIUM CHLORIDE 0.9 % IV SOLN
30.0000 meq | Freq: Once | INTRAVENOUS | Status: AC
  Administered 2016-06-05: 30 meq via INTRAVENOUS
  Filled 2016-06-05 (×2): qty 15

## 2016-06-05 MED ORDER — ONDANSETRON HCL 4 MG PO TABS: 4.0000 mg | ORAL_TABLET | Freq: Four times a day (QID) | ORAL | Status: DC | PRN

## 2016-06-05 MED ORDER — BISACODYL 10 MG RE SUPP: 10.0000 mg | Freq: Every day | RECTAL | Status: DC | PRN

## 2016-06-05 MED ORDER — ASPIRIN EC 81 MG PO TBEC
81.0000 mg | DELAYED_RELEASE_TABLET | Freq: Every day | ORAL | Status: DC
  Administered 2016-06-05 – 2016-06-07 (×3): 81 mg via ORAL
  Filled 2016-06-05 (×3): qty 1

## 2016-06-05 MED ORDER — TRAMADOL HCL 50 MG PO TABS
50.0000 mg | ORAL_TABLET | ORAL | Status: DC | PRN
  Administered 2016-06-05: 100 mg via ORAL
  Filled 2016-06-05: qty 2

## 2016-06-05 MED ORDER — MOVING RIGHT ALONG BOOK
Freq: Once | Status: AC
  Administered 2016-06-05: 1
  Filled 2016-06-05: qty 1

## 2016-06-05 MED ORDER — SODIUM CHLORIDE 0.9 % IV SOLN: 250.0000 mL | INTRAVENOUS | Status: DC | PRN

## 2016-06-05 MED ORDER — SODIUM CHLORIDE 0.9% FLUSH: 3.0000 mL | Freq: Two times a day (BID) | INTRAVENOUS | Status: DC

## 2016-06-05 MED ORDER — PANTOPRAZOLE SODIUM 40 MG PO TBEC
40.0000 mg | DELAYED_RELEASE_TABLET | Freq: Every day | ORAL | Status: DC
  Administered 2016-06-05 – 2016-06-07 (×3): 40 mg via ORAL
  Filled 2016-06-05 (×2): qty 1

## 2016-06-05 MED ORDER — METOPROLOL TARTRATE 12.5 MG HALF TABLET
12.5000 mg | ORAL_TABLET | Freq: Two times a day (BID) | ORAL | Status: DC
  Administered 2016-06-05 – 2016-06-07 (×5): 12.5 mg via ORAL
  Filled 2016-06-05 (×5): qty 1

## 2016-06-05 MED ORDER — INSULIN ASPART 100 UNIT/ML ~~LOC~~ SOLN
0.0000 [IU] | Freq: Three times a day (TID) | SUBCUTANEOUS | Status: DC
  Administered 2016-06-05 (×2): 2 [IU] via SUBCUTANEOUS

## 2016-06-05 MED ORDER — BISACODYL 5 MG PO TBEC
10.0000 mg | DELAYED_RELEASE_TABLET | Freq: Every day | ORAL | Status: DC | PRN
  Administered 2016-06-05: 10 mg via ORAL

## 2016-06-05 MED ORDER — DOCUSATE SODIUM 100 MG PO CAPS
200.0000 mg | ORAL_CAPSULE | Freq: Every day | ORAL | Status: DC
  Administered 2016-06-05 – 2016-06-07 (×3): 200 mg via ORAL
  Filled 2016-06-05 (×2): qty 2

## 2016-06-05 MED ORDER — SODIUM CHLORIDE 0.9% FLUSH: 3.0000 mL | INTRAVENOUS | Status: DC | PRN

## 2016-06-05 MED ORDER — AMIODARONE HCL 200 MG PO TABS
200.0000 mg | ORAL_TABLET | Freq: Two times a day (BID) | ORAL | Status: DC
  Administered 2016-06-05 – 2016-06-07 (×4): 200 mg via ORAL
  Filled 2016-06-05 (×5): qty 1

## 2016-06-05 NOTE — Progress Notes (Signed)
Patient ID: Christian Black, male   DOB: 11-Sep-1945, 71 y.o.   MRN: [id number] TCTS DAILY ICU PROGRESS NOTE                   [address]            [place] [address]          [phone]   4 Days Post-Op Procedure(s) (LRB): CORONARY ARTERY BYPASS GRAFTING (CABG) x 5 using the left internal mammary artery as well as the right greater saphenous vein harvested endoscopically. LIMA to LAD, SVG to DIAGONAL, SVG SEQUENTIALLY to DISTAL CIRCUMFLEX and OM, SVG to RCA (N/A) TRANSESOPHAGEAL ECHOCARDIOGRAM (TEE) (N/A)  Total Length of Stay:  LOS: 5 days   Subjective: Up to cahir , walded 300 feet this am, breathing better  Objective: Vital signs in last 24 hours: Temp:  [97.9 F (36.6 C)-98.7 F (37.1 C)] 98.6 F (37 C) (03/06 0700) Pulse Rate:  [65-91] 78 (03/06 0700) Cardiac Rhythm: Normal sinus rhythm (03/06 0400) Resp:  [15-32] 15 (03/06 0700) BP: (96-141)/(61-79) 97/71 (03/06 0700) SpO2:  [94 %-100 %] 95 % (03/06 0700) Weight:  [163 lb 2.3 oz (74 kg)] 163 lb 2.3 oz (74 kg) (03/06 0600)  Filed Weights   06/03/16 0500 06/04/16 0600 06/05/16 0600  Weight: 163 lb 12.8 oz (74.3 kg) 163 lb 5.8 oz (74.1 kg) 163 lb 2.3 oz (74 kg)    Weight change: -3.5 oz (-0.1 kg)   Hemodynamic parameters for last 24 hours:    Intake/Output from previous day: 03/05 0701 - 03/06 0700 In: 2135.8 [P.O.:1080; I.V.:640.8; IV Piggyback:415] Out: 1370 [id number]  Intake/Output this shift: No intake/output data recorded.  Current Meds: Scheduled Meds: . acetaminophen  1,000 mg Oral Q6H   Or  . acetaminophen (TYLENOL) oral liquid 160 mg/5 mL  1,000 mg Per Tube Q6H  . aspirin EC  325 mg Oral Daily   Or  . aspirin  324 mg Per Tube Daily  . atorvastatin  80 mg Oral q1800  . bisacodyl  10 mg Oral Daily   Or  . bisacodyl  10 mg Rectal Daily  . cefTAZidime (FORTAZ)  IV  1 g Intravenous Q8H  . Chlorhexidine Gluconate Cloth  6 each Topical Daily  . Chlorhexidine Gluconate Cloth  6  each Topical Daily  . docusate sodium  200 mg Oral Daily  . enoxaparin (LOVENOX) injection  30 mg Subcutaneous Q24H  . feeding supplement (ENSURE ENLIVE)  237 mL Oral BID BM  . furosemide  40 mg Oral Daily  . guaiFENesin  600 mg Oral BID  . levalbuterol  0.63 mg Nebulization Q6H  . metoCLOPramide (REGLAN) injection  10 mg Intravenous Q6H  . metoprolol tartrate  25 mg Oral BID  . pantoprazole  40 mg Oral Daily  . sodium chloride flush  10-40 mL Intracatheter Q12H  . sodium chloride flush  10-40 mL Intracatheter Q12H  . sodium chloride flush  3 mL Intravenous Q12H  . sodium chloride flush  3 mL Intravenous Q12H   Continuous Infusions: . sodium chloride 10 mL/hr at 06/05/16 0430  . amiodarone 30 mg/hr (06/05/16 0700)   PRN Meds:.sodium chloride, magnesium hydroxide, ondansetron (ZOFRAN) IV, sodium chloride flush, sodium chloride flush, sodium chloride flush, sodium chloride flush, traMADol  General appearance: alert, cooperative and no distress Neurologic: intact Heart: regular rate and rhythm, S1, S2 normal, no murmur, click, rub or gallop Lungs: diminished breath sounds bibasilar Abdomen: soft, non-tender; bowel sounds normal;  no masses,  no organomegaly Extremities: extremities normal, atraumatic, no cyanosis or edema and Homans sign is negative, no sign of DVT Wound: sternum stable   Lab Results: CBC: Recent Labs  06/04/16 0336 06/05/16 0336  WBC 8.0 6.1  HGB 11.0* 10.2*  HCT 33.3* 30.6*  PLT 159 156   BMET:  Recent Labs  06/04/16 0336 06/05/16 0336  NA 135 133*  K 3.6 3.1*  CL 97* 92*  CO2 34* 32  GLUCOSE 138* 179*  BUN 15 16  CREATININE 0.97 0.92  CALCIUM 8.7* 8.4*    CMET: Lab Results  Component Value Date   WBC 6.1 06/05/2016   HGB 10.2 (L) 06/05/2016   HCT 30.6 (L) 06/05/2016   PLT 156 06/05/2016   GLUCOSE 179 (H) 06/05/2016   CHOL 195 05/23/2016   TRIG 38 05/23/2016   HDL 110 05/23/2016   LDLCALC 77 05/23/2016   ALT 14 05/25/2016   AST 25  05/25/2016   NA 133 (L) 06/05/2016   K 3.1 (L) 06/05/2016   CL 92 (L) 06/05/2016   CREATININE 0.92 06/05/2016   BUN 16 06/05/2016   CO2 32 06/05/2016   TSH 1.37 11/01/2015   PSA 0.84 11/01/2015   INR 1.42 06/01/2016   HGBA1C 5.6 05/31/2016      PT/INR: No results for input(s): LABPROT, INR in the last 72 hours. Radiology: Dg Chest Port 1 View  Result Date: 06/05/2016 CLINICAL DATA:  Status post chest tube removal. EXAM: PORTABLE CHEST 1 VIEW COMPARISON:  Chest x-ray dated June 04, 2016 FINDINGS: The lungs are well-expanded. There is no pneumothorax. Bibasilar opacities persist. Small bilateral pleural effusions are present bilaterally. The heart is normal in size. The pulmonary vascularity is not engorged. The right internal jugular venous catheter and the right internal jugular Cordis sheath have been removed. A PICC line is in place via the right upper extremity with the tip in the distal third of the SVC. IMPRESSION: Slight interval improvement in bibasilar airspace opacities and probable small bilateral pleural effusions. No pneumothorax. The cardiac silhouette is normal in size. There is no pulmonary vascular congestion. Electronically Signed   By: David  Martinique M.D.   On: 06/05/2016 07:57     Assessment/Plan: S/P Procedure(s) (LRB): CORONARY ARTERY BYPASS GRAFTING (CABG) x 5 using the left internal mammary artery as well as the right greater saphenous vein harvested endoscopically. LIMA to LAD, SVG to DIAGONAL, SVG SEQUENTIALLY to DISTAL CIRCUMFLEX and OM, SVG to RCA (N/A) TRANSESOPHAGEAL ECHOCARDIOGRAM (TEE) (N/A) Transfer to stepdown hypokalemias , replace  Moderate Obstructive Airways Disease - Convert to po Cordarone - holding sinus   [name] 06/05/2016 8:41 AM

## 2016-06-06 ENCOUNTER — Inpatient Hospital Stay (HOSPITAL_COMMUNITY): Payer: Medicare Other

## 2016-06-06 DIAGNOSIS — Z951 Presence of aortocoronary bypass graft: Secondary | ICD-10-CM

## 2016-06-06 LAB — CBC
HCT: 29.6 % — ABNORMAL LOW (ref 39.0–52.0)
Hemoglobin: 9.7 g/dL — ABNORMAL LOW (ref 13.0–17.0)
MCH: 29.1 pg (ref 26.0–34.0)
MCHC: 32.8 g/dL (ref 30.0–36.0)
MCV: 88.9 fL (ref 78.0–100.0)
Platelets: 208 10*3/uL (ref 150–400)
RBC: 3.33 MIL/uL — ABNORMAL LOW (ref 4.22–5.81)
RDW: 14.1 % (ref 11.5–15.5)
WBC: 5.7 10*3/uL (ref 4.0–10.5)

## 2016-06-06 LAB — GLUCOSE, CAPILLARY
GLUCOSE-CAPILLARY: 92 mg/dL (ref 65–99)
GLUCOSE-CAPILLARY: 96 mg/dL (ref 65–99)
Glucose-Capillary: 129 mg/dL — ABNORMAL HIGH (ref 65–99)

## 2016-06-06 LAB — BASIC METABOLIC PANEL
Anion gap: 8 (ref 5–15)
BUN: 17 mg/dL (ref 6–20)
CO2: 31 mmol/L (ref 22–32)
Calcium: 8.6 mg/dL — ABNORMAL LOW (ref 8.9–10.3)
Chloride: 96 mmol/L — ABNORMAL LOW (ref 101–111)
Creatinine, Ser: 0.87 mg/dL (ref 0.61–1.24)
GFR calc Af Amer: >60 mL/min (ref >60)
GFR calc non Af Amer: >60 mL/min (ref >60)
Glucose, Bld: 97 mg/dL (ref 65–99)
Potassium: 3.3 mmol/L — ABNORMAL LOW (ref 3.5–5.1)
Sodium: 135 mmol/L (ref 135–145)

## 2016-06-06 LAB — CULTURE, RESPIRATORY W GRAM STAIN
Culture: NORMAL
Special Requests: NORMAL

## 2016-06-06 MED ORDER — ENOXAPARIN SODIUM 30 MG/0.3ML ~~LOC~~ SOLN: 30.0000 mg | SUBCUTANEOUS | Status: DC

## 2016-06-06 MED ORDER — POTASSIUM CHLORIDE CRYS ER 20 MEQ PO TBCR
40.0000 meq | EXTENDED_RELEASE_TABLET | Freq: Two times a day (BID) | ORAL | Status: AC
  Administered 2016-06-06 (×2): 40 meq via ORAL
  Filled 2016-06-06 (×2): qty 2

## 2016-06-06 MED ORDER — LEVALBUTEROL HCL 0.63 MG/3ML IN NEBU: 0.6300 mg | INHALATION_SOLUTION | Freq: Four times a day (QID) | RESPIRATORY_TRACT | Status: DC | PRN

## 2016-06-06 MED ORDER — ENOXAPARIN SODIUM 30 MG/0.3ML ~~LOC~~ SOLN
30.0000 mg | SUBCUTANEOUS | Status: DC
  Administered 2016-06-07: 30 mg via SUBCUTANEOUS
  Filled 2016-06-06 (×2): qty 0.3

## 2016-06-06 MED FILL — Heparin Sodium (Porcine) Inj 1000 Unit/ML: INTRAMUSCULAR | Qty: 10 | Status: AC

## 2016-06-06 MED FILL — Sodium Bicarbonate IV Soln 8.4%: INTRAVENOUS | Qty: 50 | Status: AC

## 2016-06-06 MED FILL — Lidocaine HCl IV Inj 20 MG/ML: INTRAVENOUS | Qty: 10 | Status: AC

## 2016-06-06 MED FILL — Electrolyte-R (PH 7.4) Solution: INTRAVENOUS | Qty: 4000 | Status: AC

## 2016-06-06 MED FILL — Mannitol IV Soln 20%: INTRAVENOUS | Qty: 500 | Status: AC

## 2016-06-06 MED FILL — Sodium Chloride IV Soln 0.9%: INTRAVENOUS | Qty: 2000 | Status: AC

## 2016-06-06 NOTE — Progress Notes (Signed)
CARDIAC REHAB PHASE I   PRE:  Rate/Rhythm: 70 SR    BP: sitting 115/66    SaO2: 95 RA  MODE:  Ambulation: 350 ft   POST:  Rate/Rhythm: 89 SR    BP: sitting 129/82     SaO2: 98 RA  Pt moving fairly well, used RW, min assist. Tired toward end of walk. No other c/o. To bed to have wires pulled. Sts he has a RW at home he can use.  [place], ACSM 06/06/2016 2:16 PM

## 2016-06-06 NOTE — Progress Notes (Signed)
  Amiodarone Drug - Drug Interaction Consult Note  Recommendations:  Amiodarone is metabolized by the cytochrome P450 system and therefore has the potential to cause many drug interactions. Amiodarone has an average plasma half-life of 50 days (range 20 to 100 days).   There is potential for drug interactions to occur several weeks or months after stopping treatment and the onset of drug interactions may be slow after initiating amiodarone.   [x]  Statins: Increased risk of myopathy. Other statins: counsel patients to report any muscle pain or weakness immediately.  []  Anticoagulants: Amiodarone can increase anticoagulant effect. Consider warfarin dose reduction. Patients should be monitored closely and the dose of anticoagulant altered accordingly, remembering that amiodarone levels take several weeks to stabilize.  []  Antiepileptics: Amiodarone can increase plasma concentration of phenytoin, the dose should be reduced. Note that small changes in phenytoin dose can result in large changes in levels. Monitor patient and counsel on signs of toxicity.  [x]  Beta blockers: increased risk of bradycardia, AV block and myocardial depression. Sotalol - avoid concomitant use.  []   Calcium channel blockers (diltiazem and verapamil): increased risk of bradycardia, AV block and myocardial depression.  []   Cyclosporine: Amiodarone increases levels of cyclosporine. Reduced dose of cyclosporine is recommended.  []  Digoxin dose should be halved when amiodarone is started.  []  Diuretics: increased risk of cardiotoxicity if hypokalemia occurs.  []  Oral hypoglycemic agents (glyburide, glipizide, glimepiride): increased risk of hypoglycemia. Patient's glucose levels should be monitored closely when initiating amiodarone therapy.   []  Drugs that prolong the QT interval:  Torsades de pointes risk may be increased with concurrent use - avoid if possible.  Monitor QTc, also keep magnesium/potassium WNL if  concurrent therapy can't be avoided. Marland Kitchen Antibiotics: e.g. fluoroquinolones, erythromycin. . Antiarrhythmics: e.g. quinidine, procainamide, disopyramide, sotalol. . Antipsychotics: e.g. phenothiazines, haloperidol.  . Lithium, tricyclic antidepressants, and methadone. Thank You,   Jorja Empie S. Alford Highland, PharmD, BCPS Clinical Staff Pharmacist Pager [phone]  Eilene Ghazi [facility]  06/06/2016 11:47 AM

## 2016-06-06 NOTE — Progress Notes (Addendum)
      [address]       [place] [address]             [phone]        5 Days Post-Op Procedure(s) (LRB): CORONARY ARTERY BYPASS GRAFTING (CABG) x 5 using the left internal mammary artery as well as the right greater saphenous vein harvested endoscopically. LIMA to LAD, SVG to DIAGONAL, SVG SEQUENTIALLY to DISTAL CIRCUMFLEX and OM, SVG to RCA (N/A) TRANSESOPHAGEAL ECHOCARDIOGRAM (TEE) (N/A)  Subjective: Patient without specific complaints this am.  Objective: Vital signs in last 24 hours: Temp:  [98 F (36.7 C)-99.1 F (37.3 C)] 99.1 F (37.3 C) (03/07 0506) Pulse Rate:  [73-85] 73 (03/07 0506) Cardiac Rhythm: Normal sinus rhythm (03/06 2010) Resp:  [15-23] 18 (03/07 0506) BP: (115-142)/(61-70) 115/65 (03/07 0506) SpO2:  [92 %-100 %] 100 % (03/07 0506) FiO2 (%):  [21 %] 21 % (03/06 2200) Weight:  [162 lb 4.1 oz (73.6 kg)] 162 lb 4.1 oz (73.6 kg) (03/07 0511)  Pre op weight 77.1 kg Current Weight  06/06/16 162 lb 4.1 oz (73.6 kg)      Intake/Output from previous day: 03/06 0701 - 03/07 0700 In: 93.5 [I.V.:93.5] Out: 750 [Urine:750]   Physical Exam:  Cardiovascular: RRR Pulmonary: Clear to auscultation bilaterally Abdomen: Soft, non tender, bowel sounds present. Extremities: No lower extremity edema. Wounds: Clean and dry.  No erythema or signs of infection.  Lab Results: CBC: Recent Labs  06/05/16 0336 06/06/16 0405  WBC 6.1 5.7  HGB 10.2* 9.7*  HCT 30.6* 29.6*  PLT 156 208   BMET:  Recent Labs  06/05/16 0336 06/06/16 0405  NA 133* 135  K 3.1* 3.3*  CL 92* 96*  CO2 32 31  GLUCOSE 179* 97  BUN 16 17  CREATININE 0.92 0.87  CALCIUM 8.4* 8.6*    PT/INR:  Lab Results  Component Value Date   INR 1.42 06/01/2016   INR 1.05 05/31/2016   INR 1.1 05/23/2016   ABG:  INR: Will add last result for INR, ABG once components are confirmed Will add last 4 CBG results once components are confirmed  Assessment/Plan:  1. CV -  Post op a fib. Maintaining SR in the 80's. On Amiodarone 200 mg bid and Lopressor 12.5 mg bid. 2.  Pulmonary - On room air. Mucinex for cough. CXR this am appears to show no pneumothorax, bibasilar atelectasis,and probable small effusions. Encourage incentive spirometer. 3. Remove EPW 4.  Acute blood loss anemia - H and H slightly decreased to 9.7 and 29.6 5. Supplement potassium 6. CBGs 129/108/92. Pre op HGA1C 5.6. Will stop accu checks and SS PRN 7. Possibly home in am  ZIMMERMAN,DONIELLE MPA-C 06/06/2016,7:24 AM  No elevated wbc, no fever, chest xray improved. IV antibiotics stopped yesterday , do not think patient has or had post op pneumonia. Poss home in am if arrangements complete. I have seen and examined Christian Black and agree with the above assessment  and plan.  [name] MD [facility] [phone] Office [phone] 06/06/2016 11:05 AM

## 2016-06-06 NOTE — Care Management Note (Addendum)
Case Management Note  Patient Details  Name: Christian Black MRN: [id number] Date of Birth: Aug 28, 1945  Subjective/Objective:                 Spoke with patient at the bedside. Family had just left. Patient states that he will be DC'd in AM and will go home with his exwife. Referral placed to [facility] clinical liaison Christian Black, pt accepted. Patient has a cane at home for use. PCP [facility], Christian Black   Action/Plan:   Expected Discharge Date:                  Expected Discharge Plan:  [place]  In-House Referral:  Clinical Social Work  Discharge planning Services  CM Consult  Post Acute Care Choice:    Choice offered to:     DME Arranged:    DME Agency:     HH Arranged:    [place] Agency:     Status of Service:  In process, will continue to follow  If discussed at Long Length of Stay Meetings, dates discussed:    Additional Comments:  Christian Collet, RN 06/06/2016, 5:08 PM

## 2016-06-06 NOTE — Consult Note (Signed)
[facility] Primary Care Navigator  06/06/2016  Christian Black Sep 24, 1945 [id number]   Went back to see patient after transferred out of ICU. Met with patient at the bedside to identify possible discharge needs. Patient reports that he was sent to the hospital by PCP for cardiac cath due to abnormal stress test which had led to this admission/ surgery.  Patient endorses Dr. Billey Gosling with [facility] as his primary care provider.    Patient recalls using [facility] in [place] to obtain medications without difficulty.   Patient states managing his medications at home straight out of the containers (not taking much).  He mentioned that ex-wife schedules and provides transportation to his doctors' appointments.  Patient verbalized that he has "live in care" from friends Wallene Huh and Nicole Kindred) who assist with his care at home.   Discharge plan is home with home health services per PT recommendation.  Patient voiced understanding to call primary care provider's office once he gets back home, for a post discharge follow-up appointment within a week or sooner if needed. Patient letter (with PCP's contact number) was provided as a reminder.  He denies any further health management needs or concerns at present. [facility] care management contact information provided in case future needs may arise.  For additional questions please contact:  Edwena Felty A. Jiovany Scheffel, BSN, RN-BC [facility] PRIMARY CARE Navigator Cell: [phone]

## 2016-06-06 NOTE — Progress Notes (Signed)
Physical Therapy Treatment/Discharge Patient Details Name: Christian Black MRN: [id number] DOB: 01/26/1946 Today's Date: 06/06/2016    History of Present Illness 71 y.o. male s/p CABG x 5 on 3/2.     PT Comments    Pt goals met and functioning near baseline. Pt at minimal risk for falls as indicated by score of 21/24 on DGI. Recommend d/c home with HHPT for safe transition home and return to PLOF. Pt with no further acute PT needs at this time. Please re-consult if needed. PT signing off.   Follow Up Recommendations  Home health PT;Supervision/Assistance - 24 hour     Equipment Recommendations  None recommended by PT    Recommendations for Other Services       Precautions / Restrictions Precautions Precautions: Sternal;Fall Precaution Comments: able to recall sternal precautions Restrictions Weight Bearing Restrictions: Yes Other Position/Activity Restrictions: sternal precautions    Mobility  Bed Mobility Overal bed mobility: Needs Assistance Bed Mobility: Supine to Sit     Supine to sit: Supervision     General bed mobility comments: supervision for safety, compliant with sternal precautions  Transfers Overall transfer level: Needs assistance Equipment used: Rolling walker (2 wheeled) Transfers: Sit to/from Stand Sit to Stand: Supervision         General transfer comment: supervision for safety, compliant with sternal precautions  Ambulation/Gait Ambulation/Gait assistance: Supervision Ambulation Distance (Feet): 350 Feet Assistive device: Rolling walker (2 wheeled);None Gait Pattern/deviations: Step-through pattern;Decreased step length - right;Decreased step length - left;Decreased stride length;Trunk flexed Gait velocity: decreased Gait velocity interpretation: Below normal speed for age/gender General Gait Details: initially amb for 42' with RW with verbal cues for upright posture, pt able to amb last 300' without AD with improved upright posture but  slightly decreased speed, pt with no episodes of LOB   Stairs Stairs: Yes   Stair Management: No rails;Step to pattern;Forwards Number of Stairs: 3 General stair comments: supervision for safety  Wheelchair Mobility    Modified Rankin (Stroke Patients Only)       Balance Overall balance assessment: Modified Independent Sitting-balance support: Feet supported;No upper extremity supported Sitting balance-Leahy Scale: Good Sitting balance - Comments: sat EOB x 2 min   Standing balance support: No upper extremity supported Standing balance-Leahy Scale: Good Standing balance comment: able to stand without UE support during adjustment of RW and amb without AD                    Cognition Arousal/Alertness: Awake/alert Behavior During Therapy: WFL for tasks assessed/performed Overall Cognitive Status: Within Functional Limits for tasks assessed                      Exercises      General Comments        Pertinent Vitals/Pain Pain Assessment: No/denies pain    Home Living                      Prior Function            PT Goals (current goals can now be found in the care plan section) Acute Rehab PT Goals Patient Stated Goal: go home PT Goal Formulation: All assessment and education complete, DC therapy Progress towards PT goals: Progressing toward goals;Goals met/education completed, patient discharged from PT    Frequency    Other (Comment) (pt d/c'd from acute PT services)      PT Plan Discharge plan needs to be updated;Frequency needs to  be updated    Co-evaluation             End of Session Equipment Utilized During Treatment: Gait belt Activity Tolerance: Patient tolerated treatment well Patient left: in chair;with call bell/phone within reach Nurse Communication: Mobility status PT Visit Diagnosis: Other abnormalities of gait and mobility (R26.89);Muscle weakness (generalized) (M62.81);Difficulty in walking, not  elsewhere classified (R26.2)     Time: 3870-6582 PT Time Calculation (min) (ACUTE ONLY): 27 min  Charges:  $Gait Training: 23-37 mins                    G Codes:       Tracie Harrier Jun 10, 2016, 9:27 AM   Tracie Harrier, SPT Acute Rehab SPT [phone]

## 2016-06-06 NOTE — Discharge Summary (Signed)
Physician Discharge Summary  Patient ID: Christian Black MRN: [id number] DOB/AGE: 1946/04/02 71 y.o.  Admit date: 05/30/2016 Discharge date: 06/07/2016  Admission Diagnoses:  Patient Active Problem List   Diagnosis Date Noted  . Malnutrition of moderate degree 06/04/2016  . Coronary artery disease 06/01/2016  . CAD (coronary artery disease) 05/31/2016  . CAD (coronary artery disease), native coronary artery 05/30/2016  . Abnormal stress test   . Coronary artery disease involving native coronary artery of native heart without angina pectoris   . Essential hypertension 05/23/2016  . Tobacco abuse 05/23/2016  . History of gout 11/01/2015  . COPD (chronic obstructive pulmonary disease) with emphysema ([place]) 11/01/2015  . Coronary atherosclerosis 11/01/2015  . Colon cancer, ascending ([place]) 05/27/2015  . Mass of colon s/p lap assisated right colon resection 05/27/15 05/20/2015   Discharge Diagnoses:   Patient Active Problem List   Diagnosis Date Noted  . 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 Malnutrition of moderate degree 06/04/2016  . Coronary artery disease 06/01/2016  . CAD (coronary artery disease) 05/31/2016  . CAD (coronary artery disease), native coronary artery 05/30/2016  . Abnormal stress test   . Coronary artery disease involving native coronary artery of native heart without angina pectoris   . Essential hypertension 05/23/2016  . Tobacco abuse 05/23/2016  . History of gout 11/01/2015  . COPD (chronic obstructive pulmonary disease) with emphysema ([place]) 11/01/2015  . Coronary atherosclerosis 11/01/2015  . Colon cancer, ascending ([place]) 05/27/2015  . Mass of colon s/p lap assisated right colon resection 05/27/15 05/20/2015   Discharged Condition: good  History of Present Illness:   Christian Black is a 71 yo male with known history of tobacco abuse, COPD, and H/O Colon cancer s/p resection.  He was evaluated for a routine visit by Dr. Quay Burow on 05/03/2016.  EKG obtained at that time showed  LVH with possible ischemia.  Further workup with stress ECHO was obtained on 05/10/2016 and showed an EF of 60%, but there was also hypokinesis of the apical anterior, mid anteroseptal, inferior, and apical myocardium.  Due to these findings cardiac catheterization was performed and showed severe multivessel CAD.  It was felt coronary bypass grafting would be indicated and TCTS consult was requested.  He was evaluated by Dr. Servando Snare who was in agreement the patient would benefit from bypass surgery.  The patient states he has never experienced symptoms of chest pain or indigestion, shortness of breath, or lower extremity edema.  The risks and benefits of the procedure were explained to the patient and he was agreeable to proceed.    Hospital Course:   Christian Black was taken to the operating room and underwent CABG x 5 utilizing LIMA to LAD, SVG to Diagonal, Sequential SVG to Distal circumflex and OM, and a SVG to RCA.  He also underwent endoscopic harvest of greater saphenous vein from right leg.  He tolerated the procedure without difficulty and was taken to the SICU in stable condition.  He was extubated the evening of surgery.  During his stay in the SICU the patient remained hemodynamically stable off pressor support.  His chest tubes and arterial lines were removed without difficulty.  The patient became dizzy and vomited with ambulation.  There was concern there was aspiration.  A culture was obtained and he was started empirically on Fortaz.  He developed Atrial Flutter and was treated with IV Amiodarone.  He successfully converted to NSR.  He participated with PT and was able to ambulate with minimal difficulty.  He was medically stable for transfer to the step down unit on 06/05/2016.  The patient continues to make progress.  His respiratory culture revealed normal respiratory flora and his Tressie Ellis was discontinued.  He continues to maintain NSR.  His pacing wires were removed without difficulty.  He  continues to ambulate independently.  He has been weaned off oxygen.  His pain is well controlled.  He is medically stable for discharge home today.         Significant Diagnostic Studies: angiography:    Ost RCA to Mid RCA lesion, 20 %stenosed.  Mid RCA lesion, 70 %stenosed.  Ost Cx to Prox Cx lesion, 80 %stenosed.  Prox Cx lesion, 40 %stenosed.  Ost LM to LM lesion, 80 %stenosed.  Ost LAD to Prox LAD lesion, 80 %stenosed.  2nd Mrg lesion, 50 %stenosed.   1. 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 Heavy calcifiation of the left main, proximal LAD, proximal Circumflex and proximal/mid RAC 2. Severe stenosis distal left main 3. Severe stenosis proximal LAD 4. Severe stenosis proximal Circumflex 5. Severe stenosis mid to distal RCA  Treatments: surgery:    Coronary artery bypass grafting x5 with the left internal mammary to the left anterior descending coronary artery, reverse saphenous vein graft to the diagonal coronary artery, sequential reverse saphenous vein graft to the first obtuse marginal and distal circumflex, and reverse saphenous vein graft to the distal right coronary artery with right leg thigh and calf, greater saphenous vein harvesting endoscopically.  Disposition: 01-Home or Self Care   Discharge Medications:  Allergies as of 06/07/2016      Reactions   No Known Allergies       Medication List    STOP taking these medications   amLODipine 5 MG tablet Commonly known as:  NORVASC   chlorthalidone 25 MG tablet Commonly known as:  HYGROTON     TAKE these medications   amiodarone 200 MG tablet Commonly known as:  PACERONE Take 1 tablet (200 mg total) by mouth every 12 (twelve) hours. For 2 days then take Amiodarone 200 mg by mouth daily thereafter   aspirin EC 81 MG tablet Take 81 mg by mouth daily.   atorvastatin 80 MG tablet Commonly known as:  LIPITOR Take 1 tablet (80 mg total) by mouth daily at 6 PM.   guaiFENesin 600 MG 12 hr  tablet Commonly known as:  MUCINEX Take 1 tablet (600 mg total) by mouth 2 (two) times daily as needed for cough or to loosen phlegm.   metoprolol tartrate 25 MG tablet Commonly known as:  LOPRESSOR Take 0.5 tablets (12.5 mg total) by mouth 2 (two) times daily.   traMADol 50 MG tablet Commonly known as:  ULTRAM Take 50 mg by mouth every 4-6 hours PRN pain.      The patient has been discharged on:   1.Beta Blocker:  Yes [ x  ]                              No   [   ]                              If No, reason:  2.Ace Inhibitor/ARB: Yes [   ]  No  [ x   ]                                     If No, reason: labile BP  3.Statin:   Yes [ x  ]                  No  [   ]                  If No, reason:  4.Ecasa:  Yes  [ x  ]                  No   [   ]                  If No, reason:    Follow-up Information    [name], MD Follow up on 07/05/2016.   Specialty:  Cardiothoracic Surgery Why:  Appointment is at 11:00, please get CXR at 10:30 am Contact information: [place] [address] [phone]        Skeet Latch, MD Follow up on 06/21/2016.   Specialty:  Cardiology Why:  Appointment is at 10:15 Contact information: [place] [address] [phone]           Signed: Nani Skillern 06/07/2016, 8:00 AM

## 2016-06-06 NOTE — Discharge Instructions (Signed)
Coronary Artery Bypass Grafting, Care After ° °This sheet gives you information about how to care for yourself after your procedure. Your health care provider may also give you more specific instructions. If you have problems or questions, contact your health care provider. °What can I expect after the procedure? °After the procedure, it is common to have: °· Nausea and a lack of appetite. °· Constipation. °· Weakness and fatigue. °· Depression or irritability. °· Pain or discomfort in your incision areas. °Follow these instructions at home: °Medicines  °· Take over-the-counter and prescription medicines only as told by your health care provider. Do not stop taking medicines or start any new medicines without approval from your health care provider. °· If you were prescribed an antibiotic medicine, take it as told by your health care provider. Do not stop taking the antibiotic even if you start to feel better. °· Do not drive or use heavy machinery while taking prescription pain medicine. °Incision care  °· Follow instructions from your health care provider about how to take care of your incisions. Make sure you: °¨ Wash your hands with soap and water before you change your bandage (dressing). If soap and water are not available, use hand sanitizer. °¨ Change your dressing as told by your health care provider. °¨ Leave stitches (sutures), skin glue, or adhesive strips in place. These skin closures may need to stay in place for 2 weeks or longer. If adhesive strip edges start to loosen and curl up, you may trim the loose edges. Do not remove adhesive strips completely unless your health care provider tells you to do that. °· Keep incision areas clean, dry, and protected. °· Check your incision areas every day for signs of infection. Check for: °¨ More redness, swelling, or pain. °¨ More fluid or blood. °¨ Warmth. °¨ Pus or a bad smell. °· If incisions were made in your legs: °¨ Avoid crossing your legs. °¨ Avoid  sitting for long periods of time. Change positions every 30 minutes. °¨ Raise (elevate) your legs when you are sitting. °Bathing  °· Do not take baths, swim, or use a hot tub until your health care provider approves. °· Only take sponge baths. Pat the incisions dry. Do not rub incisions with a washcloth or towel. °· Ask your health care provider when you can shower. °Eating and drinking  °· Eat foods that are high in fiber, such as raw fruits and vegetables, whole grains, beans, and nuts. Meats should be lean cut. Avoid canned, processed, and fried foods. This can help prevent constipation and is a recommended part of a heart-healthy diet. °· Drink enough fluid to keep your urine clear or pale yellow. °· Limit alcohol intake to no more than 1 drink a day for nonpregnant women and 2 drinks a day for men. One drink equals 12 oz of beer, 5 oz of wine, or 1½ oz of hard liquor. °Activity  °· Rest and limit your activity as told by your health care provider. You may be instructed to: °¨ Stop any activity right away if you have chest pain, shortness of breath, irregular heartbeats, or dizziness. Get help right away if you have any of these symptoms. °¨ Move around frequently for short periods or take short walks as directed by your health care provider. Gradually increase your activities. You may need physical therapy or cardiac rehabilitation to help strengthen your muscles and build your endurance. °¨ Avoid lifting, pushing, or pulling anything that is heavier than 10   4.5 kg) for at least 6 weeks or as told by your health care provider.  Do not drive until your health care provider approves.  Ask your health care provider when you may return to work.  Ask your health care provider when you may resume sexual activity. General instructions   Do not use any products that contain nicotine or tobacco, such as cigarettes and e-cigarettes. If you need help quitting, ask your health care provider.  Take 2-3 deep  breaths every few hours during the day, while you recover. This helps expand your lungs and prevent complications like pneumonia after surgery.  If you were given a device called an incentive spirometer, use it several times a day to practice deep breathing. Support your chest with a pillow or your arms when you take deep breaths or cough.  Wear compression stockings as told by your health care provider. These stockings help to prevent blood clots and reduce swelling in your legs.  Weigh yourself every day. This helps identify if your body is holding (retaining) fluid that may make your heart and lungs work harder.  Keep all follow-up visits as told by your health care provider. This is important. Contact a health care provider if:  You have more redness, swelling, or pain around any incision.  You have more fluid or blood coming from any incision.  Any incision feels warm to the touch.  You have pus or a bad smell coming from any incision  You have a fever.  You have swelling in your ankles or legs.  You have pain in your legs.  You gain 2 lb (0.9 kg) or more a day.  You are nauseous or you vomit.  You have diarrhea. Get help right away if:  You have chest pain that spreads to your jaw or arms.  You are short of breath.  You have a fast or irregular heartbeat.  You notice a "clicking" in your breastbone (sternum) when you move.  You have numbness or weakness in your arms or legs.  You feel dizzy or light-headed. Summary  After the procedure, it is common to have pain or discomfort in the incision areas.  Do not take baths, swim, or use a hot tub until your health care provider approves.  Gradually increase your activities. You may need physical therapy or cardiac rehabilitation to help strengthen your muscles and build your endurance.  Weigh yourself every day. This helps identify if your body is holding (retaining) fluid that may make your heart and lungs work  harder. This information is not intended to replace advice given to you by your health care provider. Make sure you discuss any questions you have with your health care provider. Document Released: 10/06/2004 Document Revised: 02/06/2016 Document Reviewed: 02/06/2016 Elsevier Interactive Patient Education  2017 [employer].    Endoscopic Saphenous Vein Harvesting, Care After Refer to this sheet in the next few weeks. These instructions provide you with information about caring for yourself after your procedure. Your health care provider may also give you more specific instructions. Your treatment has been planned according to current medical practices, but problems sometimes occur. Call your health care provider if you have any problems or questions after your procedure. What can I expect after the procedure? After the procedure, it is common to have:  Pain.  Bruising.  Swelling.  Numbness. Follow these instructions at home: Medicine   Take over-the-counter and prescription medicines only as told by your health care provider.  Do  not drive or operate heavy machinery while taking prescription pain medicine. Incision care    Follow instructions from your health care provider about how to take care of the cut made during surgery (incision). Make sure you:  Wash your hands with soap and water before you change your bandage (dressing). If soap and water are not available, use hand sanitizer.  Change your dressing as told by your health care provider.  Leave stitches (sutures), skin glue, or adhesive strips in place. These skin closures may need to be in place for 2 weeks or longer. If adhesive strip edges start to loosen and curl up, you may trim the loose edges. Do not remove adhesive strips completely unless your health care provider tells you to do that.  Check your incision area every day for signs of infection. Check for:  More redness, swelling, or pain.  More fluid or  blood.  Warmth.  Pus or a bad smell. General instructions   Raise (elevate) your legs above the level of your heart while you are sitting or lying down.  Do any exercises your health care providers have given you. These may include deep breathing, coughing, and walking exercises.  Do not shower, take baths, swim, or use a hot tub unless told by your health care provider.  Wear your elastic stocking if told by your health care provider.  Keep all follow-up visits as told by your health care provider. This is important. Contact a health care provider if:  Medicine does not help your pain.  Your pain gets worse.  You have new leg bruises or your leg bruises get bigger.  You have a fever.  Your leg feels numb.  You have more redness, swelling, or pain around your incision.  You have more fluid or blood coming from your incision.  Your incision feels warm to the touch.  You have pus or a bad smell coming from your incision. Get help right away if:  Your pain is severe.  You develop pain, tenderness, warmth, redness, or swelling in any part of your leg.  You have chest pain.  You have trouble breathing. This information is not intended to replace advice given to you by your health care provider. Make sure you discuss any questions you have with your health care provider. Document Released: 11/29/2010 Document Revised: 08/25/2015 Document Reviewed: 01/31/2015 Elsevier Interactive Patient Education  2017 [employer].

## 2016-06-06 NOTE — Care Management Important Message (Signed)
Important Message  Patient Details  Name: BODEY FRIZELL MRN: [id number] Date of Birth: 1945-06-03   Medicare Important Message Given:  Yes    Katelin Kutsch 06/06/2016, 1:30 PM

## 2016-06-06 NOTE — Progress Notes (Signed)
CVP not changed at this time due to insufficient equipment. RT will pass along that CVP is due to be changed to day shift

## 2016-06-06 NOTE — Progress Notes (Signed)
Remover wires at 1420 with no resistance. VS q15 on bed rest

## 2016-06-07 LAB — GLUCOSE, CAPILLARY: Glucose-Capillary: 121 mg/dL — ABNORMAL HIGH (ref 65–99)

## 2016-06-07 MED ORDER — ATORVASTATIN CALCIUM 80 MG PO TABS: 80.0000 mg | ORAL_TABLET | Freq: Every day | ORAL | 1 refills | Status: DC

## 2016-06-07 MED ORDER — AMIODARONE HCL 200 MG PO TABS: 200.0000 mg | ORAL_TABLET | Freq: Two times a day (BID) | ORAL | 1 refills | Status: DC

## 2016-06-07 MED ORDER — METOPROLOL TARTRATE 25 MG PO TABS: 12.5000 mg | ORAL_TABLET | Freq: Two times a day (BID) | ORAL | 1 refills | Status: DC

## 2016-06-07 MED ORDER — GUAIFENESIN ER 600 MG PO TB12: 600.0000 mg | ORAL_TABLET | Freq: Two times a day (BID) | ORAL | Status: DC | PRN

## 2016-06-07 MED ORDER — TRAMADOL HCL 50 MG PO TABS: ORAL_TABLET | ORAL | 0 refills | Status: DC

## 2016-06-07 NOTE — Progress Notes (Signed)
Pt. Discharged to home with [facility] Pt. D/C'd via  wheelchair with Discharge information reviewed and given All personal belongings given to Pt.   Education discussed with teach back PICC Removed by IV team Tele d/c

## 2016-06-07 NOTE — Progress Notes (Addendum)
Ed completed with pt and ex-wife. Voiced understanding. Pt sts he is done smoking. Gave materials. Will refer to [place]. Ex-wife sts the family takes him where he needs to go (doesn't drive).  [place], ACSM 1:33 PM  06/07/2016

## 2016-06-07 NOTE — Progress Notes (Addendum)
      [address]       [place] [address]             [phone]        6 Days Post-Op Procedure(s) (LRB): CORONARY ARTERY BYPASS GRAFTING (CABG) x 5 using the left internal mammary artery as well as the right greater saphenous vein harvested endoscopically. LIMA to LAD, SVG to DIAGONAL, SVG SEQUENTIALLY to DISTAL CIRCUMFLEX and OM, SVG to RCA (N/A) TRANSESOPHAGEAL ECHOCARDIOGRAM (TEE) (N/A)  Subjective: Patient without specific complaints this am and he wants to go home.  Objective: Vital signs in last 24 hours: Temp:  [98 F (36.7 C)-99.1 F (37.3 C)] 99.1 F (37.3 C) (03/08 0447) Pulse Rate:  [73-83] 77 (03/08 0447) Cardiac Rhythm: Normal sinus rhythm (03/07 1940) Resp:  [18] 18 (03/08 0447) BP: (117-131)/(63-68) 120/67 (03/08 0447) SpO2:  [97 %-100 %] 97 % (03/08 0447)  Pre op weight 77.1 kg Current Weight  06/06/16 162 lb 4.1 oz (73.6 kg)      Intake/Output from previous day: No intake/output data recorded.   Physical Exam:  Cardiovascular: RRR Pulmonary: Clear to auscultation bilaterally Abdomen: Soft, non tender, bowel sounds present. Extremities: No lower extremity edema. Wounds: Clean and dry.  No erythema or signs of infection.  Lab Results: CBC:  Recent Labs  06/05/16 0336 06/06/16 0405  WBC 6.1 5.7  HGB 10.2* 9.7*  HCT 30.6* 29.6*  PLT 156 208   BMET:   Recent Labs  06/05/16 0336 06/06/16 0405  NA 133* 135  K 3.1* 3.3*  CL 92* 96*  CO2 32 31  GLUCOSE 179* 97  BUN 16 17  CREATININE 0.92 0.87  CALCIUM 8.4* 8.6*    PT/INR:  Lab Results  Component Value Date   INR 1.42 06/01/2016   INR 1.05 05/31/2016   INR 1.1 05/23/2016   ABG:  INR: Will add last result for INR, ABG once components are confirmed Will add last 4 CBG results once components are confirmed  Assessment/Plan:  1. CV - Post op a fib. Maintaining SR in the 70's this am. On Amiodarone 200 mg bid and Lopressor 12.5 mg bid. 2.  Pulmonary - On  room air. Mucinex for cough. Encourage incentive spirometer. 3.  Acute blood loss anemia - Last H and H slightly decreased to 9.7 and 29.6 4. Patient now agreeable for home PT so will order. Discussed importance of walking daily and increasing frequency and duration as tolerates. 5. Likely discharge  ZIMMERMAN,DONIELLE MPA-C 06/07/2016,7:19 AM   Wounds healing well, pateint wants to go home today, will have pt see at home I have seen and examined Fleet Contras and agree with the above assessment  and plan.  [name] MD [facility] [phone] Office [phone] 06/07/2016 8:39 AM

## 2016-06-08 ENCOUNTER — Telehealth (HOSPITAL_COMMUNITY): Payer: Self-pay | Admitting: Internal Medicine

## 2016-06-08 NOTE — Telephone Encounter (Signed)
Verified [employer] Medicare insurance benefits through [employer]. Co-Pay $20.00, No Co-Insurance, No Deductible Out of Pocket $6700.00, pt has met $193.85, pt's responsibility [id number] Reference [phone]..... KJ

## 2016-06-12 ENCOUNTER — Telehealth: Payer: Self-pay | Admitting: Internal Medicine

## 2016-06-12 NOTE — Telephone Encounter (Signed)
Shanon Brow, PT from [facility], called to inform Dr. Quay Burow that Mr. Menken decline services from them. Pt just had a bypass on his artery and seem to be doing good. Shanon Brow review restriction and diet with the pt and he seem to know about it. FYI, they will not be going to follow up with him anymore.

## 2016-06-12 NOTE — Telephone Encounter (Signed)
noted 

## 2016-06-19 ENCOUNTER — Other Ambulatory Visit (HOSPITAL_COMMUNITY): Payer: Self-pay | Admitting: Physician Assistant

## 2016-06-20 ENCOUNTER — Other Ambulatory Visit: Payer: Self-pay | Admitting: Physician Assistant

## 2016-06-20 DIAGNOSIS — G8918 Other acute postprocedural pain: Secondary | ICD-10-CM

## 2016-06-20 NOTE — Telephone Encounter (Signed)
RX refill of Tramadol 50 mg Faxed to [facility]

## 2016-06-21 ENCOUNTER — Ambulatory Visit (INDEPENDENT_AMBULATORY_CARE_PROVIDER_SITE_OTHER): Payer: Medicare Other | Admitting: Cardiovascular Disease

## 2016-06-21 ENCOUNTER — Encounter: Payer: Self-pay | Admitting: Cardiovascular Disease

## 2016-06-21 VITALS — BP 124/70 | HR 55 | Ht 76.0 in | Wt 165.0 lb

## 2016-06-21 DIAGNOSIS — I480 Paroxysmal atrial fibrillation: Secondary | ICD-10-CM

## 2016-06-21 DIAGNOSIS — Z951 Presence of aortocoronary bypass graft: Secondary | ICD-10-CM

## 2016-06-21 DIAGNOSIS — Z13220 Encounter for screening for lipoid disorders: Secondary | ICD-10-CM | POA: Diagnosis not present

## 2016-06-21 DIAGNOSIS — I10 Essential (primary) hypertension: Secondary | ICD-10-CM | POA: Diagnosis not present

## 2016-06-21 DIAGNOSIS — Z5181 Encounter for therapeutic drug level monitoring: Secondary | ICD-10-CM | POA: Diagnosis not present

## 2016-06-21 MED ORDER — METOPROLOL SUCCINATE ER 25 MG PO TB24: 25.0000 mg | ORAL_TABLET | Freq: Every day | ORAL | 3 refills | Status: DC

## 2016-06-21 NOTE — Patient Instructions (Signed)
Medication Instructions:  CHANGE YOUR METOPROLOL TO SUCC 25 MG ONCE DAILY   Labwork: FASTING LP/CMET AT [facility] WHEN YOU GO   Testing/Procedures: NONE  Follow-Up: Your physician wants you to follow-up in: [place] will receive a reminder letter in the mail two months in advance. If you don't receive a letter, please call our office to schedule the follow-up appointment.  If you need a refill on your cardiac medications before your next appointment, please call your pharmacy.
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
## 2016-06-22 ENCOUNTER — Telehealth: Payer: Self-pay | Admitting: Cardiovascular Disease

## 2016-06-22 NOTE — Telephone Encounter (Signed)
New message     Per daughter pt just had a 5 bipass surgery done and they want to know if the patient can use an electric blanket

## 2016-06-22 NOTE — Telephone Encounter (Signed)
Returned call-advised ok to use blanket.

## 2016-06-26 ENCOUNTER — Other Ambulatory Visit: Payer: Self-pay | Admitting: Cardiothoracic Surgery

## 2016-06-26 ENCOUNTER — Telehealth: Payer: Self-pay | Admitting: Hematology

## 2016-06-26 DIAGNOSIS — Z951 Presence of aortocoronary bypass graft: Secondary | ICD-10-CM
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
## 2016-06-26 NOTE — Telephone Encounter (Signed)
Pt daughter called to r/s pt missed appt due to being in hospital. Gave new appt date/time 3/29 at 0830 per request

## 2016-06-28 ENCOUNTER — Ambulatory Visit: Payer: Medicare Other

## 2016-06-28 ENCOUNTER — Other Ambulatory Visit: Payer: Self-pay | Admitting: Cardiovascular Disease

## 2016-06-28 ENCOUNTER — Encounter: Payer: Self-pay | Admitting: Hematology

## 2016-06-28 ENCOUNTER — Ambulatory Visit (HOSPITAL_BASED_OUTPATIENT_CLINIC_OR_DEPARTMENT_OTHER): Payer: Medicare Other | Admitting: Hematology

## 2016-06-28 ENCOUNTER — Telehealth: Payer: Self-pay | Admitting: Hematology

## 2016-06-28 VITALS — BP 119/65 | HR 64 | Temp 98.3°F | Resp 18 | Ht 76.0 in | Wt 164.1 lb

## 2016-06-28 DIAGNOSIS — C182 Malignant neoplasm of ascending colon: Secondary | ICD-10-CM

## 2016-06-28 DIAGNOSIS — Z5181 Encounter for therapeutic drug level monitoring: Secondary | ICD-10-CM | POA: Diagnosis not present

## 2016-06-28 DIAGNOSIS — I10 Essential (primary) hypertension: Secondary | ICD-10-CM | POA: Diagnosis not present

## 2016-06-28 DIAGNOSIS — Z13220 Encounter for screening for lipoid disorders: Secondary | ICD-10-CM | POA: Diagnosis not present

## 2016-06-28 NOTE — Telephone Encounter (Signed)
Gave patient AVS and calender per 06/28/2016 los.  

## 2016-07-02 LAB — COMPREHENSIVE METABOLIC PANEL
ALT: 16 IU/L (ref 0–44)
AST: 21 IU/L (ref 0–40)
Albumin/Globulin Ratio: 1.1 — ABNORMAL LOW (ref 1.2–2.2)
Albumin: 3.6 g/dL (ref 3.6–4.8)
Alkaline Phosphatase: 103 IU/L (ref 39–117)
BUN/Creatinine Ratio: 14 (ref 10–24)
BUN: 16 mg/dL (ref 8–27)
Bilirubin Total: 0.3 mg/dL (ref 0.0–1.2)
CO2: 24 mmol/L (ref 18–29)
CREATININE: 1.11 mg/dL (ref 0.76–1.27)
Calcium: 8.5 mg/dL — ABNORMAL LOW (ref 8.6–10.2)
Chloride: 99 mmol/L (ref 96–106)
GFR calc Af Amer: 78 mL/min/{1.73_m2} (ref >59)
GFR calc non Af Amer: 68 mL/min/{1.73_m2} (ref >59)
GLUCOSE: 101 mg/dL — AB (ref 65–99)
Globulin, Total: 3.3 g/dL (ref 1.5–4.5)
Potassium: 4.8 mmol/L (ref 3.5–5.2)
Sodium: 137 mmol/L (ref 134–144)
Total Protein: 6.9 g/dL (ref 6.0–8.5)

## 2016-07-02 LAB — LIPID PANEL W/O CHOL/HDL RATIO
Cholesterol, Total: 124 mg/dL (ref 100–199)
HDL: 66 mg/dL (ref >39)
LDL CALC: 51 mg/dL (ref 0–99)
TRIGLYCERIDES: 33 mg/dL (ref 0–149)
VLDL CHOLESTEROL CAL: 7 mg/dL (ref 5–40)

## 2016-07-02 LAB — SPECIMEN STATUS REPORT

## 2016-07-02 LAB — AMBIG ABBREV CMP14 DEFAULT

## 2016-07-02 LAB — AMBIG ABBREV LP DEFAULT

## 2016-07-05 ENCOUNTER — Ambulatory Visit
Admission: RE | Admit: 2016-07-05 | Discharge: 2016-07-05 | Disposition: A | Discharge status: Home / Self Care | Payer: Medicare Other | Source: Ambulatory Visit | Attending: Cardiothoracic Surgery | Admitting: Cardiothoracic Surgery

## 2016-07-05 ENCOUNTER — Encounter: Payer: Self-pay | Admitting: Cardiothoracic Surgery

## 2016-07-05 ENCOUNTER — Ambulatory Visit (INDEPENDENT_AMBULATORY_CARE_PROVIDER_SITE_OTHER): Payer: Self-pay | Admitting: Cardiothoracic Surgery

## 2016-07-05 VITALS — BP 129/79 | HR 67 | Resp 20 | Ht 76.0 in | Wt 164.0 lb

## 2016-07-05 DIAGNOSIS — Z951 Presence of aortocoronary bypass graft: Secondary | ICD-10-CM

## 2016-07-05 DIAGNOSIS — Z452 Encounter for adjustment and management of vascular access device: Secondary | ICD-10-CM | POA: Diagnosis not present

## 2016-07-05 NOTE — Progress Notes (Signed)
      [address]       [place] [address]             [phone]      Christian Black is a 71 y.o. male patient who underwent a CABG 5 on 06/01/2016. He returns to the office today for his 1 month follow-up appointment.   1. S/P CABG x 5    Past Medical History:  Diagnosis Date  . Colon cancer ([place])    colon mass  . Essential hypertension 05/23/2016  . Medical history non-contributory   . Tobacco abuse 05/23/2016   No past surgical history pertinent negatives on file. Scheduled Meds: Continuous Infusions: PRN Meds:  Allergies  Allergen Reactions  . No Known Allergies    Active Problems:   * No active hospital problems. *  Blood pressure 129/79, pulse 67, resp. rate 20, height 6\' 4"  (1.93 m), weight 74.4 kg (164 lb), SpO2 98 %.   CLINICAL DATA:  CABG  EXAM: CHEST  2 VIEW  COMPARISON:  06/06/2016 chest radiograph.  FINDINGS: Intact median sternotomy wires. Stable cardiomediastinal silhouette with normal heart size and aortic atherosclerosis. No pneumothorax. No pleural effusion. Emphysema. No pulmonary edema. Mild scarring at the left lung base. No acute consolidative airspace disease. Healed deformities in the lateral left mid ribs.  IMPRESSION: 1. No acute cardiopulmonary disease. Emphysema. Mild left lung base scarring. 2. Aortic atherosclerosis.   Electronically Signed   By: Ilona Sorrel M.D.   On: 07/05/2016 10:47   Subjective Objective Assessment & Plan  Christian Black 07/05/2016

## 2016-07-05 NOTE — Progress Notes (Signed)
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 SURGICAL PROCEDURE:  Coronary artery bypass grafting x5 with the left internal mammary to the left anterior descending coronary artery, reverse saphenous vein graft to the diagonal coronary artery, sequential reverse saphenous vein graft to the first obtuse marginal and distal circumflex, and reverse saphenous vein graft to the distal right coronary artery with right leg thigh and calf, greater saphenous vein harvesting endoscopically. SURGEON:  Lanelle Bal, MD.   History of Present Illness:     Patient returns to the office stating follow-up after recent coronary artery bypass graft. Since discharge he has slowly been improving increased his physical activity. He's had no recurrent chest pain or evidence of congestive heart failure. He is no longer smoking. His postoperative course was complicated by postoperative atrial fibrillation, he's had no recurrence.     Past Medical History:  Diagnosis Date  . Colon cancer ([place])    colon mass  . Essential hypertension 05/23/2016  . Medical history non-contributory   . Tobacco abuse 05/23/2016     History  Smoking Status  . Former Smoker  . Packs/day: 0.50  . Years: 30.00  . Types: Cigarettes  Smokeless Tobacco  . Never Used    Comment: 1 ppd every 3 days now (07/05/2015)    History  Alcohol Use  . 0.6 - 1.2 oz/week  . 1 - 2 Cans of beer per week    Comment: 1-2 can beer per day     Allergies  Allergen Reactions  . No  Known Allergies     Current Outpatient Prescriptions  Medication Sig Dispense Refill  . amiodarone (PACERONE) 200 MG tablet Take 1 tablet (200 mg total) by mouth every 12 (twelve) hours. For 2 days then take Amiodarone 200 mg by mouth daily thereafter 60 tablet 1  . aspirin EC 81 MG tablet Take 81 mg by mouth daily.    Marland Kitchen atorvastatin (LIPITOR) 80 MG tablet Take 1 tablet (80 mg total) by mouth daily at 6 PM. 30 tablet 1  . guaiFENesin (MUCINEX) 600 MG 12 hr tablet Take 1 tablet (600 mg total) by mouth 2 (two) times daily as needed for cough or to loosen phlegm.    . metoprolol succinate (TOPROL XL) 25 MG 24 hr tablet Take 1 tablet (25 mg total) by mouth daily. 90 tablet 3  . traMADol (ULTRAM) 50 MG tablet TAKE 1 TABLET BY MOUTH EVERY 4 TO 6 HOURS AS NEEDED FOR PAIN 30 tablet 0   No current facility-administered medications for this visit.        Physical Exam: BP 129/79   Pulse 67   Resp 20   Ht 6\' 4"  (1.93 m)   Wt 164 lb (74.4 kg)   SpO2 98% Comment: RA  BMI 19.96 kg/m   General appearance: alert, cooperative and no distress Neurologic: intact Heart: regular rate and rhythm, S1, S2 normal, no murmur, click, rub or gallop Lungs: clear to auscultation bilaterally Abdomen: soft, non-tender; bowel sounds normal;  no masses,  no organomegaly Extremities: extremities normal, atraumatic, no cyanosis or edema and Homans sign is negative, no sign of DVT Wound: sternum stable wounds healing   Diagnostic Studies & Laboratory data:     Recent Radiology Findings:   Dg Chest 2 View  Result Date: 07/05/2016 CLINICAL DATA:  CABG EXAM: CHEST  2 VIEW COMPARISON:  06/06/2016 chest radiograph. FINDINGS: Intact median sternotomy wires. Stable cardiomediastinal silhouette with normal heart size and aortic atherosclerosis. No pneumothorax. No pleural effusion. Emphysema. No pulmonary edema. Mild scarring at the left lung base. No acute consolidative airspace disease. Healed deformities in the  lateral left mid ribs. IMPRESSION: 1. No acute cardiopulmonary disease. Emphysema. Mild left lung base scarring. 2. Aortic atherosclerosis. Electronically Signed   By: Ilona Sorrel M.D.   On: 07/05/2016 10:47      Recent Lab Findings: Lab Results  Component Value Date   WBC 5.7 06/06/2016   HGB 9.7 (L) 06/06/2016   HCT 29.6 (L) 06/06/2016   PLT 208 06/06/2016   GLUCOSE 101 (H) 06/28/2016   CHOL 124 06/28/2016   TRIG 33 06/28/2016   HDL 66 06/28/2016   LDLCALC 51 06/28/2016   ALT 16 06/28/2016   AST 21 06/28/2016   NA 137 06/28/2016   K 4.8 06/28/2016   CL 99 06/28/2016   CREATININE 1.11 06/28/2016   BUN 16 06/28/2016   CO2 24 06/28/2016   TSH 1.37 11/01/2015   INR 1.42 06/01/2016   HGBA1C 5.6 05/31/2016      Assessment / Plan:      Stable after recent coronary artery bypass grafting- I've encouraged patient to enroll in cardiac rehabilitation He'll discontinue his amiodarone at the end of the current prescription Plan to see him back in 2 months.      [name] MD      [address] [place] [address] Office [phone]   Beeper [phone]  07/05/2016 11:39 AM

## 2016-07-05 NOTE — Patient Instructions (Signed)
Stop Amiodarone with current prescription         [address]       [place] [address]             [phone]       Coronary Artery Bypass Grafting  Care After  Refer to this sheet in the next few weeks. These instructions provide you with information on caring for yourself after your procedure. Your caregiver may also give you more specific instructions. Your treatment has been planned according to current medical practices, but problems sometimes occur. Call your caregiver if you have any problems or questions after your procedure.  Recovery from open heart surgery will be different for everyone. Some people feel well after 3 or 4 weeks, while for others it takes longer. After heart surgery, it may be normal to:  Not have an appetite, feel nauseated by the smell of food, or only want to eat a small amount.   Be constipated because of changes in your diet, activity, and medicines. Eat foods high in fiber. Add fresh fruits and vegetables to your diet. Stool softeners may be helpful.   Feel sad or unhappy. You may be frustrated or cranky. You may have good days and bad days. Do not give up. Talk to your caregiver if you do not feel better.   Feel weakness and fatigue. You many need physical therapy or cardiac rehabilitation to get your strength back.   Develop an irregular heartbeat called atrial fibrillation. Symptoms of atrial fibrillation are a fast, irregular heartbeat or feelings of fluttery heartbeats, shortness of breath, low blood pressure, and dizziness. If these symptoms develop, see your caregiver right away.  MEDICATION  Have a list of all the medicines you will be taking when you leave the hospital. For every medicine, know the following:   Name.   Exact dose.   Time of day to be taken.   How often it should be taken.   Why you are taking it.   Ask which medicines should or should not be taken together. If you take more than one heart  medicine, ask if it is okay to take them together. Some heart medicines should not be taken at the same time because they may lower your blood pressure too much.   Narcotic pain medicine can cause constipation. Eat fresh fruits and vegetables. Add fiber to your diet. Stool softener medicine may help relieve constipation.   Keep a copy of your medicines with you at all times.   Do not add or stop taking any medicine until you check with your caregiver.   Medicines can have side effects. Call your caregiver who prescribed the medicine if you:   Start throwing up, have diarrhea, or have stomach pain.   Feel dizzy or lightheaded when you stand up.   Feel your heart is skipping beats or is beating too fast or too slow.   Develop a rash.   Notice unusual bruising or bleeding.  HOME CARE INSTRUCTIONS  After heart surgery, it is important to learn how to take your pulse. Have your caregiver show you how to take your pulse.   Use your incentive spirometer. Ask your caregiver how long after surgery you need to use it.  Care of your chest incision  Tell your caregiver right away if you notice clicking in your chest (sternum).   Support your chest with a pillow or your arms when you take deep breaths and cough.   Follow  your caregiver's instructions about when you can bathe or swim.   Protect your incision from sunlight during the first year to keep the scar from getting dark.   Tell your caregiver if you notice:   Increased tenderness of your incision.   Increased redness or swelling around your incision.   Drainage or pus from your incision.  Care of your leg incision(s)  Avoid crossing your legs.   Avoid sitting for long periods of time. Change positions every half hour.   Elevate your leg(s) when you are sitting.   Check your leg(s) daily for swelling. Check the incisions for redness or drainage.   Diet is very important to heart health.   Eat plenty of fresh fruits and  vegetables. Meats should be lean cut. Avoid canned, processed, and fried foods.   Talk to a dietician. They can teach you how to make healthy food and drink choices.  Weight  Weigh yourself every day. This is important because it helps to know if you are retaining fluid that may make your heart and lungs work harder.   Use the same scale each time.   Weigh yourself every morning at the same time. You should do this after you go to the bathroom, but before you eat breakfast.   Your weight will be more accurate if you do not wear any clothes.   Record your weight.   Tell your caregiver if you have gained 2 pounds or more overnight.  Activity Stop any activity at once if you have chest pain, shortness of breath, irregular heartbeats, or dizziness. Get help right away if you have any of these symptoms.  Bathing.  Avoid soaking in a bath or hot tub until your incisions are healed.   Rest. You need a balance of rest and activity.   Exercise. Exercise per your caregiver's advice. You may need physical therapy or cardiac rehabilitation to help strengthen your muscles and build your endurance.   Climbing stairs. Unless your caregiver tells you not to climb stairs, go up stairs slowly and rest if you tire. Do not pull yourself up by the handrail.   Driving a car. Follow your caregiver's advice on when you may drive. You may ride as a passenger at any time. When traveling for long periods of time in a car, get out of the car and walk around for a few minutes every 2 hours.   Lifting. Avoid lifting, pushing, or pulling anything heavier than 10 pounds for 6 weeks after surgery or as told by your caregiver.   Returning to work. Check with your caregiver. People heal at different rates. Most people will be able to go back to work 6 to 12 weeks after surgery.   Sexual activity. You may resume sexual relations as told by your caregiver.  SEEK MEDICAL CARE IF:  Any of your incisions are red,  painful, or have any type of drainage coming from them.   You have an oral temperature above 101.5 F .   You have ankle or leg swelling.   You have pain in your legs.   You have weight gain of 2 or more pounds a day.   You feel dizzy or lightheaded when you stand up.  SEEK IMMEDIATE MEDICAL CARE IF:  You have angina or chest pain that goes to your jaw or arms. Call your local emergency services right away.   You have shortness of breath at rest or with activity.   You have a fast  or irregular heartbeat (arrhythmia).   There is a "clicking" in your sternum when you move.   You have numbness or weakness in your arms or legs.  MAKE SURE YOU:  Understand these instructions.   Will watch your condition.   Will get help right away if you are not doing well or get worse.    No lifting over 25 lbs for 3 months

## 2016-07-10 ENCOUNTER — Telehealth (HOSPITAL_COMMUNITY): Payer: Self-pay | Admitting: Internal Medicine

## 2016-07-10 NOTE — Telephone Encounter (Signed)
Per pt's ex-wife Duwan Adrian, pt has been scheduled for CRP and wants [place] to contact her for any changes in pt's schedule and also when nurse calls for Nurse Interview. Also she wants me to mail orientation homework to: [place], [place], [place] [address] attn: Ambrose Pancoast.... KJ

## 2016-07-10 NOTE — Telephone Encounter (Signed)
I called and spoke with patient daughter. I gave her contact information for Cardiac Rehab to give to her father. Patient daughter will give father information and return call.

## 2016-07-11 ENCOUNTER — Other Ambulatory Visit: Payer: Self-pay | Admitting: Physician Assistant

## 2016-07-11 ENCOUNTER — Other Ambulatory Visit: Payer: Self-pay

## 2016-07-11 MED ORDER — ATORVASTATIN CALCIUM 80 MG PO TABS: 80.0000 mg | ORAL_TABLET | Freq: Every day | ORAL | 3 refills | Status: DC

## 2016-07-24 ENCOUNTER — Telehealth (HOSPITAL_COMMUNITY): Payer: Self-pay | Admitting: Internal Medicine

## 2016-07-24 NOTE — Telephone Encounter (Signed)
Update: Verified [employer] through [employer],  Co-Pay $20, No Co-Insurance, No Deductible,  Out of Pocket $6700.00 Pt has met [id number] Reference # [id number].... KJ

## 2016-07-26 ENCOUNTER — Encounter (HOSPITAL_COMMUNITY)
Admission: RE | Admit: 2016-07-26 | Discharge: 2016-07-26 | Disposition: A | Discharge status: Home / Self Care | Payer: Medicare Other | Source: Ambulatory Visit | Attending: Cardiovascular Disease | Admitting: Cardiovascular Disease

## 2016-07-26 ENCOUNTER — Encounter (HOSPITAL_COMMUNITY): Payer: Self-pay

## 2016-07-26 VITALS — BP 125/79 | HR 58 | Ht 73.0 in | Wt 160.9 lb

## 2016-07-26 DIAGNOSIS — Z951 Presence of aortocoronary bypass graft: Secondary | ICD-10-CM | POA: Insufficient documentation

## 2016-07-26 NOTE — Progress Notes (Signed)
Cardiac Rehab Medication Review by a Pharmacist  Does the patient  feel that his/her medications are working for him/her?  Yes  Has the patient been experiencing any side effects to the medications prescribed?  No  Does the patient measure his/her own blood pressure or blood glucose at home?  Yes  Does the patient have any problems obtaining medications due to transportation or finances? No  Understanding of regimen: Good Understanding of indications: Good Potential of compliance: Good    Pharmacist comments: 71 y/o M with quiet demeanour presents to cardiac rehab in good spirits and no distress. Ambulating gingerly without assistance. No issues noted on med rec. Pt regularly checks blood pressure. Pt endorses compliance with all meds.   Angela Burke, PharmD, BCPS Pharmacy Resident Pager: [phone] 07/26/2016 8:41 AM

## 2016-07-26 NOTE — Progress Notes (Signed)
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  amiodarone (PACERONE) 200 MG tablet, Take 1 tablet (200 mg total) by mouth every 12 (twelve) hours. For 2 days then take Amiodarone 200 mg by mouth daily thereafter, Disp: 60 tablet, Rfl: 1 .  aspirin EC 81 MG tablet, Take 81 mg by mouth daily., Disp: , Rfl:  .  atorvastatin (LIPITOR) 80 MG tablet, Take 1 tablet (80 mg total) by mouth daily at 6 PM., Disp: 90 tablet, Rfl: 3 .  guaiFENesin (MUCINEX) 600 MG 12 hr tablet, Take 1 tablet (600 mg total) by mouth 2 (two) times daily as needed for cough or to loosen phlegm., Disp: , Rfl:  .  metoprolol succinate (TOPROL XL) 25 MG 24 hr tablet, Take 1 tablet (25 mg total) by mouth daily., Disp: 90 tablet, Rfl: 3 .  traMADol (ULTRAM) 50 MG tablet, TAKE 1 TABLET BY MOUTH EVERY 4 TO 6 HOURS AS NEEDED FOR PAIN, Disp: 30 tablet, Rfl: 0  Past Medical History: Past Medical History:  Diagnosis Date  . Colon cancer ([place])    colon mass  . Essential hypertension 05/23/2016  . Medical history non-contributory   . Tobacco abuse 05/23/2016    Tobacco Use: History  Smoking Status  . Former Smoker  . Packs/day: 0.50  . Years: 30.00  . Types: Cigarettes  . Quit date: 06/01/2016  Smokeless Tobacco  . Never Used    Labs: Recent Review Flowsheet Data    Labs for ITP Cardiac and Pulmonary Rehab Latest Ref Rng & Units 06/01/2016 06/01/2016 06/01/2016 06/02/2016 06/28/2016    Cholestrol 100 - 199 mg/dL - - - - 124   LDLCALC 0 - 99 mg/dL - - - - 51   HDL >39 mg/dL - - - - 66   Trlycerides 0 - 149 mg/dL - - - - 33   Hemoglobin A1c 4.8 - 5.6 % - - - - -   PHART 7.350 - 7.450 7.333(L) - 7.342(L) - -   PCO2ART 32.0 - 48.0 mmHg 47.3 - 49.6(H) - -   HCO3 20.0 - 28.0 mmol/L 25.2 - 26.8 - -   TCO2 0 - 100 mmol/L 27 27 28 31  -   ACIDBASEDEF 0.0 - 2.0 mmol/L 1.0 - - - -   O2SAT % 97.0 - 98.0 - -      Capillary Blood Glucose: Lab Results  Component Value Date   GLUCAP 121 (H) 06/07/2016   GLUCAP 129 (H) 06/06/2016   GLUCAP 96 06/06/2016   GLUCAP 92 06/06/2016   GLUCAP 108 (H) 06/05/2016     Exercise Target Goals: Date: 07/26/16  Exercise Program Goal: Individual exercise prescription set with THRR, safety & activity barriers. Participant demonstrates ability to understand and report RPE using BORG scale, to self-measure pulse accurately, and to acknowledge the importance of the exercise prescription.  Exercise Prescription Goal: Starting with aerobic activity 30 plus minutes a day, 3 days per week  for initial exercise prescription. Provide home exercise prescription and guidelines that participant acknowledges understanding prior to discharge.  Activity Barriers & Risk Stratification:     Activity Barriers & Cardiac Risk Stratification - 07/26/16 0916      Activity Barriers & Cardiac Risk Stratification   Activity Barriers Muscular Weakness;Deconditioning   Cardiac Risk Stratification High      6 Minute Walk:     6 Minute Walk    Row Name 07/26/16 1228         6 Minute Walk   Phase Initial     Distance 1310 feet     Walk Time 6 minutes     # of Rest Breaks 0     MPH 2.48     METS 3.26     RPE 12     VO2 Peak 11.4     Symptoms No     Resting HR 58 bpm     Resting BP 125/79     Max Ex. HR 61 bpm     Max Ex. BP 150/86     2 Minute Post BP 147/86  132 78        Oxygen Initial Assessment:   Oxygen Re-Evaluation:   Oxygen  Discharge (Final Oxygen Re-Evaluation):   Initial Exercise Prescription:     Initial Exercise Prescription - 07/26/16 1200      Date of Initial Exercise RX and Referring Provider   Date 07/26/16   Referring Provider Skeet Latch MD     Treadmill   MPH 2.2   Grade 0   Minutes 10   METs 2.68     Bike   Level 0.5   Minutes 10   METs 2.26     NuStep   Level 3   SPM 70   Minutes 10   METs 2     Prescription Details   Frequency (times per week) 3   Duration Progress to 30 minutes of continuous aerobic without signs/symptoms of physical distress     Intensity   THRR 40-80% of Max Heartrate 61-122   Ratings of Perceived Exertion 11-13   Perceived Dyspnea 0-4     Progression   Progression Continue to progress workloads to maintain intensity without signs/symptoms of physical distress.     Resistance Training   Training Prescription Yes   Weight 2lbs   Reps 10-15      Perform Capillary Blood Glucose checks as needed.  Exercise Prescription Changes:   Exercise Comments:   Exercise Goals and Review:     Exercise Goals    Row Name 07/26/16 0916             Exercise Goals   Increase Physical Activity Yes       Intervention Provide advice, education, support and counseling about physical activity/exercise needs.;Develop an individualized exercise prescription for aerobic and resistive training based on initial evaluation findings, risk stratification, comorbidities and participant's personal goals.       Expected Outcomes Achievement of increased cardiorespiratory fitness and enhanced flexibility, muscular endurance and strength shown through measurements of functional capacity and personal statement of participant.       Increase Strength and Stamina Yes  learn exercise and activity limitations and develop an exercise routine       Intervention Provide advice, education, support and counseling about physical activity/exercise needs.;Develop an  individualized exercise prescription for aerobic and resistive training based on initial evaluation findings, risk stratification, comorbidities and participant's personal goals.  Expected Outcomes Achievement of increased cardiorespiratory fitness and enhanced flexibility, muscular endurance and strength shown through measurements of functional capacity and personal statement of participant.          Exercise Goals Re-Evaluation :    Discharge Exercise Prescription (Final Exercise Prescription Changes):   Nutrition:  Target Goals: Understanding of nutrition guidelines, daily intake of sodium 1500mg , cholesterol 200mg , calories 30% from fat and 7% or less from saturated fats, daily to have 5 or more servings of fruits and vegetables.  Biometrics:     Pre Biometrics - 07/26/16 1229      Pre Biometrics   Waist Circumference 32.5 inches   Hip Circumference 37.5 inches   Waist to Hip Ratio 0.87 %   Triceps Skinfold 10 mm   % Body Fat 19.7 %   Grip Strength 41 kg   Flexibility 8.5 in   Single Leg Stand 6.09 seconds       Nutrition Therapy Plan and Nutrition Goals:   Nutrition Discharge: Nutrition Scores:   Nutrition Goals Re-Evaluation:   Nutrition Goals Re-Evaluation:   Nutrition Goals Discharge (Final Nutrition Goals Re-Evaluation):   Psychosocial: Target Goals: Acknowledge presence or absence of significant depression and/or stress, maximize coping skills, provide positive support system. Participant is able to verbalize types and ability to use techniques and skills needed for reducing stress and depression.  Initial Review & Psychosocial Screening:     Initial Psych Review & Screening - 07/26/16 1542      Initial Review   Current issues with None Identified     Family Dynamics   Good Support System? Yes     Barriers   Psychosocial barriers to participate in program There are no identifiable barriers or psychosocial needs.     Screening  Interventions   Interventions Encouraged to exercise      Quality of Life Scores:     Quality of Life - 07/26/16 1226      Quality of Life Scores   Health/Function Pre 25.6 %   Socioeconomic Pre 26.57 %   Psych/Spiritual Pre 29.14 %   Family Pre 26.4 %   GLOBAL Pre 26.65 %      PHQ-9: Recent Review Flowsheet Data    Depression screen [facility] 2/9 11/01/2015   Decreased Interest 0   Down, Depressed, Hopeless 0   PHQ - 2 Score 0     Interpretation of Total Score  Total Score Depression Severity:  1-4 = Minimal depression, 5-9 = Mild depression, 10-14 = Moderate depression, 15-19 = Moderately severe depression, 20-27 = Severe depression   Psychosocial Evaluation and Intervention:   Psychosocial Re-Evaluation:   Psychosocial Discharge (Final Psychosocial Re-Evaluation):   Vocational Rehabilitation: Provide vocational rehab assistance to qualifying candidates.   Vocational Rehab Evaluation & Intervention:     Vocational Rehab - 07/26/16 1543      Initial Vocational Rehab Evaluation & Intervention   Assessment shows need for Vocational Rehabilitation No      Education: Education Goals: Education classes will be provided on a weekly basis, covering required topics. Participant will state understanding/return demonstration of topics presented.  Learning Barriers/Preferences:     Learning Barriers/Preferences - 07/26/16 0916      Learning Barriers/Preferences   Learning Barriers Sight   Learning Preferences Video;Pictoral      Education Topics: Count Your Pulse:  -Group instruction provided by verbal instruction, demonstration, patient participation and written materials to support subject.  Instructors address importance of being able to find your pulse and how  to count your pulse when at home without a heart monitor.  Patients get hands on experience counting their pulse with staff help and individually.   Heart Attack, Angina, and Risk Factor Modification:   -Group instruction provided by verbal instruction, video, and written materials to support subject.  Instructors address signs and symptoms of angina and heart attacks.    Also discuss risk factors for heart disease and how to make changes to improve heart health risk factors.   Functional Fitness:  -Group instruction provided by verbal instruction, demonstration, patient participation, and written materials to support subject.  Instructors address safety measures for doing things around the house.  Discuss how to get up and down off the floor, how to pick things up properly, how to safely get out of a chair without assistance, and balance training.   Meditation and Mindfulness:  -Group instruction provided by verbal instruction, patient participation, and written materials to support subject.  Instructor addresses importance of mindfulness and meditation practice to help reduce stress and improve awareness.  Instructor also leads participants through a meditation exercise.    Stretching for Flexibility and Mobility:  -Group instruction provided by verbal instruction, patient participation, and written materials to support subject.  Instructors lead participants through series of stretches that are designed to increase flexibility thus improving mobility.  These stretches are additional exercise for major muscle groups that are typically performed during regular warm up and cool down.   Hands Only CPR Anytime:  -Group instruction provided by verbal instruction, video, patient participation and written materials to support subject.  Instructors co-teach with AHA video for hands only CPR.  Participants get hands on experience with mannequins.   Nutrition I class: Heart Healthy Eating:  -Group instruction provided by PowerPoint slides, verbal discussion, and written materials to support subject matter. The instructor gives an explanation and review of the Therapeutic Lifestyle Changes diet  recommendations, which includes a discussion on lipid goals, dietary fat, sodium, fiber, plant stanol/sterol esters, sugar, and the components of a well-balanced, healthy diet.   Nutrition II class: Lifestyle Skills:  -Group instruction provided by PowerPoint slides, verbal discussion, and written materials to support subject matter. The instructor gives an explanation and review of label reading, grocery shopping for heart health, heart healthy recipe modifications, and ways to make healthier choices when eating out.   Diabetes Question & Answer:  -Group instruction provided by PowerPoint slides, verbal discussion, and written materials to support subject matter. The instructor gives an explanation and review of diabetes co-morbidities, pre- and post-prandial blood glucose goals, pre-exercise blood glucose goals, signs, symptoms, and treatment of hypoglycemia and hyperglycemia, and foot care basics.   Diabetes Blitz:  -Group instruction provided by PowerPoint slides, verbal discussion, and written materials to support subject matter. The instructor gives an explanation and review of the physiology behind type 1 and type 2 diabetes, diabetes medications and rational behind using different medications, pre- and post-prandial blood glucose recommendations and Hemoglobin A1c goals, diabetes diet, and exercise including blood glucose guidelines for exercising safely.    Portion Distortion:  -Group instruction provided by PowerPoint slides, verbal discussion, written materials, and food models to support subject matter. The instructor gives an explanation of serving size versus portion size, changes in portions sizes over the last 20 years, and what consists of a serving from each food group.   Stress Management:  -Group instruction provided by verbal instruction, video, and written materials to support subject matter.  Instructors review role of stress in  heart disease and how to cope with stress  positively.     Exercising on Your Own:  -Group instruction provided by verbal instruction, power point, and written materials to support subject.  Instructors discuss benefits of exercise, components of exercise, frequency and intensity of exercise, and end points for exercise.  Also discuss use of nitroglycerin and activating EMS.  Review options of places to exercise outside of rehab.  Review guidelines for sex with heart disease.   Cardiac Drugs I:  -Group instruction provided by verbal instruction and written materials to support subject.  Instructor reviews cardiac drug classes: antiplatelets, anticoagulants, beta blockers, and statins.  Instructor discusses reasons, side effects, and lifestyle considerations for each drug class.   Cardiac Drugs II:  -Group instruction provided by verbal instruction and written materials to support subject.  Instructor reviews cardiac drug classes: angiotensin converting enzyme inhibitors (ACE-I), angiotensin II receptor blockers (ARBs), nitrates, and calcium channel blockers.  Instructor discusses reasons, side effects, and lifestyle considerations for each drug class.   Anatomy and Physiology of the Circulatory System:  -Group instruction provided by verbal instruction, video, and written materials to support subject.  Reviews functional anatomy of heart, how it relates to various diagnoses, and what role the heart plays in the overall system.   Knowledge Questionnaire Score:     Knowledge Questionnaire Score - 07/26/16 1223      Knowledge Questionnaire Score   Pre Score 15/24      Core Components/Risk Factors/Patient Goals at Admission:     Personal Goals and Risk Factors at Admission - 07/26/16 1541      Core Components/Risk Factors/Patient Goals on Admission   Tobacco Cessation Yes   Expected Outcomes Long Term: Complete abstinence from all tobacco products for at least 12 months from quit date.      Core Components/Risk  Factors/Patient Goals Review:    Core Components/Risk Factors/Patient Goals at Discharge (Final Review):    ITP Comments:     ITP Comments    Row Name 07/26/16 0915           ITP Comments Medical Director, Dr. Fransico Him          Comments:  Patient attended orientation from 0800 to 1030 to review rules and guidelines for program. As a part of the orientation appt pt completed warm up stretches andcompleted 6 minute walk test, Intitial ITP, and exercise prescription.  VSS, asymptomatic, Telemetry-SR with ST depression.  This improved with change lead placement and reduction of the size of gain. Brief psychosocial assessment reveals no immediate barriers to participating in cardiac rehab. Pt is accompanied by his ex wife who is very involved in his care.  Pt will attend twice a week due to copay - 20.00. Cherre Huger, BSN Cardiac and Training and development officer

## 2016-08-03 ENCOUNTER — Encounter (HOSPITAL_COMMUNITY)
Admission: RE | Admit: 2016-08-03 | Discharge: 2016-08-03 | Disposition: A | Discharge status: Home / Self Care | Payer: Medicare Other | Source: Ambulatory Visit | Attending: Cardiovascular Disease | Admitting: Cardiovascular Disease

## 2016-08-03 DIAGNOSIS — Z7982 Long term (current) use of aspirin: Secondary | ICD-10-CM | POA: Diagnosis not present

## 2016-08-03 DIAGNOSIS — Z951 Presence of aortocoronary bypass graft: Secondary | ICD-10-CM | POA: Diagnosis not present

## 2016-08-03 DIAGNOSIS — Z79899 Other long term (current) drug therapy: Secondary | ICD-10-CM | POA: Diagnosis not present

## 2016-08-03 DIAGNOSIS — Z87891 Personal history of nicotine dependence: Secondary | ICD-10-CM | POA: Diagnosis not present

## 2016-08-03 NOTE — Progress Notes (Signed)
Daily Session Note  Patient Details  Name: Christian Black MRN: [id number] Date of Birth: 1945/10/04 Referring Provider:     CARDIAC REHAB PHASE II ORIENTATION from 07/26/2016 in [place]  Referring Provider  Skeet Latch MD      Encounter Date: 08/03/2016  Check In:   Capillary Blood Glucose: No results found for this or any previous visit (from the past 24 hour(s)).    History  Smoking Status  . Former Smoker  . Packs/day: 0.50  . Years: 30.00  . Types: Cigarettes  . Quit date: 06/01/2016  Smokeless Tobacco  . Never Used    Goals Met:  Exercise tolerated well  Goals Unmet:  Not Applicable  Comments: [name] started cardiac rehab today.  Pt tolerated light exercise without difficulty. VSS, telemetry-Sinus rhythm with an inverted T wave, asymptomatic.  Medication list reconciled. Pt denies barriers to medicaiton compliance.  PSYCHOSOCIAL ASSESSMENT:  PHQ-0. Pt exhibits positive coping skills, hopeful outlook with supportive family. No psychosocial needs identified at this time, no psychosocial interventions necessary.    Pt enjoys watching TV.   Pt oriented to exercise equipment and routine.    Understanding verbalized.Barnet Pall, RN,BSN 08/03/2016 10:59 AM   Dr. Fransico Him is Medical Director for Cardiac Rehab at [facility].

## 2016-08-06 ENCOUNTER — Encounter (HOSPITAL_COMMUNITY): Payer: Medicare Other

## 2016-08-08 ENCOUNTER — Encounter (HOSPITAL_COMMUNITY)
Admission: RE | Admit: 2016-08-08 | Discharge: 2016-08-08 | Disposition: A | Discharge status: Home / Self Care | Payer: Medicare Other | Source: Ambulatory Visit | Attending: Cardiovascular Disease | Admitting: Cardiovascular Disease

## 2016-08-08 DIAGNOSIS — Z79899 Other long term (current) drug therapy: Secondary | ICD-10-CM | POA: Diagnosis not present

## 2016-08-08 DIAGNOSIS — Z87891 Personal history of nicotine dependence: Secondary | ICD-10-CM | POA: Diagnosis not present

## 2016-08-08 DIAGNOSIS — Z951 Presence of aortocoronary bypass graft: Secondary | ICD-10-CM

## 2016-08-08 DIAGNOSIS — Z7982 Long term (current) use of aspirin: Secondary | ICD-10-CM | POA: Diagnosis not present

## 2016-08-10 ENCOUNTER — Encounter (HOSPITAL_COMMUNITY)
Admission: RE | Admit: 2016-08-10 | Discharge: 2016-08-10 | Disposition: A | Discharge status: Home / Self Care | Payer: Medicare Other | Source: Ambulatory Visit | Attending: Cardiovascular Disease | Admitting: Cardiovascular Disease

## 2016-08-10 DIAGNOSIS — Z79899 Other long term (current) drug therapy: Secondary | ICD-10-CM | POA: Diagnosis not present

## 2016-08-10 DIAGNOSIS — Z951 Presence of aortocoronary bypass graft: Secondary | ICD-10-CM

## 2016-08-10 DIAGNOSIS — Z7982 Long term (current) use of aspirin: Secondary | ICD-10-CM | POA: Diagnosis not present

## 2016-08-10 DIAGNOSIS — Z87891 Personal history of nicotine dependence: Secondary | ICD-10-CM | POA: Diagnosis not present

## 2016-08-13 ENCOUNTER — Encounter (HOSPITAL_COMMUNITY): Payer: Medicare Other

## 2016-08-15 ENCOUNTER — Encounter (HOSPITAL_COMMUNITY)
Admission: RE | Admit: 2016-08-15 | Discharge: 2016-08-15 | Disposition: A | Discharge status: Home / Self Care | Payer: Medicare Other | Source: Ambulatory Visit | Attending: Cardiovascular Disease | Admitting: Cardiovascular Disease

## 2016-08-15 DIAGNOSIS — Z951 Presence of aortocoronary bypass graft: Secondary | ICD-10-CM

## 2016-08-15 DIAGNOSIS — Z87891 Personal history of nicotine dependence: Secondary | ICD-10-CM | POA: Diagnosis not present

## 2016-08-15 DIAGNOSIS — Z7982 Long term (current) use of aspirin: Secondary | ICD-10-CM | POA: Diagnosis not present

## 2016-08-15 DIAGNOSIS — Z79899 Other long term (current) drug therapy: Secondary | ICD-10-CM | POA: Diagnosis not present

## 2016-08-17 ENCOUNTER — Encounter (HOSPITAL_COMMUNITY)
Admission: RE | Admit: 2016-08-17 | Discharge: 2016-08-17 | Disposition: A | Discharge status: Home / Self Care | Payer: Medicare Other | Source: Ambulatory Visit | Attending: Cardiovascular Disease | Admitting: Cardiovascular Disease

## 2016-08-17 DIAGNOSIS — Z7982 Long term (current) use of aspirin: Secondary | ICD-10-CM | POA: Diagnosis not present

## 2016-08-17 DIAGNOSIS — Z79899 Other long term (current) drug therapy: Secondary | ICD-10-CM | POA: Diagnosis not present

## 2016-08-17 DIAGNOSIS — Z951 Presence of aortocoronary bypass graft: Secondary | ICD-10-CM | POA: Diagnosis not present

## 2016-08-17 DIAGNOSIS — Z87891 Personal history of nicotine dependence: Secondary | ICD-10-CM | POA: Diagnosis not present

## 2016-08-20 ENCOUNTER — Encounter (HOSPITAL_COMMUNITY): Payer: Medicare Other

## 2016-08-20 NOTE — Progress Notes (Signed)
Reviewed home exercise program with pt.  Discussed mode/frequency of exercise, target heart rate range, rate of perceived exertion scale and weather conditions for exercising outdoors.  Also, discussed signs and symptoms and when to call Dr./911.  Pt verbalized understanding.  Cleda Mccreedy, MS ACSM RCEP 08/20/2016 17:12

## 2016-08-21 NOTE — Progress Notes (Signed)
Cardiac Individual Treatment Plan  Patient Details  Name: Christian Black MRN: [id number] Date of Birth: 04-24-1945 Referring Provider:     CARDIAC REHAB PHASE II ORIENTATION from 07/26/2016 in [place]  Referring Provider  Skeet Latch MD      Initial Encounter Date:    CARDIAC REHAB PHASE II ORIENTATION from 07/26/2016 in [place]  Date  07/26/16  Referring Provider  Skeet Latch MD      Visit Diagnosis: 06/01/16 S/P CABG x 5  Patient's Home Medications on Admission:  Current Outpatient Prescriptions:  .  amiodarone (PACERONE) 200 MG tablet, Take 1 tablet (200 mg total) by mouth every 12 (twelve) hours. For 2 days then take Amiodarone 200 mg by mouth daily thereafter, Disp: 60 tablet, Rfl: 1 .  aspirin EC 81 MG tablet, Take 81 mg by mouth daily., Disp: , Rfl:  .  atorvastatin (LIPITOR) 80 MG tablet, Take 1 tablet (80 mg total) by mouth daily at 6 PM., Disp: 90 tablet, Rfl: 3 .  guaiFENesin (MUCINEX) 600 MG 12 hr tablet, Take 1 tablet (600 mg total) by mouth 2 (two) times daily as needed for cough or to loosen phlegm., Disp: , Rfl:  .  metoprolol succinate (TOPROL XL) 25 MG 24 hr tablet, Take 1 tablet (25 mg total) by mouth daily., Disp: 90 tablet, Rfl: 3 .  traMADol (ULTRAM) 50 MG tablet, TAKE 1 TABLET BY MOUTH EVERY 4 TO 6 HOURS AS NEEDED FOR PAIN, Disp: 30 tablet, Rfl: 0  Past Medical History: Past Medical History:  Diagnosis Date  . Colon cancer ([place])    colon mass  . Essential hypertension 05/23/2016  . Medical history non-contributory   . Tobacco abuse 05/23/2016    Tobacco Use: History  Smoking Status  . Former Smoker  . Packs/day: 0.50  . Years: 30.00  . Types: Cigarettes  . Quit date: 06/01/2016  Smokeless Tobacco  . Never Used    Labs: Recent Review Flowsheet Data    Labs for ITP Cardiac and Pulmonary Rehab Latest Ref Rng & Units 06/01/2016 06/01/2016 06/01/2016 06/02/2016 06/28/2016    Cholestrol 100 - 199 mg/dL - - - - 124   LDLCALC 0 - 99 mg/dL - - - - 51   HDL >39 mg/dL - - - - 66   Trlycerides 0 - 149 mg/dL - - - - 33   Hemoglobin A1c 4.8 - 5.6 % - - - - -   PHART 7.350 - 7.450 7.333(L) - 7.342(L) - -   PCO2ART 32.0 - 48.0 mmHg 47.3 - 49.6(H) - -   HCO3 20.0 - 28.0 mmol/L 25.2 - 26.8 - -   TCO2 0 - 100 mmol/L 27 27 28 31  -   ACIDBASEDEF 0.0 - 2.0 mmol/L 1.0 - - - -   O2SAT % 97.0 - 98.0 - -      Capillary Blood Glucose: Lab Results  Component Value Date   GLUCAP 121 (H) 06/07/2016   GLUCAP 129 (H) 06/06/2016   GLUCAP 96 06/06/2016   GLUCAP 92 06/06/2016   GLUCAP 108 (H) 06/05/2016     Exercise Target Goals:    Exercise Program Goal: Individual exercise prescription set with THRR, safety & activity barriers. Participant demonstrates ability to understand and report RPE using BORG scale, to self-measure pulse accurately, and to acknowledge the importance of the exercise prescription.  Exercise Prescription Goal: Starting with aerobic activity 30 plus minutes a day, 3 days per week  for initial exercise prescription. Provide home exercise prescription and guidelines that participant acknowledges understanding prior to discharge.  Activity Barriers & Risk Stratification:     Activity Barriers & Cardiac Risk Stratification - 07/26/16 0916      Activity Barriers & Cardiac Risk Stratification   Activity Barriers Muscular Weakness;Deconditioning   Cardiac Risk Stratification High      6 Minute Walk:     6 Minute Walk    Row Name 07/26/16 1228         6 Minute Walk   Phase Initial     Distance 1310 feet     Walk Time 6 minutes     # of Rest Breaks 0     MPH 2.48     METS 3.26     RPE 12     VO2 Peak 11.4     Symptoms No     Resting HR 58 bpm     Resting BP 125/79     Max Ex. HR 61 bpm     Max Ex. BP 150/86     2 Minute Post BP 147/86  132 78        Oxygen Initial Assessment:   Oxygen Re-Evaluation:   Oxygen Discharge (Final  Oxygen Re-Evaluation):   Initial Exercise Prescription:     Initial Exercise Prescription - 07/26/16 1200      Date of Initial Exercise RX and Referring Provider   Date 07/26/16   Referring Provider Skeet Latch MD     Treadmill   MPH 2.2   Grade 0   Minutes 10   METs 2.68     Bike   Level 0.5   Minutes 10   METs 2.26     NuStep   Level 3   SPM 70   Minutes 10   METs 2     Prescription Details   Frequency (times per week) 3   Duration Progress to 30 minutes of continuous aerobic without signs/symptoms of physical distress     Intensity   THRR 40-80% of Max Heartrate 61-122   Ratings of Perceived Exertion 11-13   Perceived Dyspnea 0-4     Progression   Progression Continue to progress workloads to maintain intensity without signs/symptoms of physical distress.     Resistance Training   Training Prescription Yes   Weight 2lbs   Reps 10-15      Perform Capillary Blood Glucose checks as needed.  Exercise Prescription Changes:     Exercise Prescription Changes    Row Name 08/08/16 0800             Response to Exercise   Blood Pressure (Admit) 144/83       Blood Pressure (Exercise) 134/76       Blood Pressure (Exit) 110/80       Heart Rate (Admit) 66 bpm       Heart Rate (Exercise) 84 bpm       Heart Rate (Exit) 59 bpm       Rating of Perceived Exertion (Exercise) 13       Duration Progress to 45 minutes of aerobic exercise without signs/symptoms of physical distress       Intensity THRR unchanged         Progression   Progression Continue to progress workloads to maintain intensity without signs/symptoms of physical distress.       Average METs 2.2         Resistance Training   Training Prescription Yes  Weight 2lbs       Reps 10-15         Treadmill   MPH 2.2       Grade 0       Minutes 10       METs 2.68         Bike   Level 0.5       Minutes 10       METs 2.26         NuStep   Level 3       SPM 70       Minutes 10        METs 2          Exercise Comments:     Exercise Comments    Row Name 08/15/16 1700           Exercise Comments Pt is doing well with exercise          Exercise Goals and Review:     Exercise Goals    [place] Name 07/26/16 0916             Exercise Goals   Increase Physical Activity Yes       Intervention Provide advice, education, support and counseling about physical activity/exercise needs.;Develop an individualized exercise prescription for aerobic and resistive training based on initial evaluation findings, risk stratification, comorbidities and participant's personal goals.       Expected Outcomes Achievement of increased cardiorespiratory fitness and enhanced flexibility, muscular endurance and strength shown through measurements of functional capacity and personal statement of participant.       Increase Strength and Stamina Yes  learn exercise and activity limitations and develop an exercise routine       Intervention Provide advice, education, support and counseling about physical activity/exercise needs.;Develop an individualized exercise prescription for aerobic and resistive training based on initial evaluation findings, risk stratification, comorbidities and participant's personal goals.       Expected Outcomes Achievement of increased cardiorespiratory fitness and enhanced flexibility, muscular endurance and strength shown through measurements of functional capacity and personal statement of participant.          Exercise Goals Re-Evaluation :     Exercise Goals Re-Evaluation    Row Name 08/15/16 1658             Exercise Goal Re-Evaluation   Exercise Goals Review Increase Physical Activity;Increase Strenth and Stamina       Comments Pt states that he is enjoying exercising and he feels great       Expected Outcomes continue with exercise Rx and gradually increase workloads as tolerated in order to increase strength and stamina            Discharge Exercise Prescription (Final Exercise Prescription Changes):     Exercise Prescription Changes - 08/08/16 0800      Response to Exercise   Blood Pressure (Admit) 144/83   Blood Pressure (Exercise) 134/76   Blood Pressure (Exit) 110/80   Heart Rate (Admit) 66 bpm   Heart Rate (Exercise) 84 bpm   Heart Rate (Exit) 59 bpm   Rating of Perceived Exertion (Exercise) 13   Duration Progress to 45 minutes of aerobic exercise without signs/symptoms of physical distress   Intensity THRR unchanged     Progression   Progression Continue to progress workloads to maintain intensity without signs/symptoms of physical distress.   Average METs 2.2     Resistance Training   Training Prescription Yes   Weight  2lbs   Reps 10-15     Treadmill   MPH 2.2   Grade 0   Minutes 10   METs 2.68     Bike   Level 0.5   Minutes 10   METs 2.26     NuStep   Level 3   SPM 70   Minutes 10   METs 2      Nutrition:  Target Goals: Understanding of nutrition guidelines, daily intake of sodium 1500mg , cholesterol 200mg , calories 30% from fat and 7% or less from saturated fats, daily to have 5 or more servings of fruits and vegetables.  Biometrics:     Pre Biometrics - 07/26/16 1229      Pre Biometrics   Waist Circumference 32.5 inches   Hip Circumference 37.5 inches   Waist to Hip Ratio 0.87 %   Triceps Skinfold 10 mm   % Body Fat 19.7 %   Grip Strength 41 kg   Flexibility 8.5 in   Single Leg Stand 6.09 seconds       Nutrition Therapy Plan and Nutrition Goals:   Nutrition Discharge: Nutrition Scores:   Nutrition Goals Re-Evaluation:   Nutrition Goals Re-Evaluation:   Nutrition Goals Discharge (Final Nutrition Goals Re-Evaluation):   Psychosocial: Target Goals: Acknowledge presence or absence of significant depression and/or stress, maximize coping skills, provide positive support system. Participant is able to verbalize types and ability to use techniques  and skills needed for reducing stress and depression.  Initial Review & Psychosocial Screening:     Initial Psych Review & Screening - 07/26/16 1542      Initial Review   Current issues with None Identified     Family Dynamics   Good Support System? Yes     Barriers   Psychosocial barriers to participate in program There are no identifiable barriers or psychosocial needs.     Screening Interventions   Interventions Encouraged to exercise      Quality of Life Scores:     Quality of Life - 07/26/16 1226      Quality of Life Scores   Health/Function Pre 25.6 %   Socioeconomic Pre 26.57 %   Psych/Spiritual Pre 29.14 %   Family Pre 26.4 %   GLOBAL Pre 26.65 %      PHQ-9: Recent Review Flowsheet Data    Depression screen [facility] 2/9 11/01/2015   Decreased Interest 0   Down, Depressed, Hopeless 0   PHQ - 2 Score 0     Interpretation of Total Score  Total Score Depression Severity:  1-4 = Minimal depression, 5-9 = Mild depression, 10-14 = Moderate depression, 15-19 = Moderately severe depression, 20-27 = Severe depression   Psychosocial Evaluation and Intervention:   Psychosocial Re-Evaluation:     Psychosocial Re-Evaluation    [place] Name 08/21/16 1020 08/21/16 1105           Psychosocial Re-Evaluation   Current issues with (P)  None Identified None Identified      Interventions (P)  Encouraged to attend Cardiac Rehabilitation for the exercise Encouraged to attend Cardiac Rehabilitation for the exercise      Continue Psychosocial Services  (P)  No Follow up required No Follow up required         Psychosocial Discharge (Final Psychosocial Re-Evaluation):     Psychosocial Re-Evaluation - 08/21/16 1105      Psychosocial Re-Evaluation   Current issues with None Identified   Interventions Encouraged to attend Cardiac Rehabilitation for the exercise   Continue  Psychosocial Services  No Follow up required      Vocational Rehabilitation: Provide vocational rehab  assistance to qualifying candidates.   Vocational Rehab Evaluation & Intervention:     Vocational Rehab - 07/26/16 1543      Initial Vocational Rehab Evaluation & Intervention   Assessment shows need for Vocational Rehabilitation No      Education: Education Goals: Education classes will be provided on a weekly basis, covering required topics. Participant will state understanding/return demonstration of topics presented.  Learning Barriers/Preferences:     Learning Barriers/Preferences - 07/26/16 0916      Learning Barriers/Preferences   Learning Barriers Sight   Learning Preferences Video;Pictoral      Education Topics: Count Your Pulse:  -Group instruction provided by verbal instruction, demonstration, patient participation and written materials to support subject.  Instructors address importance of being able to find your pulse and how to count your pulse when at home without a heart monitor.  Patients get hands on experience counting their pulse with staff help and individually.   Heart Attack, Angina, and Risk Factor Modification:  -Group instruction provided by verbal instruction, video, and written materials to support subject.  Instructors address signs and symptoms of angina and heart attacks.    Also discuss risk factors for heart disease and how to make changes to improve heart health risk factors.   Functional Fitness:  -Group instruction provided by verbal instruction, demonstration, patient participation, and written materials to support subject.  Instructors address safety measures for doing things around the house.  Discuss how to get up and down off the floor, how to pick things up properly, how to safely get out of a chair without assistance, and balance training.   Meditation and Mindfulness:  -Group instruction provided by verbal instruction, patient participation, and written materials to support subject.  Instructor addresses importance of mindfulness and  meditation practice to help reduce stress and improve awareness.  Instructor also leads participants through a meditation exercise.    Stretching for Flexibility and Mobility:  -Group instruction provided by verbal instruction, patient participation, and written materials to support subject.  Instructors lead participants through series of stretches that are designed to increase flexibility thus improving mobility.  These stretches are additional exercise for major muscle groups that are typically performed during regular warm up and cool down.   Hands Only CPR:  -Group verbal, video, and participation provides a basic overview of AHA guidelines for community CPR. Role-play of emergencies allow participants the opportunity to practice calling for help and chest compression technique with discussion of AED use.   Hypertension: -Group verbal and written instruction that provides a basic overview of hypertension including the most recent diagnostic guidelines, risk factor reduction with self-care instructions and medication management.    Nutrition I class: Heart Healthy Eating:  -Group instruction provided by PowerPoint slides, verbal discussion, and written materials to support subject matter. The instructor gives an explanation and review of the Therapeutic Lifestyle Changes diet recommendations, which includes a discussion on lipid goals, dietary fat, sodium, fiber, plant stanol/sterol esters, sugar, and the components of a well-balanced, healthy diet.   Nutrition II class: Lifestyle Skills:  -Group instruction provided by PowerPoint slides, verbal discussion, and written materials to support subject matter. The instructor gives an explanation and review of label reading, grocery shopping for heart health, heart healthy recipe modifications, and ways to make healthier choices when eating out.   Diabetes Question & Answer:  -Group instruction provided by PowerPoint slides,  verbal discussion,  and written materials to support subject matter. The instructor gives an explanation and review of diabetes co-morbidities, pre- and post-prandial blood glucose goals, pre-exercise blood glucose goals, signs, symptoms, and treatment of hypoglycemia and hyperglycemia, and foot care basics.   Diabetes Blitz:  -Group instruction provided by PowerPoint slides, verbal discussion, and written materials to support subject matter. The instructor gives an explanation and review of the physiology behind type 1 and type 2 diabetes, diabetes medications and rational behind using different medications, pre- and post-prandial blood glucose recommendations and Hemoglobin A1c goals, diabetes diet, and exercise including blood glucose guidelines for exercising safely.    Portion Distortion:  -Group instruction provided by PowerPoint slides, verbal discussion, written materials, and food models to support subject matter. The instructor gives an explanation of serving size versus portion size, changes in portions sizes over the last 20 years, and what consists of a serving from each food group.   Stress Management:  -Group instruction provided by verbal instruction, video, and written materials to support subject matter.  Instructors review role of stress in heart disease and how to cope with stress positively.     Exercising on Your Own:  -Group instruction provided by verbal instruction, power point, and written materials to support subject.  Instructors discuss benefits of exercise, components of exercise, frequency and intensity of exercise, and end points for exercise.  Also discuss use of nitroglycerin and activating EMS.  Review options of places to exercise outside of rehab.  Review guidelines for sex with heart disease.   Cardiac Drugs I:  -Group instruction provided by verbal instruction and written materials to support subject.  Instructor reviews cardiac drug classes: antiplatelets, anticoagulants, beta  blockers, and statins.  Instructor discusses reasons, side effects, and lifestyle considerations for each drug class.   Cardiac Drugs II:  -Group instruction provided by verbal instruction and written materials to support subject.  Instructor reviews cardiac drug classes: angiotensin converting enzyme inhibitors (ACE-I), angiotensin II receptor blockers (ARBs), nitrates, and calcium channel blockers.  Instructor discusses reasons, side effects, and lifestyle considerations for each drug class.   Anatomy and Physiology of the Circulatory System:  Group verbal and written instruction and models provide basic cardiac anatomy and physiology, with the coronary electrical and arterial systems. Review of: AMI, Angina, Valve disease, Heart Failure, Peripheral Artery Disease, Cardiac Arrhythmia, Pacemakers, and the ICD.   Other Education:  -Group or individual verbal, written, or video instructions that support the educational goals of the cardiac rehab program.   Knowledge Questionnaire Score:     Knowledge Questionnaire Score - 07/26/16 1223      Knowledge Questionnaire Score   Pre Score 15/24      Core Components/Risk Factors/Patient Goals at Admission:     Personal Goals and Risk Factors at Admission - 07/26/16 1541      Core Components/Risk Factors/Patient Goals on Admission   Tobacco Cessation Yes   Expected Outcomes Long Term: Complete abstinence from all tobacco products for at least 12 months from quit date.      Core Components/Risk Factors/Patient Goals Review:    Core Components/Risk Factors/Patient Goals at Discharge (Final Review):    ITP Comments:     ITP Comments    Row Name 07/26/16 0915           ITP Comments Medical Director, Dr. Fransico Him          Comments: Kayl is making expected progress toward personal goals after completing 6 sessions. Recommend continued  exercise and life style modification education including  stress management and  relaxation techniques to decrease cardiac risk profile. Forrester is off to a good start with exercise.Barnet Pall, RN,BSN 08/21/2016 11:10 AM

## 2016-08-22 ENCOUNTER — Encounter (HOSPITAL_COMMUNITY): Payer: Self-pay | Admitting: *Deleted

## 2016-08-22 ENCOUNTER — Telehealth (HOSPITAL_COMMUNITY): Payer: Self-pay | Admitting: Internal Medicine

## 2016-08-22 ENCOUNTER — Encounter (HOSPITAL_COMMUNITY)
Admission: RE | Admit: 2016-08-22 | Discharge: 2016-08-22 | Disposition: A | Discharge status: Home / Self Care | Payer: Medicare Other | Source: Ambulatory Visit

## 2016-08-22 DIAGNOSIS — Z951 Presence of aortocoronary bypass graft: Secondary | ICD-10-CM

## 2016-08-24 ENCOUNTER — Encounter (HOSPITAL_COMMUNITY)
Admission: RE | Admit: 2016-08-24 | Discharge: 2016-08-24 | Disposition: A | Discharge status: Home / Self Care | Payer: Medicare Other | Source: Ambulatory Visit | Attending: Cardiovascular Disease | Admitting: Cardiovascular Disease

## 2016-08-24 DIAGNOSIS — Z951 Presence of aortocoronary bypass graft: Secondary | ICD-10-CM

## 2016-08-24 DIAGNOSIS — Z87891 Personal history of nicotine dependence: Secondary | ICD-10-CM | POA: Diagnosis not present

## 2016-08-24 DIAGNOSIS — Z7982 Long term (current) use of aspirin: Secondary | ICD-10-CM | POA: Diagnosis not present

## 2016-08-24 DIAGNOSIS — Z79899 Other long term (current) drug therapy: Secondary | ICD-10-CM | POA: Diagnosis not present

## 2016-08-29 ENCOUNTER — Encounter (HOSPITAL_COMMUNITY)
Admission: RE | Admit: 2016-08-29 | Discharge: 2016-08-29 | Disposition: A | Discharge status: Home / Self Care | Payer: Medicare Other | Source: Ambulatory Visit | Attending: Cardiovascular Disease | Admitting: Cardiovascular Disease

## 2016-08-29 DIAGNOSIS — Z951 Presence of aortocoronary bypass graft: Secondary | ICD-10-CM | POA: Diagnosis not present

## 2016-08-29 DIAGNOSIS — Z7982 Long term (current) use of aspirin: Secondary | ICD-10-CM | POA: Diagnosis not present

## 2016-08-29 DIAGNOSIS — Z79899 Other long term (current) drug therapy: Secondary | ICD-10-CM | POA: Diagnosis not present

## 2016-08-29 DIAGNOSIS — Z87891 Personal history of nicotine dependence: Secondary | ICD-10-CM | POA: Diagnosis not present

## 2016-08-31 ENCOUNTER — Encounter (HOSPITAL_COMMUNITY)
Admission: RE | Admit: 2016-08-31 | Discharge: 2016-08-31 | Disposition: A | Discharge status: Home / Self Care | Payer: Medicare Other | Source: Ambulatory Visit | Attending: Cardiovascular Disease | Admitting: Cardiovascular Disease

## 2016-08-31 DIAGNOSIS — Z79899 Other long term (current) drug therapy: Secondary | ICD-10-CM | POA: Diagnosis not present

## 2016-08-31 DIAGNOSIS — Z87891 Personal history of nicotine dependence: Secondary | ICD-10-CM | POA: Insufficient documentation

## 2016-08-31 DIAGNOSIS — Z7982 Long term (current) use of aspirin: Secondary | ICD-10-CM | POA: Insufficient documentation

## 2016-08-31 DIAGNOSIS — Z951 Presence of aortocoronary bypass graft: Secondary | ICD-10-CM

## 2016-09-03 ENCOUNTER — Encounter (HOSPITAL_COMMUNITY): Payer: Medicare Other

## 2016-09-05 ENCOUNTER — Encounter (HOSPITAL_COMMUNITY): Payer: Medicare Other

## 2016-09-05 ENCOUNTER — Telehealth (HOSPITAL_COMMUNITY): Payer: Self-pay | Admitting: Internal Medicine

## 2016-09-07 ENCOUNTER — Encounter (HOSPITAL_COMMUNITY)
Admission: RE | Admit: 2016-09-07 | Discharge: 2016-09-07 | Disposition: A | Discharge status: Home / Self Care | Payer: Medicare Other | Source: Ambulatory Visit | Attending: Cardiovascular Disease | Admitting: Cardiovascular Disease

## 2016-09-07 DIAGNOSIS — Z951 Presence of aortocoronary bypass graft: Secondary | ICD-10-CM | POA: Diagnosis not present

## 2016-09-07 DIAGNOSIS — Z87891 Personal history of nicotine dependence: Secondary | ICD-10-CM | POA: Diagnosis not present

## 2016-09-07 DIAGNOSIS — Z7982 Long term (current) use of aspirin: Secondary | ICD-10-CM | POA: Diagnosis not present

## 2016-09-07 DIAGNOSIS — Z79899 Other long term (current) drug therapy: Secondary | ICD-10-CM | POA: Diagnosis not present

## 2016-09-10 ENCOUNTER — Encounter (HOSPITAL_COMMUNITY): Payer: Medicare Other

## 2016-09-12 ENCOUNTER — Encounter (HOSPITAL_COMMUNITY)
Admission: RE | Admit: 2016-09-12 | Discharge: 2016-09-12 | Disposition: A | Discharge status: Home / Self Care | Payer: Medicare Other | Source: Ambulatory Visit | Attending: Cardiovascular Disease | Admitting: Cardiovascular Disease

## 2016-09-12 DIAGNOSIS — Z951 Presence of aortocoronary bypass graft: Secondary | ICD-10-CM | POA: Diagnosis not present

## 2016-09-12 DIAGNOSIS — Z79899 Other long term (current) drug therapy: Secondary | ICD-10-CM | POA: Diagnosis not present

## 2016-09-12 DIAGNOSIS — Z7982 Long term (current) use of aspirin: Secondary | ICD-10-CM | POA: Diagnosis not present

## 2016-09-12 DIAGNOSIS — Z87891 Personal history of nicotine dependence: Secondary | ICD-10-CM | POA: Diagnosis not present

## 2016-09-13 ENCOUNTER — Ambulatory Visit: Payer: Self-pay | Admitting: Cardiothoracic Surgery

## 2016-09-13 NOTE — Progress Notes (Deleted)
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 SURGICAL PROCEDURE:  Coronary artery bypass grafting x5 with the left internal mammary to the left anterior descending coronary artery, reverse saphenous vein graft to the diagonal coronary artery, sequential reverse saphenous vein graft to the first obtuse marginal and distal circumflex, and reverse saphenous vein graft to the distal right coronary artery with right leg thigh and calf, greater saphenous vein harvesting endoscopically. SURGEON:  Lanelle Bal, MD.   History of Present Illness:     Patient returns to the office stating follow-up aftercoronary artery bypass graft.His postoperative course was complicated by postoperative atrial fibrillation, he's had no recurrence.     Past Medical History:  Diagnosis Date  . Colon cancer ([place])    colon mass  . Essential hypertension 05/23/2016  . Medical history non-contributory   . Tobacco abuse 05/23/2016     History  Smoking Status  . Former Smoker  . Packs/day: 0.50  . Years: 30.00  . Types: Cigarettes  . Quit date: 06/01/2016  Smokeless Tobacco  . Never Used    History  Alcohol Use  . 0.6 - 1.2 oz/week  . 1 - 2 Cans of beer per week    Comment: 1-2 can beer per day     Allergies  Allergen Reactions  . No Known Allergies     Current Outpatient Prescriptions  Medication Sig Dispense Refill  . amiodarone (PACERONE) 200 MG tablet Take 1 tablet (200 mg total) by mouth every 12 (twelve) hours. For 2 days then  take Amiodarone 200 mg by mouth daily thereafter 60 tablet 1  . aspirin EC 81 MG tablet Take 81 mg by mouth daily.    Marland Kitchen atorvastatin (LIPITOR) 80 MG tablet Take 1 tablet (80 mg total) by mouth daily at 6 PM. 90 tablet 3  . guaiFENesin (MUCINEX) 600 MG 12 hr tablet Take 1 tablet (600 mg total) by mouth 2 (two) times daily as needed for cough or to loosen phlegm.    . metoprolol succinate (TOPROL XL) 25 MG 24 hr tablet Take 1 tablet (25 mg total) by mouth daily. 90 tablet 3  . traMADol (ULTRAM) 50 MG tablet TAKE 1 TABLET BY MOUTH EVERY 4 TO 6 HOURS AS NEEDED FOR PAIN 30 tablet 0   No current facility-administered medications for this visit.        Physical Exam: There were no vitals taken for this visit.  General appearance: alert, cooperative and no distress Neurologic: intact Heart: regular rate and rhythm, S1, S2 normal, no murmur, click, rub or gallop Lungs: clear to auscultation bilaterally Abdomen: soft, non-tender; bowel sounds normal; no masses,  no organomegaly Extremities: extremities normal, atraumatic, no cyanosis or edema and Homans sign is negative, no sign of DVT Wound: sternum stable wounds healing   Diagnostic Studies & Laboratory data:     Recent Radiology Findings:   No results found.    Recent Lab Findings: Lab Results  Component Value Date   WBC 5.7 06/06/2016  HGB 9.7 (L) 06/06/2016   HCT 29.6 (L) 06/06/2016   PLT 208 06/06/2016   GLUCOSE 101 (H) 06/28/2016   CHOL 124 06/28/2016   TRIG 33 06/28/2016   HDL 66 06/28/2016   LDLCALC 51 06/28/2016   ALT 16 06/28/2016   AST 21 06/28/2016   NA 137 06/28/2016   K 4.8 06/28/2016   CL 99 06/28/2016   CREATININE 1.11 06/28/2016   BUN 16 06/28/2016   CO2 24 06/28/2016   TSH 1.37 11/01/2015   INR 1.42 06/01/2016   HGBA1C 5.6 05/31/2016      Assessment / Plan:        [name] MD      [address] [place] [address] Office [phone]   Beeper  [phone]  09/13/2016 9:14 AM

## 2016-09-14 ENCOUNTER — Encounter (HOSPITAL_COMMUNITY)
Admission: RE | Admit: 2016-09-14 | Discharge: 2016-09-14 | Disposition: A | Discharge status: Home / Self Care | Payer: Medicare Other | Source: Ambulatory Visit | Attending: Cardiovascular Disease | Admitting: Cardiovascular Disease

## 2016-09-14 DIAGNOSIS — Z87891 Personal history of nicotine dependence: Secondary | ICD-10-CM | POA: Diagnosis not present

## 2016-09-14 DIAGNOSIS — Z951 Presence of aortocoronary bypass graft: Secondary | ICD-10-CM | POA: Diagnosis not present

## 2016-09-14 DIAGNOSIS — Z79899 Other long term (current) drug therapy: Secondary | ICD-10-CM | POA: Diagnosis not present

## 2016-09-14 DIAGNOSIS — Z7982 Long term (current) use of aspirin: Secondary | ICD-10-CM | POA: Diagnosis not present

## 2016-09-17 ENCOUNTER — Encounter (HOSPITAL_COMMUNITY): Payer: Medicare Other

## 2016-09-18 NOTE — Progress Notes (Signed)
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  amiodarone (PACERONE) 200 MG tablet, Take 1 tablet (200 mg total) by mouth every 12 (twelve) hours. For 2 days then take Amiodarone 200 mg by mouth daily thereafter, Disp: 60 tablet, Rfl: 1 .  aspirin EC 81 MG tablet, Take 81 mg by mouth daily., Disp: , Rfl:  .  atorvastatin (LIPITOR) 80 MG tablet, Take 1 tablet (80 mg total) by mouth daily at 6 PM., Disp: 90 tablet, Rfl: 3 .  guaiFENesin (MUCINEX) 600 MG 12 hr tablet, Take 1 tablet (600 mg total) by mouth 2 (two) times daily as needed for cough or to loosen phlegm., Disp: , Rfl:  .  metoprolol succinate (TOPROL XL) 25 MG 24 hr tablet, Take 1 tablet (25 mg total) by mouth daily., Disp: 90 tablet, Rfl: 3 .  traMADol (ULTRAM) 50 MG tablet, TAKE 1 TABLET BY MOUTH EVERY 4 TO 6 HOURS AS NEEDED FOR PAIN, Disp: 30 tablet, Rfl: 0  Past Medical History: Past Medical History:  Diagnosis Date  . Colon cancer ([place])    colon mass  . Essential hypertension 05/23/2016  . Medical history non-contributory   . Tobacco abuse 05/23/2016    Tobacco Use: History  Smoking Status  . Former Smoker  . Packs/day: 0.50  . Years: 30.00  . Types: Cigarettes  . Quit date: 06/01/2016  Smokeless Tobacco  . Never Used    Labs: Recent Review Flowsheet Data    Labs for ITP Cardiac and Pulmonary Rehab Latest Ref Rng & Units 06/01/2016 06/01/2016 06/01/2016 06/02/2016 06/28/2016    Cholestrol 100 - 199 mg/dL - - - - 124   LDLCALC 0 - 99 mg/dL - - - - 51   HDL >39 mg/dL - - - - 66   Trlycerides 0 - 149 mg/dL - - - - 33   Hemoglobin A1c 4.8 - 5.6 % - - - - -   PHART 7.350 - 7.450 7.333(L) - 7.342(L) - -   PCO2ART 32.0 - 48.0 mmHg 47.3 - 49.6(H) - -   HCO3 20.0 - 28.0 mmol/L 25.2 - 26.8 - -   TCO2 0 - 100 mmol/L 27 27 28 31  -   ACIDBASEDEF 0.0 - 2.0 mmol/L 1.0 - - - -   O2SAT % 97.0 - 98.0 - -      Capillary Blood Glucose: Lab Results  Component Value Date   GLUCAP 121 (H) 06/07/2016   GLUCAP 129 (H) 06/06/2016   GLUCAP 96 06/06/2016   GLUCAP 92 06/06/2016   GLUCAP 108 (H) 06/05/2016     Exercise Target Goals:    Exercise Program Goal: Individual exercise prescription set with THRR, safety & activity barriers. Participant demonstrates ability to understand and report RPE using BORG scale, to self-measure pulse accurately, and to acknowledge the importance of the exercise prescription.  Exercise Prescription Goal: Starting with aerobic activity 30 plus minutes a day, 3 days per week  for initial exercise prescription. Provide home exercise prescription and guidelines that participant acknowledges understanding prior to discharge.  Activity Barriers & Risk Stratification:     Activity Barriers & Cardiac Risk Stratification - 07/26/16 0916      Activity Barriers & Cardiac Risk Stratification   Activity Barriers Muscular Weakness;Deconditioning   Cardiac Risk Stratification High      6 Minute Walk:     6 Minute Walk    Row Name 07/26/16 1228         6 Minute Walk   Phase Initial     Distance 1310 feet     Walk Time 6 minutes     # of Rest Breaks 0     MPH 2.48     METS 3.26     RPE 12     VO2 Peak 11.4     Symptoms No     Resting HR 58 bpm     Resting BP 125/79     Max Ex. HR 61 bpm     Max Ex. BP 150/86     2 Minute Post BP 147/86  132 78        Oxygen Initial Assessment:   Oxygen Re-Evaluation:   Oxygen Discharge (Final  Oxygen Re-Evaluation):   Initial Exercise Prescription:     Initial Exercise Prescription - 07/26/16 1200      Date of Initial Exercise RX and Referring Provider   Date 07/26/16   Referring Provider Skeet Latch MD     Treadmill   MPH 2.2   Grade 0   Minutes 10   METs 2.68     Bike   Level 0.5   Minutes 10   METs 2.26     NuStep   Level 3   SPM 70   Minutes 10   METs 2     Prescription Details   Frequency (times per week) 3   Duration Progress to 30 minutes of continuous aerobic without signs/symptoms of physical distress     Intensity   THRR 40-80% of Max Heartrate 61-122   Ratings of Perceived Exertion 11-13   Perceived Dyspnea 0-4     Progression   Progression Continue to progress workloads to maintain intensity without signs/symptoms of physical distress.     Resistance Training   Training Prescription Yes   Weight 2lbs   Reps 10-15      Perform Capillary Blood Glucose checks as needed.  Exercise Prescription Changes:      Exercise Prescription Changes    Row Name 08/08/16 0800 09/12/16 1100           Response to Exercise   Blood Pressure (Admit) 144/83 118/80      Blood Pressure (Exercise) 134/76 132/78      Blood Pressure (Exit) 110/80 134/90      Heart Rate (Admit) 66 bpm 76 bpm      Heart Rate (Exercise) 84 bpm 79 bpm      Heart Rate (Exit) 59 bpm 58 bpm      Rating of Perceived Exertion (Exercise) 13 11      Duration Progress to 45 minutes of aerobic exercise without signs/symptoms of physical distress Progress to 45 minutes of aerobic exercise without signs/symptoms of physical distress      Intensity THRR unchanged THRR unchanged        Progression   Progression Continue to progress workloads to maintain intensity without signs/symptoms of physical distress. Continue to progress workloads to maintain intensity without signs/symptoms  of physical distress.      Average METs 2.2 2.6        Resistance Training   Training  Prescription Yes Yes      Weight 2lbs 4lb      Reps 10-15 10-15        Treadmill   MPH 2.2 -      Grade 0 -      Minutes 10 -      METs 2.68 -        Bike   Level 0.5 0.5      Minutes 10 10      METs 2.26 2.26        NuStep   Level 3 4      SPM 70 70      Minutes 10 10      METs 2 3        Track   Laps  - 8      Minutes  - 10      METs  - 2.4         Exercise Comments:      Exercise Comments    Row Name 08/15/16 1700 09/17/16 1628         Exercise Comments Pt is doing well with exercise reviewed METs and goals with pt.          Exercise Goals and Review:      Exercise Goals    Row Name 07/26/16 0916             Exercise Goals   Increase Physical Activity Yes       Intervention Provide advice, education, support and counseling about physical activity/exercise needs.;Develop an individualized exercise prescription for aerobic and resistive training based on initial evaluation findings, risk stratification, comorbidities and participant's personal goals.       Expected Outcomes Achievement of increased cardiorespiratory fitness and enhanced flexibility, muscular endurance and strength shown through measurements of functional capacity and personal statement of participant.       Increase Strength and Stamina Yes  learn exercise and activity limitations and develop an exercise routine       Intervention Provide advice, education, support and counseling about physical activity/exercise needs.;Develop an individualized exercise prescription for aerobic and resistive training based on initial evaluation findings, risk stratification, comorbidities and participant's personal goals.       Expected Outcomes Achievement of increased cardiorespiratory fitness and enhanced flexibility, muscular endurance and strength shown through measurements of functional capacity and personal statement of participant.          Exercise Goals Re-Evaluation :     Exercise Goals  Re-Evaluation    Row Name 08/15/16 1658 09/17/16 1629           Exercise Goal Re-Evaluation   Exercise Goals Review Increase Physical Activity;Increase Strenth and Stamina Increase Physical Activity;Increase Strenth and Stamina      Comments Pt states that he is enjoying exercising and he feels great Pt states that he feels great and he is enjoying exercise.  He is also walking some on is own outside of cardiac rehab      Expected Outcomes continue with exercise Rx and gradually increase workloads as tolerated in order to increase strength and stamina continue with exercise Rx and gradually increase workloads as tolerated in order to increase strength and stamina          Discharge Exercise Prescription (Final Exercise Prescription Changes):     Exercise Prescription Changes -  09/12/16 1100      Response to Exercise   Blood Pressure (Admit) 118/80   Blood Pressure (Exercise) 132/78   Blood Pressure (Exit) 134/90   Heart Rate (Admit) 76 bpm   Heart Rate (Exercise) 79 bpm   Heart Rate (Exit) 58 bpm   Rating of Perceived Exertion (Exercise) 11   Duration Progress to 45 minutes of aerobic exercise without signs/symptoms of physical distress   Intensity THRR unchanged     Progression   Progression Continue to progress workloads to maintain intensity without signs/symptoms of physical distress.   Average METs 2.6     Resistance Training   Training Prescription Yes   Weight 4lb   Reps 10-15     Treadmill   MPH --   Grade --   Minutes --   METs --     Bike   Level 0.5   Minutes 10   METs 2.26     NuStep   Level 4   SPM 70   Minutes 10   METs 3     Track   Laps 8   Minutes 10   METs 2.4      Nutrition:  Target Goals: Understanding of nutrition guidelines, daily intake of sodium 1500mg , cholesterol 200mg , calories 30% from fat and 7% or less from saturated fats, daily to have 5 or more servings of fruits and vegetables.  Biometrics:     Pre Biometrics -  07/26/16 1229      Pre Biometrics   Waist Circumference 32.5 inches   Hip Circumference 37.5 inches   Waist to Hip Ratio 0.87 %   Triceps Skinfold 10 mm   % Body Fat 19.7 %   Grip Strength 41 kg   Flexibility 8.5 in   Single Leg Stand 6.09 seconds       Nutrition Therapy Plan and Nutrition Goals:   Nutrition Discharge: Nutrition Scores:   Nutrition Goals Re-Evaluation:   Nutrition Goals Re-Evaluation:   Nutrition Goals Discharge (Final Nutrition Goals Re-Evaluation):   Psychosocial: Target Goals: Acknowledge presence or absence of significant depression and/or stress, maximize coping skills, provide positive support system. Participant is able to verbalize types and ability to use techniques and skills needed for reducing stress and depression.  Initial Review & Psychosocial Screening:     Initial Psych Review & Screening - 07/26/16 1542      Initial Review   Current issues with None Identified     Family Dynamics   Good Support System? Yes     Barriers   Psychosocial barriers to participate in program There are no identifiable barriers or psychosocial needs.     Screening Interventions   Interventions Encouraged to exercise      Quality of Life Scores:     Quality of Life - 07/26/16 1226      Quality of Life Scores   Health/Function Pre 25.6 %   Socioeconomic Pre 26.57 %   Psych/Spiritual Pre 29.14 %   Family Pre 26.4 %   GLOBAL Pre 26.65 %      PHQ-9: Recent Review Flowsheet Data    Depression screen [facility] 2/9 11/01/2015   Decreased Interest 0   Down, Depressed, Hopeless 0   PHQ - 2 Score 0     Interpretation of Total Score  Total Score Depression Severity:  1-4 = Minimal depression, 5-9 = Mild depression, 10-14 = Moderate depression, 15-19 = Moderately severe depression, 20-27 = Severe depression   Psychosocial Evaluation and Intervention:  Psychosocial Re-Evaluation:     Psychosocial Re-Evaluation    [place] Name 08/21/16 1020 08/21/16  1105 09/18/16 1233         Psychosocial Re-Evaluation   Current issues with (P)  None Identified None Identified None Identified     Interventions (P)  Encouraged to attend Cardiac Rehabilitation for the exercise Encouraged to attend Cardiac Rehabilitation for the exercise Encouraged to attend Cardiac Rehabilitation for the exercise     Continue Psychosocial Services  (P)  No Follow up required No Follow up required No Follow up required        Psychosocial Discharge (Final Psychosocial Re-Evaluation):     Psychosocial Re-Evaluation - 09/18/16 1233      Psychosocial Re-Evaluation   Current issues with None Identified   Interventions Encouraged to attend Cardiac Rehabilitation for the exercise   Continue Psychosocial Services  No Follow up required      Vocational Rehabilitation: Provide vocational rehab assistance to qualifying candidates.   Vocational Rehab Evaluation & Intervention:     Vocational Rehab - 07/26/16 1543      Initial Vocational Rehab Evaluation & Intervention   Assessment shows need for Vocational Rehabilitation No      Education: Education Goals: Education classes will be provided on a weekly basis, covering required topics. Participant will state understanding/return demonstration of topics presented.  Learning Barriers/Preferences:     Learning Barriers/Preferences - 07/26/16 0916      Learning Barriers/Preferences   Learning Barriers Sight   Learning Preferences Video;Pictoral      Education Topics: Count Your Pulse:  -Group instruction provided by verbal instruction, demonstration, patient participation and written materials to support subject.  Instructors address importance of being able to find your pulse and how to count your pulse when at home without a heart monitor.  Patients get hands on experience counting their pulse with staff help and individually.   Heart Attack, Angina, and Risk Factor Modification:  -Group instruction  provided by verbal instruction, video, and written materials to support subject.  Instructors address signs and symptoms of angina and heart attacks.    Also discuss risk factors for heart disease and how to make changes to improve heart health risk factors.   Functional Fitness:  -Group instruction provided by verbal instruction, demonstration, patient participation, and written materials to support subject.  Instructors address safety measures for doing things around the house.  Discuss how to get up and down off the floor, how to pick things up properly, how to safely get out of a chair without assistance, and balance training.   Meditation and Mindfulness:  -Group instruction provided by verbal instruction, patient participation, and written materials to support subject.  Instructor addresses importance of mindfulness and meditation practice to help reduce stress and improve awareness.  Instructor also leads participants through a meditation exercise.    Stretching for Flexibility and Mobility:  -Group instruction provided by verbal instruction, patient participation, and written materials to support subject.  Instructors lead participants through series of stretches that are designed to increase flexibility thus improving mobility.  These stretches are additional exercise for major muscle groups that are typically performed during regular warm up and cool down.   Hands Only CPR:  -Group verbal, video, and participation provides a basic overview of AHA guidelines for community CPR. Role-play of emergencies allow participants the opportunity to practice calling for help and chest compression technique with discussion of AED use.   Hypertension: -Group verbal and written instruction that provides a basic  overview of hypertension including the most recent diagnostic guidelines, risk factor reduction with self-care instructions and medication management.    Nutrition I class: Heart Healthy  Eating:  -Group instruction provided by PowerPoint slides, verbal discussion, and written materials to support subject matter. The instructor gives an explanation and review of the Therapeutic Lifestyle Changes diet recommendations, which includes a discussion on lipid goals, dietary fat, sodium, fiber, plant stanol/sterol esters, sugar, and the components of a well-balanced, healthy diet.   Nutrition II class: Lifestyle Skills:  -Group instruction provided by PowerPoint slides, verbal discussion, and written materials to support subject matter. The instructor gives an explanation and review of label reading, grocery shopping for heart health, heart healthy recipe modifications, and ways to make healthier choices when eating out.   Diabetes Question & Answer:  -Group instruction provided by PowerPoint slides, verbal discussion, and written materials to support subject matter. The instructor gives an explanation and review of diabetes co-morbidities, pre- and post-prandial blood glucose goals, pre-exercise blood glucose goals, signs, symptoms, and treatment of hypoglycemia and hyperglycemia, and foot care basics.   Diabetes Blitz:  -Group instruction provided by PowerPoint slides, verbal discussion, and written materials to support subject matter. The instructor gives an explanation and review of the physiology behind type 1 and type 2 diabetes, diabetes medications and rational behind using different medications, pre- and post-prandial blood glucose recommendations and Hemoglobin A1c goals, diabetes diet, and exercise including blood glucose guidelines for exercising safely.    Portion Distortion:  -Group instruction provided by PowerPoint slides, verbal discussion, written materials, and food models to support subject matter. The instructor gives an explanation of serving size versus portion size, changes in portions sizes over the last 20 years, and what consists of a serving from each food  group.   Stress Management:  -Group instruction provided by verbal instruction, video, and written materials to support subject matter.  Instructors review role of stress in heart disease and how to cope with stress positively.     Exercising on Your Own:  -Group instruction provided by verbal instruction, power point, and written materials to support subject.  Instructors discuss benefits of exercise, components of exercise, frequency and intensity of exercise, and end points for exercise.  Also discuss use of nitroglycerin and activating EMS.  Review options of places to exercise outside of rehab.  Review guidelines for sex with heart disease.   Cardiac Drugs I:  -Group instruction provided by verbal instruction and written materials to support subject.  Instructor reviews cardiac drug classes: antiplatelets, anticoagulants, beta blockers, and statins.  Instructor discusses reasons, side effects, and lifestyle considerations for each drug class.   Cardiac Drugs II:  -Group instruction provided by verbal instruction and written materials to support subject.  Instructor reviews cardiac drug classes: angiotensin converting enzyme inhibitors (ACE-I), angiotensin II receptor blockers (ARBs), nitrates, and calcium channel blockers.  Instructor discusses reasons, side effects, and lifestyle considerations for each drug class.   Anatomy and Physiology of the Circulatory System:  Group verbal and written instruction and models provide basic cardiac anatomy and physiology, with the coronary electrical and arterial systems. Review of: AMI, Angina, Valve disease, Heart Failure, Peripheral Artery Disease, Cardiac Arrhythmia, Pacemakers, and the ICD.   Other Education:  -Group or individual verbal, written, or video instructions that support the educational goals of the cardiac rehab program.   Knowledge Questionnaire Score:     Knowledge Questionnaire Score - 07/26/16 1223      Knowledge  Questionnaire Score  Pre Score 15/24      Core Components/Risk Factors/Patient Goals at Admission:     Personal Goals and Risk Factors at Admission - 07/26/16 1541      Core Components/Risk Factors/Patient Goals on Admission   Tobacco Cessation Yes   Expected Outcomes Long Term: Complete abstinence from all tobacco products for at least 12 months from quit date.      Core Components/Risk Factors/Patient Goals Review:    Core Components/Risk Factors/Patient Goals at Discharge (Final Review):    ITP Comments:     ITP Comments    Row Name 07/26/16 0915           ITP Comments Medical Director, Dr. Fransico Him          Comments: Xzander is making expected progress toward personal goals after completing 12 sessions. Recommend continued exercise and life style modification education including  stress management and relaxation techniques to decrease cardiac risk profile. Teige is enjoying participating in phase 2 cardiac rehab twice a week.Barnet Pall, RN,BSN 09/19/2016 4:58 PM

## 2016-09-19 ENCOUNTER — Telehealth (HOSPITAL_COMMUNITY): Payer: Self-pay | Admitting: Internal Medicine

## 2016-09-19 ENCOUNTER — Encounter (HOSPITAL_COMMUNITY)
Admission: RE | Admit: 2016-09-19 | Discharge: 2016-09-19 | Disposition: A | Discharge status: Home / Self Care | Payer: Medicare Other | Source: Ambulatory Visit

## 2016-09-19 DIAGNOSIS — Z951 Presence of aortocoronary bypass graft: Secondary | ICD-10-CM

## 2016-09-21 ENCOUNTER — Encounter (HOSPITAL_COMMUNITY): Payer: Medicare Other

## 2016-09-24 ENCOUNTER — Encounter (HOSPITAL_COMMUNITY): Payer: Medicare Other

## 2016-09-26 ENCOUNTER — Encounter (HOSPITAL_COMMUNITY)
Admission: RE | Admit: 2016-09-26 | Discharge: 2016-09-26 | Disposition: A | Discharge status: Home / Self Care | Payer: Medicare Other | Source: Ambulatory Visit | Attending: Cardiovascular Disease | Admitting: Cardiovascular Disease

## 2016-09-26 DIAGNOSIS — Z87891 Personal history of nicotine dependence: Secondary | ICD-10-CM | POA: Diagnosis not present

## 2016-09-26 DIAGNOSIS — Z951 Presence of aortocoronary bypass graft: Secondary | ICD-10-CM

## 2016-09-26 DIAGNOSIS — Z79899 Other long term (current) drug therapy: Secondary | ICD-10-CM | POA: Diagnosis not present

## 2016-09-26 DIAGNOSIS — Z7982 Long term (current) use of aspirin: Secondary | ICD-10-CM | POA: Diagnosis not present

## 2016-09-28 ENCOUNTER — Encounter (HOSPITAL_COMMUNITY)
Admission: RE | Admit: 2016-09-28 | Discharge: 2016-09-28 | Disposition: A | Discharge status: Home / Self Care | Payer: Medicare Other | Source: Ambulatory Visit | Attending: Cardiovascular Disease | Admitting: Cardiovascular Disease

## 2016-09-28 DIAGNOSIS — Z7982 Long term (current) use of aspirin: Secondary | ICD-10-CM | POA: Diagnosis not present

## 2016-09-28 DIAGNOSIS — Z951 Presence of aortocoronary bypass graft: Secondary | ICD-10-CM | POA: Diagnosis not present

## 2016-09-28 DIAGNOSIS — Z79899 Other long term (current) drug therapy: Secondary | ICD-10-CM | POA: Diagnosis not present

## 2016-09-28 DIAGNOSIS — Z87891 Personal history of nicotine dependence: Secondary | ICD-10-CM | POA: Diagnosis not present

## 2016-10-01 ENCOUNTER — Encounter (HOSPITAL_COMMUNITY): Payer: Medicare Other

## 2016-10-05 ENCOUNTER — Encounter (HOSPITAL_COMMUNITY)
Admission: RE | Admit: 2016-10-05 | Discharge: 2016-10-05 | Disposition: A | Discharge status: Home / Self Care | Payer: Medicare Other | Source: Ambulatory Visit | Attending: Cardiovascular Disease | Admitting: Cardiovascular Disease

## 2016-10-05 DIAGNOSIS — Z87891 Personal history of nicotine dependence: Secondary | ICD-10-CM | POA: Diagnosis not present

## 2016-10-05 DIAGNOSIS — Z951 Presence of aortocoronary bypass graft: Secondary | ICD-10-CM | POA: Insufficient documentation

## 2016-10-05 DIAGNOSIS — Z7982 Long term (current) use of aspirin: Secondary | ICD-10-CM | POA: Diagnosis not present

## 2016-10-05 DIAGNOSIS — Z79899 Other long term (current) drug therapy: Secondary | ICD-10-CM | POA: Diagnosis not present

## 2016-10-08 ENCOUNTER — Encounter (HOSPITAL_COMMUNITY): Payer: Medicare Other

## 2016-10-10 ENCOUNTER — Encounter (HOSPITAL_COMMUNITY)
Admission: RE | Admit: 2016-10-10 | Discharge: 2016-10-10 | Disposition: A | Discharge status: Home / Self Care | Payer: Medicare Other | Source: Ambulatory Visit | Attending: Cardiovascular Disease | Admitting: Cardiovascular Disease

## 2016-10-10 DIAGNOSIS — Z87891 Personal history of nicotine dependence: Secondary | ICD-10-CM | POA: Diagnosis not present

## 2016-10-10 DIAGNOSIS — Z951 Presence of aortocoronary bypass graft: Secondary | ICD-10-CM

## 2016-10-10 DIAGNOSIS — Z7982 Long term (current) use of aspirin: Secondary | ICD-10-CM | POA: Diagnosis not present

## 2016-10-10 DIAGNOSIS — Z79899 Other long term (current) drug therapy: Secondary | ICD-10-CM | POA: Diagnosis not present

## 2016-10-10 NOTE — Progress Notes (Signed)
Christian Black 71 y.o. male Nutrition Note Spoke with pt.  Nutrition Survey reviewed with pt. Pt is following Step 1 of the Therapeutic Lifestyle Changes diet. Pt with dx of moderate degree of malnutrition 06/04/16, which is now resolved. Pt appetite and wt have both increased. Pt wt today is 74.3 kg, which is up 1.3 kg since admission.  Pt expressed understanding of the information reviewed. Pt aware of nutrition education classes offered and does not plan on attending nutrition classes. Pt declined nutrition class handouts at this time.   Lab Results  Component Value Date   HGBA1C 5.6 05/31/2016   Wt Readings from Last 3 Encounters:  07/26/16 160 lb 15 oz (73 kg)  07/05/16 164 lb (74.4 kg)  06/28/16 164 lb 1.6 oz (74.4 kg)   Nutrition Diagnosis ? Food-and nutrition-related knowledge deficit related to lack of exposure to information as related to diagnosis of: ? CVD  ? Moderate degree of malnutrition - resolved  Nutrition Intervention ? Benefits of adopting Therapeutic Lifestyle Changes discussed when Medficts reviewed. ? Pt to attend the Portion Distortion class ? Continue client-centered nutrition education by RD, as part of interdisciplinary care.  Goal(s) ? Pt to identify and limit food sources of saturated fat, trans fat, and sodium  Monitor and Evaluate progress toward nutrition goal with team.  Derek Mound, M.Ed, RD, LDN, CDE 10/10/2016 11:13 AM

## 2016-10-12 ENCOUNTER — Encounter (HOSPITAL_COMMUNITY)
Admission: RE | Admit: 2016-10-12 | Discharge: 2016-10-12 | Disposition: A | Discharge status: Home / Self Care | Payer: Medicare Other | Source: Ambulatory Visit | Attending: Cardiovascular Disease | Admitting: Cardiovascular Disease

## 2016-10-12 DIAGNOSIS — Z87891 Personal history of nicotine dependence: Secondary | ICD-10-CM | POA: Diagnosis not present

## 2016-10-12 DIAGNOSIS — Z79899 Other long term (current) drug therapy: Secondary | ICD-10-CM | POA: Diagnosis not present

## 2016-10-12 DIAGNOSIS — Z951 Presence of aortocoronary bypass graft: Secondary | ICD-10-CM | POA: Diagnosis not present

## 2016-10-12 DIAGNOSIS — Z7982 Long term (current) use of aspirin: Secondary | ICD-10-CM | POA: Diagnosis not present

## 2016-10-15 ENCOUNTER — Ambulatory Visit: Payer: Medicare Other | Admitting: Cardiothoracic Surgery

## 2016-10-15 ENCOUNTER — Encounter (HOSPITAL_COMMUNITY): Payer: Medicare Other

## 2016-10-16 NOTE — Progress Notes (Signed)
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  amiodarone (PACERONE) 200 MG tablet, Take 1 tablet (200 mg total) by mouth every 12 (twelve) hours. For 2 days then take Amiodarone 200 mg by mouth daily thereafter, Disp: 60 tablet, Rfl: 1 .  aspirin EC 81 MG tablet, Take 81 mg by mouth daily., Disp: , Rfl:  .  atorvastatin (LIPITOR) 80 MG tablet, Take 1 tablet (80 mg total) by mouth daily at 6 PM., Disp: 90 tablet, Rfl: 3 .  guaiFENesin (MUCINEX) 600 MG 12 hr tablet, Take 1 tablet (600 mg total) by mouth 2 (two) times daily as needed for cough or to loosen phlegm., Disp: , Rfl:  .  metoprolol succinate (TOPROL XL) 25 MG 24 hr tablet, Take 1 tablet (25 mg total) by mouth daily., Disp: 90 tablet, Rfl: 3 .  traMADol (ULTRAM) 50 MG tablet, TAKE 1 TABLET BY MOUTH EVERY 4 TO 6 HOURS AS NEEDED FOR PAIN, Disp: 30 tablet, Rfl: 0  Past Medical History: Past Medical History:  Diagnosis Date  . Colon cancer ([place])    colon mass  . Essential hypertension 05/23/2016  . Medical history non-contributory   . Tobacco abuse 05/23/2016    Tobacco Use: History  Smoking Status  . Former Smoker  . Packs/day: 0.50  . Years: 30.00  . Types: Cigarettes  . Quit date: 06/01/2016  Smokeless Tobacco  . Never Used    Labs: Recent Review Flowsheet Data    Labs for ITP Cardiac and Pulmonary Rehab Latest Ref Rng & Units 06/01/2016 06/01/2016 06/01/2016 06/02/2016 06/28/2016   Cholestrol 100 - 199 mg/dL - - - - 124   LDLCALC 0 - 99 mg/dL - - - - 51   HDL >39 mg/dL - - - - 66   Trlycerides 0 - 149 mg/dL - - - - 33   Hemoglobin A1c 4.8 - 5.6 % - - - - -   PHART 7.350 - 7.450 7.333(L) - 7.342(L) - -   PCO2ART 32.0 - 48.0 mmHg 47.3 - 49.6(H) - -   HCO3 20.0 - 28.0 mmol/L 25.2 - 26.8 - -   TCO2 0 - 100 mmol/L 27 27 28 31  -   ACIDBASEDEF 0.0 - 2.0 mmol/L 1.0 - - - -   O2SAT % 97.0 - 98.0 - -      Capillary Blood Glucose: Lab Results  Component Value Date   GLUCAP 121 (H) 06/07/2016   GLUCAP 129 (H) 06/06/2016   GLUCAP 96 06/06/2016   GLUCAP 92 06/06/2016   GLUCAP 108 (H) 06/05/2016     Exercise Target Goals:    Exercise Program Goal: Individual exercise prescription set with THRR, safety & activity barriers. Participant demonstrates ability to understand and report RPE using BORG scale, to self-measure pulse accurately, and to acknowledge the importance of the exercise prescription.  Exercise Prescription Goal: Starting with aerobic activity 30 plus minutes a day, 3 days per week for  initial exercise prescription. Provide home exercise prescription and guidelines that participant acknowledges understanding prior to discharge.  Activity Barriers & Risk Stratification:     Activity Barriers & Cardiac Risk Stratification - 07/26/16 0916      Activity Barriers & Cardiac Risk Stratification   Activity Barriers Muscular Weakness;Deconditioning   Cardiac Risk Stratification High      6 Minute Walk:     6 Minute Walk    Row Name 07/26/16 1228         6 Minute Walk   Phase Initial     Distance 1310 feet     Walk Time 6 minutes     # of Rest Breaks 0     MPH 2.48     METS 3.26     RPE 12     VO2 Peak 11.4     Symptoms No     Resting HR 58 bpm     Resting BP 125/79     Max Ex. HR 61 bpm     Max Ex. BP 150/86     2 Minute Post BP 147/86  132 78        Oxygen Initial Assessment:   Oxygen Re-Evaluation:   Oxygen Discharge (Final  Oxygen Re-Evaluation):   Initial Exercise Prescription:     Initial Exercise Prescription - 07/26/16 1200      Date of Initial Exercise RX and Referring Provider   Date 07/26/16   Referring Provider Skeet Latch MD     Treadmill   MPH 2.2   Grade 0   Minutes 10   METs 2.68     Bike   Level 0.5   Minutes 10   METs 2.26     NuStep   Level 3   SPM 70   Minutes 10   METs 2     Prescription Details   Frequency (times per week) 3   Duration Progress to 30 minutes of continuous aerobic without signs/symptoms of physical distress     Intensity   THRR 40-80% of Max Heartrate 61-122   Ratings of Perceived Exertion 11-13   Perceived Dyspnea 0-4     Progression   Progression Continue to progress workloads to maintain intensity without signs/symptoms of physical distress.     Resistance Training   Training Prescription Yes   Weight 2lbs   Reps 10-15      Perform Capillary Blood Glucose checks as needed.  Exercise Prescription Changes:     Exercise Prescription Changes    Row Name 08/08/16 0800 09/12/16 1100 10/11/16 0800         Response to Exercise   Blood Pressure (Admit) 144/83 118/80 112/72     Blood Pressure (Exercise) 134/76 132/78 132/64     Blood Pressure (Exit) 110/80 134/90 132/80     Heart Rate (Admit) 66 bpm 76 bpm 69 bpm     Heart Rate (Exercise) 84 bpm 79 bpm 83 bpm     Heart Rate (Exit) 59 bpm 58 bpm 64 bpm     Rating of Perceived Exertion (Exercise) 13 11 11      Duration Progress to 45 minutes of aerobic exercise without signs/symptoms of physical distress Progress to 45 minutes of aerobic exercise without signs/symptoms of physical distress Progress to 45 minutes of aerobic exercise without signs/symptoms of physical distress     Intensity THRR unchanged THRR unchanged THRR unchanged       Progression   Progression Continue to progress workloads to maintain intensity without  signs/symptoms of physical distress. Continue to progress  workloads to maintain intensity without signs/symptoms of physical distress. Continue to progress workloads to maintain intensity without signs/symptoms of physical distress.     Average METs 2.2 2.6 2.6       Resistance Training   Training Prescription Yes Yes Yes     Weight 2lbs 4lb 4lb     Reps 10-15 10-15 10-15       Treadmill   MPH 2.2 -  -     Grade 0 -  -     Minutes 10 -  -     METs 2.68 -  -       Bike   Level 0.5 0.5 0.5     Minutes 10 10 10      METs 2.26 2.26 2.26       NuStep   Level 3 4 5      SPM 70 70 70     Minutes 10 10 10      METs 2 3 2.6       Track   Laps  - 8 11     Minutes  - 10 10     METs  - 2.4 2.9       Home Exercise Plan   Plans to continue exercise at  -  - Home (comment)     Frequency  -  - Add 3 additional days to program exercise sessions.        Exercise Comments:     Exercise Comments    Row Name 08/15/16 1700 09/17/16 1628 10/11/16 0809       Exercise Comments Pt is doing well with exercise reviewed METs and goals with pt.  reviewed goals with pt.         Exercise Goals and Review:     Exercise Goals    Row Name 07/26/16 0916             Exercise Goals   Increase Physical Activity Yes       Intervention Provide advice, education, support and counseling about physical activity/exercise needs.;Develop an individualized exercise prescription for aerobic and resistive training based on initial evaluation findings, risk stratification, comorbidities and participant's personal goals.       Expected Outcomes Achievement of increased cardiorespiratory fitness and enhanced flexibility, muscular endurance and strength shown through measurements of functional capacity and personal statement of participant.       Increase Strength and Stamina Yes  learn exercise and activity limitations and develop an exercise routine       Intervention Provide advice, education, support and counseling about physical activity/exercise needs.;Develop  an individualized exercise prescription for aerobic and resistive training based on initial evaluation findings, risk stratification, comorbidities and participant's personal goals.       Expected Outcomes Achievement of increased cardiorespiratory fitness and enhanced flexibility, muscular endurance and strength shown through measurements of functional capacity and personal statement of participant.          Exercise Goals Re-Evaluation :     Exercise Goals Re-Evaluation    Row Name 08/15/16 1658 09/17/16 1629 10/11/16 0808         Exercise Goal Re-Evaluation   Exercise Goals Review Increase Physical Activity;Increase Strenth and Stamina Increase Physical Activity;Increase Strenth and Stamina Increase Physical Activity;Increase Strenth and Stamina     Comments Pt states that he is enjoying exercising and he feels great Pt states that he feels great and he is enjoying exercise.  He is also  walking some on is own outside of cardiac rehab Pt states he feels great and notices he is getting stronger in his legs. States he is walking almost everyday at home.      Expected Outcomes continue with exercise Rx and gradually increase workloads as tolerated in order to increase strength and stamina continue with exercise Rx and gradually increase workloads as tolerated in order to increase strength and stamina continue with exercise Rx and gradually increase workloads as tolerated in order to increase strength and stamina         Discharge Exercise Prescription (Final Exercise Prescription Changes):     Exercise Prescription Changes - 10/11/16 0800      Response to Exercise   Blood Pressure (Admit) 112/72   Blood Pressure (Exercise) 132/64   Blood Pressure (Exit) 132/80   Heart Rate (Admit) 69 bpm   Heart Rate (Exercise) 83 bpm   Heart Rate (Exit) 64 bpm   Rating of Perceived Exertion (Exercise) 11   Duration Progress to 45 minutes of aerobic exercise without signs/symptoms of physical  distress   Intensity THRR unchanged     Progression   Progression Continue to progress workloads to maintain intensity without signs/symptoms of physical distress.   Average METs 2.6     Resistance Training   Training Prescription Yes   Weight 4lb   Reps 10-15     Bike   Level 0.5   Minutes 10   METs 2.26     NuStep   Level 5   SPM 70   Minutes 10   METs 2.6     Track   Laps 11   Minutes 10   METs 2.9     Home Exercise Plan   Plans to continue exercise at Home (comment)   Frequency Add 3 additional days to program exercise sessions.      Nutrition:  Target Goals: Understanding of nutrition guidelines, daily intake of sodium 1500mg , cholesterol 200mg , calories 30% from fat and 7% or less from saturated fats, daily to have 5 or more servings of fruits and vegetables.  Biometrics:     Pre Biometrics - 07/26/16 1229      Pre Biometrics   Waist Circumference 32.5 inches   Hip Circumference 37.5 inches   Waist to Hip Ratio 0.87 %   Triceps Skinfold 10 mm   % Body Fat 19.7 %   Grip Strength 41 kg   Flexibility 8.5 in   Single Leg Stand 6.09 seconds       Nutrition Therapy Plan and Nutrition Goals:     Nutrition Therapy & Goals - 10/10/16 1116      Nutrition Therapy   Diet Therapeutic Lifestyle Changes     Personal Nutrition Goals   Nutrition Goal Pt to identify and limit food sources of saturated fat, trans fat, and sodium     Intervention Plan   Intervention Prescribe, educate and counsel regarding individualized specific dietary modifications aiming towards targeted core components such as weight, hypertension, lipid management, diabetes, heart failure and other comorbidities.   Expected Outcomes Short Term Goal: Understand basic principles of dietary content, such as calories, fat, sodium, cholesterol and nutrients.;Long Term Goal: Adherence to prescribed nutrition plan.      Nutrition Discharge: Nutrition Scores:     Nutrition Assessments -  10/10/16 1116      MEDFICTS Scores   Pre Score 48      Nutrition Goals Re-Evaluation:   Nutrition Goals Re-Evaluation:   Nutrition Goals Discharge (  Final Nutrition Goals Re-Evaluation):   Psychosocial: Target Goals: Acknowledge presence or absence of significant depression and/or stress, maximize coping skills, provide positive support system. Participant is able to verbalize types and ability to use techniques and skills needed for reducing stress and depression.  Initial Review & Psychosocial Screening:     Initial Psych Review & Screening - 07/26/16 1542      Initial Review   Current issues with None Identified     Family Dynamics   Good Support System? Yes     Barriers   Psychosocial barriers to participate in program There are no identifiable barriers or psychosocial needs.     Screening Interventions   Interventions Encouraged to exercise      Quality of Life Scores:     Quality of Life - 07/26/16 1226      Quality of Life Scores   Health/Function Pre 25.6 %   Socioeconomic Pre 26.57 %   Psych/Spiritual Pre 29.14 %   Family Pre 26.4 %   GLOBAL Pre 26.65 %      PHQ-9: Recent Review Flowsheet Data    Depression screen [facility] 2/9 11/01/2015   Decreased Interest 0   Down, Depressed, Hopeless 0   PHQ - 2 Score 0     Interpretation of Total Score  Total Score Depression Severity:  1-4 = Minimal depression, 5-9 = Mild depression, 10-14 = Moderate depression, 15-19 = Moderately severe depression, 20-27 = Severe depression   Psychosocial Evaluation and Intervention:   Psychosocial Re-Evaluation:     Psychosocial Re-Evaluation    [place] Name 08/21/16 1020 08/21/16 1105 09/18/16 1233 10/16/16 1202       Psychosocial Re-Evaluation   Current issues with (P)  None Identified None Identified None Identified None Identified    Interventions (P)  Encouraged to attend Cardiac Rehabilitation for the exercise Encouraged to attend Cardiac Rehabilitation for the  exercise Encouraged to attend Cardiac Rehabilitation for the exercise Encouraged to attend Cardiac Rehabilitation for the exercise    Continue Psychosocial Services  (P)  No Follow up required No Follow up required No Follow up required No Follow up required       Psychosocial Discharge (Final Psychosocial Re-Evaluation):     Psychosocial Re-Evaluation - 10/16/16 1202      Psychosocial Re-Evaluation   Current issues with None Identified   Interventions Encouraged to attend Cardiac Rehabilitation for the exercise   Continue Psychosocial Services  No Follow up required      Vocational Rehabilitation: Provide vocational rehab assistance to qualifying candidates.   Vocational Rehab Evaluation & Intervention:     Vocational Rehab - 07/26/16 1543      Initial Vocational Rehab Evaluation & Intervention   Assessment shows need for Vocational Rehabilitation No      Education: Education Goals: Education classes will be provided on a weekly basis, covering required topics. Participant will state understanding/return demonstration of topics presented.  Learning Barriers/Preferences:     Learning Barriers/Preferences - 07/26/16 0916      Learning Barriers/Preferences   Learning Barriers Sight   Learning Preferences Video;Pictoral      Education Topics: Count Your Pulse:  -Group instruction provided by verbal instruction, demonstration, patient participation and written materials to support subject.  Instructors address importance of being able to find your pulse and how to count your pulse when at home without a heart monitor.  Patients get hands on experience counting their pulse with staff help and individually.   Heart Attack, Angina, and Risk Factor  Modification:  -Group instruction provided by verbal instruction, video, and written materials to support subject.  Instructors address signs and symptoms of angina and heart attacks.    Also discuss risk factors for heart disease  and how to make changes to improve heart health risk factors.   Functional Fitness:  -Group instruction provided by verbal instruction, demonstration, patient participation, and written materials to support subject.  Instructors address safety measures for doing things around the house.  Discuss how to get up and down off the floor, how to pick things up properly, how to safely get out of a chair without assistance, and balance training.   Meditation and Mindfulness:  -Group instruction provided by verbal instruction, patient participation, and written materials to support subject.  Instructor addresses importance of mindfulness and meditation practice to help reduce stress and improve awareness.  Instructor also leads participants through a meditation exercise.    Stretching for Flexibility and Mobility:  -Group instruction provided by verbal instruction, patient participation, and written materials to support subject.  Instructors lead participants through series of stretches that are designed to increase flexibility thus improving mobility.  These stretches are additional exercise for major muscle groups that are typically performed during regular warm up and cool down.   Hands Only CPR:  -Group verbal, video, and participation provides a basic overview of AHA guidelines for community CPR. Role-play of emergencies allow participants the opportunity to practice calling for help and chest compression technique with discussion of AED use.   Hypertension: -Group verbal and written instruction that provides a basic overview of hypertension including the most recent diagnostic guidelines, risk factor reduction with self-care instructions and medication management.    Nutrition I class: Heart Healthy Eating:  -Group instruction provided by PowerPoint slides, verbal discussion, and written materials to support subject matter. The instructor gives an explanation and review of the Therapeutic  Lifestyle Changes diet recommendations, which includes a discussion on lipid goals, dietary fat, sodium, fiber, plant stanol/sterol esters, sugar, and the components of a well-balanced, healthy diet.   Nutrition II class: Lifestyle Skills:  -Group instruction provided by PowerPoint slides, verbal discussion, and written materials to support subject matter. The instructor gives an explanation and review of label reading, grocery shopping for heart health, heart healthy recipe modifications, and ways to make healthier choices when eating out.   Diabetes Question & Answer:  -Group instruction provided by PowerPoint slides, verbal discussion, and written materials to support subject matter. The instructor gives an explanation and review of diabetes co-morbidities, pre- and post-prandial blood glucose goals, pre-exercise blood glucose goals, signs, symptoms, and treatment of hypoglycemia and hyperglycemia, and foot care basics.   Diabetes Blitz:  -Group instruction provided by PowerPoint slides, verbal discussion, and written materials to support subject matter. The instructor gives an explanation and review of the physiology behind type 1 and type 2 diabetes, diabetes medications and rational behind using different medications, pre- and post-prandial blood glucose recommendations and Hemoglobin A1c goals, diabetes diet, and exercise including blood glucose guidelines for exercising safely.    Portion Distortion:  -Group instruction provided by PowerPoint slides, verbal discussion, written materials, and food models to support subject matter. The instructor gives an explanation of serving size versus portion size, changes in portions sizes over the last 20 years, and what consists of a serving from each food group.   Stress Management:  -Group instruction provided by verbal instruction, video, and written materials to support subject matter.  Instructors review role of stress in  heart disease and how  to cope with stress positively.     Exercising on Your Own:  -Group instruction provided by verbal instruction, power point, and written materials to support subject.  Instructors discuss benefits of exercise, components of exercise, frequency and intensity of exercise, and end points for exercise.  Also discuss use of nitroglycerin and activating EMS.  Review options of places to exercise outside of rehab.  Review guidelines for sex with heart disease.   Cardiac Drugs I:  -Group instruction provided by verbal instruction and written materials to support subject.  Instructor reviews cardiac drug classes: antiplatelets, anticoagulants, beta blockers, and statins.  Instructor discusses reasons, side effects, and lifestyle considerations for each drug class.   Cardiac Drugs II:  -Group instruction provided by verbal instruction and written materials to support subject.  Instructor reviews cardiac drug classes: angiotensin converting enzyme inhibitors (ACE-I), angiotensin II receptor blockers (ARBs), nitrates, and calcium channel blockers.  Instructor discusses reasons, side effects, and lifestyle considerations for each drug class.   Anatomy and Physiology of the Circulatory System:  Group verbal and written instruction and models provide basic cardiac anatomy and physiology, with the coronary electrical and arterial systems. Review of: AMI, Angina, Valve disease, Heart Failure, Peripheral Artery Disease, Cardiac Arrhythmia, Pacemakers, and the ICD.   Other Education:  -Group or individual verbal, written, or video instructions that support the educational goals of the cardiac rehab program.   Knowledge Questionnaire Score:     Knowledge Questionnaire Score - 07/26/16 1223      Knowledge Questionnaire Score   Pre Score 15/24      Core Components/Risk Factors/Patient Goals at Admission:     Personal Goals and Risk Factors at Admission - 07/26/16 1541      Core Components/Risk  Factors/Patient Goals on Admission   Tobacco Cessation Yes   Expected Outcomes Long Term: Complete abstinence from all tobacco products for at least 12 months from quit date.      Core Components/Risk Factors/Patient Goals Review:    Core Components/Risk Factors/Patient Goals at Discharge (Final Review):    ITP Comments:     ITP Comments    Row Name 07/26/16 0915           ITP Comments Medical Director, Dr. Fransico Him          Comments: Thiago is making expected progress toward personal goals after completing 17 sessions. Recommend continued exercise and life style modification education including  stress management and relaxation techniques to decrease cardiac risk profile. Kollyn continues to well with exercise twice a week.Barnet Pall, RN,BSN 10/16/2016 12:28 PM

## 2016-10-17 ENCOUNTER — Telehealth (HOSPITAL_COMMUNITY): Payer: Self-pay | Admitting: Internal Medicine

## 2016-10-17 ENCOUNTER — Encounter (HOSPITAL_COMMUNITY)
Admission: RE | Admit: 2016-10-17 | Discharge: 2016-10-17 | Disposition: A | Discharge status: Home / Self Care | Payer: Medicare Other | Source: Ambulatory Visit

## 2016-10-17 DIAGNOSIS — Z951 Presence of aortocoronary bypass graft: Secondary | ICD-10-CM

## 2016-10-19 ENCOUNTER — Encounter (HOSPITAL_COMMUNITY): Payer: Medicare Other

## 2016-10-22 ENCOUNTER — Encounter (HOSPITAL_COMMUNITY): Payer: Medicare Other

## 2016-10-24 ENCOUNTER — Telehealth: Payer: Self-pay | Admitting: *Deleted

## 2016-10-24 ENCOUNTER — Telehealth (HOSPITAL_COMMUNITY): Payer: Self-pay | Admitting: Internal Medicine

## 2016-10-24 ENCOUNTER — Encounter (HOSPITAL_COMMUNITY): Payer: Medicare Other

## 2016-10-24 NOTE — Telephone Encounter (Signed)
Received call from [facility] requesting gout medicine for pt.   Pt has a flare up of gout at present.  Dr. Burr Medico notified. Spoke with Mliss Sax, and informed her to notify pt's PCP for gout medication as per Dr. Ernestina Penna instructions. [facility]   Phone      [phone].
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
## 2016-10-26 ENCOUNTER — Telehealth (HOSPITAL_COMMUNITY): Payer: Self-pay | Admitting: Internal Medicine

## 2016-10-26 ENCOUNTER — Encounter: Payer: Self-pay | Admitting: Hematology

## 2016-10-26 ENCOUNTER — Encounter (HOSPITAL_COMMUNITY): Admission: RE | Admit: 2016-10-26 | Payer: Medicare Other | Source: Ambulatory Visit

## 2016-10-26 ENCOUNTER — Ambulatory Visit (HOSPITAL_BASED_OUTPATIENT_CLINIC_OR_DEPARTMENT_OTHER): Payer: Medicare Other | Admitting: Hematology

## 2016-10-26 ENCOUNTER — Other Ambulatory Visit (HOSPITAL_BASED_OUTPATIENT_CLINIC_OR_DEPARTMENT_OTHER): Payer: Medicare Other

## 2016-10-26 VITALS — BP 184/77 | HR 76 | Resp 18 | Ht 73.0 in | Wt 160.6 lb

## 2016-10-26 DIAGNOSIS — I10 Essential (primary) hypertension: Secondary | ICD-10-CM

## 2016-10-26 DIAGNOSIS — C182 Malignant neoplasm of ascending colon: Secondary | ICD-10-CM

## 2016-10-26 DIAGNOSIS — Z951 Presence of aortocoronary bypass graft: Secondary | ICD-10-CM

## 2016-10-26 LAB — COMPREHENSIVE METABOLIC PANEL
ALBUMIN: 3.5 g/dL (ref 3.5–5.0)
ALK PHOS: 76 U/L (ref 40–150)
ALT: 14 U/L (ref 0–55)
ANION GAP: 9 meq/L (ref 3–11)
AST: 22 U/L (ref 5–34)
BILIRUBIN TOTAL: 0.59 mg/dL (ref 0.20–1.20)
BUN: 14.1 mg/dL (ref 7.0–26.0)
CALCIUM: 9.3 mg/dL (ref 8.4–10.4)
CO2: 27 mEq/L (ref 22–29)
Chloride: 103 mEq/L (ref 98–109)
Creatinine: 1.5 mg/dL — ABNORMAL HIGH (ref 0.7–1.3)
EGFR: 53 mL/min/{1.73_m2} — AB (ref >90)
Glucose: 130 mg/dl (ref 70–140)
Potassium: 3.4 mEq/L — ABNORMAL LOW (ref 3.5–5.1)
Sodium: 139 mEq/L (ref 136–145)
TOTAL PROTEIN: 7.6 g/dL (ref 6.4–8.3)

## 2016-10-26 LAB — CBC WITH DIFFERENTIAL/PLATELET
BASO%: 0.6 % (ref 0.0–2.0)
BASOS ABS: 0 10*3/uL (ref 0.0–0.1)
EOS ABS: 0.2 10*3/uL (ref 0.0–0.5)
EOS%: 3.7 % (ref 0.0–7.0)
HCT: 41.3 % (ref 38.4–49.9)
HGB: 13.3 g/dL (ref 13.0–17.1)
LYMPH%: 28.8 % (ref 14.0–49.0)
MCH: 26.8 pg — AB (ref 27.2–33.4)
MCHC: 32.2 g/dL (ref 32.0–36.0)
MCV: 83.1 fL (ref 79.3–98.0)
MONO#: 0.5 10*3/uL (ref 0.1–0.9)
MONO%: 9.4 % (ref 0.0–14.0)
NEUT%: 57.5 % (ref 39.0–75.0)
NEUTROS ABS: 2.9 10*3/uL (ref 1.5–6.5)
Platelets: 193 10*3/uL (ref 140–400)
RBC: 4.97 10*6/uL (ref 4.20–5.82)
RDW: 20.1 % — AB (ref 11.0–14.6)
WBC: 5 10*3/uL (ref 4.0–10.3)
lymph#: 1.4 10*3/uL (ref 0.9–3.3)

## 2016-10-26 LAB — CEA (IN HOUSE-CHCC)

## 2016-10-29 ENCOUNTER — Encounter (HOSPITAL_COMMUNITY): Payer: Medicare Other

## 2016-10-31 ENCOUNTER — Encounter (HOSPITAL_COMMUNITY)
Admission: RE | Admit: 2016-10-31 | Discharge: 2016-10-31 | Disposition: A | Discharge status: Home / Self Care | Payer: Medicare Other | Source: Ambulatory Visit | Attending: Cardiovascular Disease | Admitting: Cardiovascular Disease

## 2016-10-31 DIAGNOSIS — Z7982 Long term (current) use of aspirin: Secondary | ICD-10-CM | POA: Diagnosis not present

## 2016-10-31 DIAGNOSIS — Z951 Presence of aortocoronary bypass graft: Secondary | ICD-10-CM

## 2016-10-31 DIAGNOSIS — Z87891 Personal history of nicotine dependence: Secondary | ICD-10-CM | POA: Insufficient documentation

## 2016-10-31 DIAGNOSIS — Z79899 Other long term (current) drug therapy: Secondary | ICD-10-CM | POA: Insufficient documentation

## 2016-11-02 ENCOUNTER — Encounter (HOSPITAL_COMMUNITY): Payer: Medicare Other

## 2016-11-05 ENCOUNTER — Encounter (HOSPITAL_COMMUNITY): Payer: Medicare Other

## 2016-11-07 ENCOUNTER — Encounter (HOSPITAL_COMMUNITY)
Admission: RE | Admit: 2016-11-07 | Discharge: 2016-11-07 | Disposition: A | Discharge status: Home / Self Care | Payer: Medicare Other | Source: Ambulatory Visit | Attending: Cardiovascular Disease | Admitting: Cardiovascular Disease

## 2016-11-07 DIAGNOSIS — Z951 Presence of aortocoronary bypass graft: Secondary | ICD-10-CM | POA: Diagnosis not present

## 2016-11-07 DIAGNOSIS — Z7982 Long term (current) use of aspirin: Secondary | ICD-10-CM | POA: Diagnosis not present

## 2016-11-07 DIAGNOSIS — Z87891 Personal history of nicotine dependence: Secondary | ICD-10-CM | POA: Diagnosis not present

## 2016-11-07 DIAGNOSIS — Z79899 Other long term (current) drug therapy: Secondary | ICD-10-CM | POA: Diagnosis not present

## 2016-11-09 ENCOUNTER — Encounter (HOSPITAL_COMMUNITY)
Admission: RE | Admit: 2016-11-09 | Discharge: 2016-11-09 | Disposition: A | Discharge status: Home / Self Care | Payer: Medicare Other | Source: Ambulatory Visit | Attending: Cardiovascular Disease | Admitting: Cardiovascular Disease

## 2016-11-09 DIAGNOSIS — Z7982 Long term (current) use of aspirin: Secondary | ICD-10-CM | POA: Diagnosis not present

## 2016-11-09 DIAGNOSIS — Z951 Presence of aortocoronary bypass graft: Secondary | ICD-10-CM | POA: Diagnosis not present

## 2016-11-09 DIAGNOSIS — Z87891 Personal history of nicotine dependence: Secondary | ICD-10-CM | POA: Diagnosis not present

## 2016-11-09 DIAGNOSIS — Z79899 Other long term (current) drug therapy: Secondary | ICD-10-CM | POA: Diagnosis not present

## 2016-11-12 ENCOUNTER — Encounter (HOSPITAL_COMMUNITY): Payer: Medicare Other

## 2016-11-14 ENCOUNTER — Encounter (HOSPITAL_COMMUNITY)
Admission: RE | Admit: 2016-11-14 | Discharge: 2016-11-14 | Disposition: A | Discharge status: Home / Self Care | Payer: Medicare Other | Source: Ambulatory Visit

## 2016-11-14 DIAGNOSIS — Z951 Presence of aortocoronary bypass graft: Secondary | ICD-10-CM
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
## 2016-11-14 NOTE — Progress Notes (Signed)
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  amiodarone (PACERONE) 200 MG tablet, Take 1 tablet (200 mg total) by mouth every 12 (twelve) hours. For 2 days then take Amiodarone 200 mg by mouth daily thereafter, Disp: 60 tablet, Rfl: 1 .  aspirin EC 81 MG tablet, Take 81 mg by mouth daily., Disp: , Rfl:  .  atorvastatin (LIPITOR) 80 MG tablet, Take 1 tablet (80 mg total) by mouth daily at 6 PM. (Patient not taking: Reported on 10/26/2016), Disp: 90 tablet, Rfl: 3 .  guaiFENesin (MUCINEX) 600 MG 12 hr tablet, Take 1 tablet (600 mg total) by mouth 2 (two) times daily as needed for cough or to loosen phlegm., Disp: , Rfl:  .  metoprolol succinate (TOPROL XL) 25 MG 24 hr tablet, Take 1 tablet (25 mg total) by mouth daily., Disp: 90 tablet, Rfl: 3 .  traMADol (ULTRAM) 50 MG tablet, TAKE 1 TABLET BY MOUTH EVERY 4 TO 6 HOURS AS NEEDED FOR PAIN (Patient not taking: Reported on 10/26/2016), Disp: 30 tablet, Rfl: 0  Past Medical History: Past Medical History:  Diagnosis Date  . Colon cancer ([place])    colon mass  . Essential hypertension 05/23/2016  . Medical history non-contributory   . Tobacco abuse 05/23/2016    Tobacco Use: History  Smoking Status  . Former Smoker  . Packs/day: 0.50  . Years: 30.00  . Types: Cigarettes  . Quit date: 06/01/2016  Smokeless Tobacco  . Never Used    Labs: Recent Review Flowsheet Data    Labs for ITP Cardiac and  Pulmonary Rehab Latest Ref Rng & Units 06/01/2016 06/01/2016 06/01/2016 06/02/2016 06/28/2016   Cholestrol 100 - 199 mg/dL - - - - 124   LDLCALC 0 - 99 mg/dL - - - - 51   HDL >39 mg/dL - - - - 66   Trlycerides 0 - 149 mg/dL - - - - 33   Hemoglobin A1c 4.8 - 5.6 % - - - - -   PHART 7.350 - 7.450 7.333(L) - 7.342(L) - -   PCO2ART 32.0 - 48.0 mmHg 47.3 - 49.6(H) - -   HCO3 20.0 - 28.0 mmol/L 25.2 - 26.8 - -   TCO2 0 - 100 mmol/L 27 27 28 31  -   ACIDBASEDEF 0.0 - 2.0 mmol/L 1.0 - - - -   O2SAT % 97.0 - 98.0 - -      Capillary Blood Glucose: Lab Results  Component Value Date   GLUCAP 121 (H) 06/07/2016   GLUCAP 129 (H) 06/06/2016   GLUCAP 96 06/06/2016   GLUCAP 92 06/06/2016   GLUCAP 108 (H) 06/05/2016     Exercise Target Goals:    Exercise Program Goal: Individual exercise prescription set with THRR, safety & activity barriers. Participant demonstrates ability to understand and report RPE using BORG scale, to self-measure pulse accurately, and to acknowledge the importance of the exercise prescription.  Exercise Prescription Goal: Starting  with aerobic activity 30 plus minutes a day, 3 days per week for initial exercise prescription. Provide home exercise prescription and guidelines that participant acknowledges understanding prior to discharge.  Activity Barriers & Risk Stratification:     Activity Barriers & Cardiac Risk Stratification - 07/26/16 0916      Activity Barriers & Cardiac Risk Stratification   Activity Barriers Muscular Weakness;Deconditioning   Cardiac Risk Stratification High      6 Minute Walk:     6 Minute Walk    Row Name 07/26/16 1228         6 Minute Walk   Phase Initial     Distance 1310 feet     Walk Time 6 minutes     # of Rest Breaks 0     MPH 2.48     METS 3.26     RPE 12     VO2 Peak 11.4     Symptoms No     Resting HR 58 bpm     Resting BP 125/79     Max Ex. HR 61 bpm     Max Ex. BP 150/86     2 Minute Post BP 147/86  132 78         Oxygen Initial Assessment:   Oxygen Re-Evaluation:   Oxygen Discharge (Final Oxygen Re-Evaluation):   Initial Exercise Prescription:     Initial Exercise Prescription - 07/26/16 1200      Date of Initial Exercise RX and Referring Provider   Date 07/26/16   Referring Provider Skeet Latch MD     Treadmill   MPH 2.2   Grade 0   Minutes 10   METs 2.68     Bike   Level 0.5   Minutes 10   METs 2.26     NuStep   Level 3   SPM 70   Minutes 10   METs 2     Prescription Details   Frequency (times per week) 3   Duration Progress to 30 minutes of continuous aerobic without signs/symptoms of physical distress     Intensity   THRR 40-80% of Max Heartrate 61-122   Ratings of Perceived Exertion 11-13   Perceived Dyspnea 0-4     Progression   Progression Continue to progress workloads to maintain intensity without signs/symptoms of physical distress.     Resistance Training   Training Prescription Yes   Weight 2lbs   Reps 10-15      Perform Capillary Blood Glucose checks as needed.  Exercise Prescription Changes:     Exercise Prescription Changes    Row Name 08/08/16 0800 09/12/16 1100 10/11/16 0800 11/08/16 1600       Response to Exercise   Blood Pressure (Admit) 144/83 118/80 112/72 120/80    Blood Pressure (Exercise) 134/76 132/78 132/64 134/80    Blood Pressure (Exit) 110/80 134/90 132/80 130/80    Heart Rate (Admit) 66 bpm 76 bpm 69 bpm 62 bpm    Heart Rate (Exercise) 84 bpm 79 bpm 83 bpm 68 bpm    Heart Rate (Exit) 59 bpm 58 bpm 64 bpm 55 bpm    Rating of Perceived Exertion (Exercise) 13 11 11 11     Duration Progress to 45 minutes of aerobic exercise without signs/symptoms of physical distress Progress to 45 minutes of aerobic exercise without signs/symptoms of physical distress Progress to 45 minutes of aerobic exercise without signs/symptoms of physical distress Progress to 45 minutes of aerobic exercise without signs/symptoms of physical  distress  Intensity THRR unchanged THRR unchanged THRR unchanged THRR unchanged      Progression   Progression Continue to progress workloads to maintain intensity without signs/symptoms of physical distress. Continue to progress workloads to maintain intensity without signs/symptoms of physical distress. Continue to progress workloads to maintain intensity without signs/symptoms of physical distress. Continue to progress workloads to maintain intensity without signs/symptoms of physical distress.    Average METs 2.2 2.6 2.6 2.9      Resistance Training   Training Prescription Yes Yes Yes Yes    Weight 2lbs 4lb 4lb 4lb    Reps 10-15 10-15 10-15 10-15      Treadmill   MPH 2.2 -  -  -    Grade 0 -  -  -    Minutes 10 -  -  -    METs 2.68 -  -  -      Bike   Level 0.5 0.5 0.5 0.5    Minutes 10 10 10 10     METs 2.26 2.26 2.26 2.26      NuStep   Level 3 4 5 5     SPM 70 70 70 70    Minutes 10 10 10 10     METs 2 3 2.6 2.6      Track   Laps  - 8 11 14     Minutes  - 10 10 10     METs  - 2.4 2.9 3.44      Home Exercise Plan   Plans to continue exercise at  -  - Home (comment) Home (comment)    Frequency  -  - Add 3 additional days to program exercise sessions. Add 3 additional days to program exercise sessions.       Exercise Comments:     Exercise Comments    Row Name 08/15/16 1700 09/17/16 1628 10/11/16 0809 11/08/16 1607     Exercise Comments Pt is doing well with exercise reviewed METs and goals with pt.  reviewed goals with pt.  reviewed goals with pt.        Exercise Goals and Review:     Exercise Goals    Row Name 07/26/16 0916             Exercise Goals   Increase Physical Activity Yes       Intervention Provide advice, education, support and counseling about physical activity/exercise needs.;Develop an individualized exercise prescription for aerobic and resistive training based on initial evaluation findings, risk stratification, comorbidities and  participant's personal goals.       Expected Outcomes Achievement of increased cardiorespiratory fitness and enhanced flexibility, muscular endurance and strength shown through measurements of functional capacity and personal statement of participant.       Increase Strength and Stamina Yes  learn exercise and activity limitations and develop an exercise routine       Intervention Provide advice, education, support and counseling about physical activity/exercise needs.;Develop an individualized exercise prescription for aerobic and resistive training based on initial evaluation findings, risk stratification, comorbidities and participant's personal goals.       Expected Outcomes Achievement of increased cardiorespiratory fitness and enhanced flexibility, muscular endurance and strength shown through measurements of functional capacity and personal statement of participant.          Exercise Goals Re-Evaluation :     Exercise Goals Re-Evaluation    Row Name 08/15/16 1658 09/17/16 1629 10/11/16 0808 11/08/16 1607       Exercise Goal Re-Evaluation  Exercise Goals Review Increase Physical Activity;Increase Strenth and Stamina Increase Physical Activity;Increase Strenth and Stamina Increase Physical Activity;Increase Strenth and Stamina Increase Physical Activity;Increase Strenth and Stamina    Comments Pt states that he is enjoying exercising and he feels great Pt states that he feels great and he is enjoying exercise.  He is also walking some on is own outside of cardiac rehab Pt states he feels great and notices he is getting stronger in his legs. States he is walking almost everyday at home.  Pt states he feels great and notices he is getting stronger in his legs. States he is still walking almost everyday at home.     Expected Outcomes continue with exercise Rx and gradually increase workloads as tolerated in order to increase strength and stamina continue with exercise Rx and gradually increase  workloads as tolerated in order to increase strength and stamina continue with exercise Rx and gradually increase workloads as tolerated in order to increase strength and stamina continue with exercise Rx and gradually increase workloads as tolerated in order to increase strength and stamina        Discharge Exercise Prescription (Final Exercise Prescription Changes):     Exercise Prescription Changes - 11/08/16 1600      Response to Exercise   Blood Pressure (Admit) 120/80   Blood Pressure (Exercise) 134/80   Blood Pressure (Exit) 130/80   Heart Rate (Admit) 62 bpm   Heart Rate (Exercise) 68 bpm   Heart Rate (Exit) 55 bpm   Rating of Perceived Exertion (Exercise) 11   Duration Progress to 45 minutes of aerobic exercise without signs/symptoms of physical distress   Intensity THRR unchanged     Progression   Progression Continue to progress workloads to maintain intensity without signs/symptoms of physical distress.   Average METs 2.9     Resistance Training   Training Prescription Yes   Weight 4lb   Reps 10-15     Bike   Level 0.5   Minutes 10   METs 2.26     NuStep   Level 5   SPM 70   Minutes 10   METs 2.6     Track   Laps 14   Minutes 10   METs 3.44     Home Exercise Plan   Plans to continue exercise at Home (comment)   Frequency Add 3 additional days to program exercise sessions.      Nutrition:  Target Goals: Understanding of nutrition guidelines, daily intake of sodium 1500mg , cholesterol 200mg , calories 30% from fat and 7% or less from saturated fats, daily to have 5 or more servings of fruits and vegetables.  Biometrics:     Pre Biometrics - 07/26/16 1229      Pre Biometrics   Waist Circumference 32.5 inches   Hip Circumference 37.5 inches   Waist to Hip Ratio 0.87 %   Triceps Skinfold 10 mm   % Body Fat 19.7 %   Grip Strength 41 kg   Flexibility 8.5 in   Single Leg Stand 6.09 seconds       Nutrition Therapy Plan and Nutrition  Goals:     Nutrition Therapy & Goals - 10/10/16 1116      Nutrition Therapy   Diet Therapeutic Lifestyle Changes     Personal Nutrition Goals   Nutrition Goal Pt to identify and limit food sources of saturated fat, trans fat, and sodium     Intervention Plan   Intervention Prescribe, educate and counsel regarding individualized  specific dietary modifications aiming towards targeted core components such as weight, hypertension, lipid management, diabetes, heart failure and other comorbidities.   Expected Outcomes Short Term Goal: Understand basic principles of dietary content, such as calories, fat, sodium, cholesterol and nutrients.;Long Term Goal: Adherence to prescribed nutrition plan.      Nutrition Discharge: Nutrition Scores:     Nutrition Assessments - 10/10/16 1116      MEDFICTS Scores   Pre Score 48      Nutrition Goals Re-Evaluation:   Nutrition Goals Re-Evaluation:   Nutrition Goals Discharge (Final Nutrition Goals Re-Evaluation):   Psychosocial: Target Goals: Acknowledge presence or absence of significant depression and/or stress, maximize coping skills, provide positive support system. Participant is able to verbalize types and ability to use techniques and skills needed for reducing stress and depression.  Initial Review & Psychosocial Screening:     Initial Psych Review & Screening - 07/26/16 1542      Initial Review   Current issues with None Identified     Family Dynamics   Good Support System? Yes     Barriers   Psychosocial barriers to participate in program There are no identifiable barriers or psychosocial needs.     Screening Interventions   Interventions Encouraged to exercise      Quality of Life Scores:     Quality of Life - 07/26/16 1226      Quality of Life Scores   Health/Function Pre 25.6 %   Socioeconomic Pre 26.57 %   Psych/Spiritual Pre 29.14 %   Family Pre 26.4 %   GLOBAL Pre 26.65 %      PHQ-9: Recent Review  Flowsheet Data    Depression screen [facility] 2/9 11/01/2015   Decreased Interest 0   Down, Depressed, Hopeless 0   PHQ - 2 Score 0     Interpretation of Total Score  Total Score Depression Severity:  1-4 = Minimal depression, 5-9 = Mild depression, 10-14 = Moderate depression, 15-19 = Moderately severe depression, 20-27 = Severe depression   Psychosocial Evaluation and Intervention:   Psychosocial Re-Evaluation:     Psychosocial Re-Evaluation    [place] Name 08/21/16 1020 08/21/16 1105 09/18/16 1233 10/16/16 1202       Psychosocial Re-Evaluation   Current issues with (P)  None Identified None Identified None Identified None Identified    Interventions (P)  Encouraged to attend Cardiac Rehabilitation for the exercise Encouraged to attend Cardiac Rehabilitation for the exercise Encouraged to attend Cardiac Rehabilitation for the exercise Encouraged to attend Cardiac Rehabilitation for the exercise    Continue Psychosocial Services  (P)  No Follow up required No Follow up required No Follow up required No Follow up required       Psychosocial Discharge (Final Psychosocial Re-Evaluation):     Psychosocial Re-Evaluation - 10/16/16 1202      Psychosocial Re-Evaluation   Current issues with None Identified   Interventions Encouraged to attend Cardiac Rehabilitation for the exercise   Continue Psychosocial Services  No Follow up required      Vocational Rehabilitation: Provide vocational rehab assistance to qualifying candidates.   Vocational Rehab Evaluation & Intervention:     Vocational Rehab - 07/26/16 1543      Initial Vocational Rehab Evaluation & Intervention   Assessment shows need for Vocational Rehabilitation No      Education: Education Goals: Education classes will be provided on a weekly basis, covering required topics. Participant will state understanding/return demonstration of topics presented.  Learning Barriers/Preferences:  Learning Barriers/Preferences -  07/26/16 2500      Learning Barriers/Preferences   Learning Barriers Sight   Learning Preferences Video;Pictoral      Education Topics: Count Your Pulse:  -Group instruction provided by verbal instruction, demonstration, patient participation and written materials to support subject.  Instructors address importance of being able to find your pulse and how to count your pulse when at home without a heart monitor.  Patients get hands on experience counting their pulse with staff help and individually.   Heart Attack, Angina, and Risk Factor Modification:  -Group instruction provided by verbal instruction, video, and written materials to support subject.  Instructors address signs and symptoms of angina and heart attacks.    Also discuss risk factors for heart disease and how to make changes to improve heart health risk factors.   Functional Fitness:  -Group instruction provided by verbal instruction, demonstration, patient participation, and written materials to support subject.  Instructors address safety measures for doing things around the house.  Discuss how to get up and down off the floor, how to pick things up properly, how to safely get out of a chair without assistance, and balance training.   Meditation and Mindfulness:  -Group instruction provided by verbal instruction, patient participation, and written materials to support subject.  Instructor addresses importance of mindfulness and meditation practice to help reduce stress and improve awareness.  Instructor also leads participants through a meditation exercise.    Stretching for Flexibility and Mobility:  -Group instruction provided by verbal instruction, patient participation, and written materials to support subject.  Instructors lead participants through series of stretches that are designed to increase flexibility thus improving mobility.  These stretches are additional exercise for major muscle groups that are typically  performed during regular warm up and cool down.   Hands Only CPR:  -Group verbal, video, and participation provides a basic overview of AHA guidelines for community CPR. Role-play of emergencies allow participants the opportunity to practice calling for help and chest compression technique with discussion of AED use.   Hypertension: -Group verbal and written instruction that provides a basic overview of hypertension including the most recent diagnostic guidelines, risk factor reduction with self-care instructions and medication management.    Nutrition I class: Heart Healthy Eating:  -Group instruction provided by PowerPoint slides, verbal discussion, and written materials to support subject matter. The instructor gives an explanation and review of the Therapeutic Lifestyle Changes diet recommendations, which includes a discussion on lipid goals, dietary fat, sodium, fiber, plant stanol/sterol esters, sugar, and the components of a well-balanced, healthy diet.   Nutrition II class: Lifestyle Skills:  -Group instruction provided by PowerPoint slides, verbal discussion, and written materials to support subject matter. The instructor gives an explanation and review of label reading, grocery shopping for heart health, heart healthy recipe modifications, and ways to make healthier choices when eating out.   Diabetes Question & Answer:  -Group instruction provided by PowerPoint slides, verbal discussion, and written materials to support subject matter. The instructor gives an explanation and review of diabetes co-morbidities, pre- and post-prandial blood glucose goals, pre-exercise blood glucose goals, signs, symptoms, and treatment of hypoglycemia and hyperglycemia, and foot care basics.   Diabetes Blitz:  -Group instruction provided by PowerPoint slides, verbal discussion, and written materials to support subject matter. The instructor gives an explanation and review of the physiology behind  type 1 and type 2 diabetes, diabetes medications and rational behind using different medications, pre- and post-prandial blood glucose  recommendations and Hemoglobin A1c goals, diabetes diet, and exercise including blood glucose guidelines for exercising safely.    Portion Distortion:  -Group instruction provided by PowerPoint slides, verbal discussion, written materials, and food models to support subject matter. The instructor gives an explanation of serving size versus portion size, changes in portions sizes over the last 20 years, and what consists of a serving from each food group.   Stress Management:  -Group instruction provided by verbal instruction, video, and written materials to support subject matter.  Instructors review role of stress in heart disease and how to cope with stress positively.     Exercising on Your Own:  -Group instruction provided by verbal instruction, power point, and written materials to support subject.  Instructors discuss benefits of exercise, components of exercise, frequency and intensity of exercise, and end points for exercise.  Also discuss use of nitroglycerin and activating EMS.  Review options of places to exercise outside of rehab.  Review guidelines for sex with heart disease.   Cardiac Drugs I:  -Group instruction provided by verbal instruction and written materials to support subject.  Instructor reviews cardiac drug classes: antiplatelets, anticoagulants, beta blockers, and statins.  Instructor discusses reasons, side effects, and lifestyle considerations for each drug class.   Cardiac Drugs II:  -Group instruction provided by verbal instruction and written materials to support subject.  Instructor reviews cardiac drug classes: angiotensin converting enzyme inhibitors (ACE-I), angiotensin II receptor blockers (ARBs), nitrates, and calcium channel blockers.  Instructor discusses reasons, side effects, and lifestyle considerations for each drug  class.   Anatomy and Physiology of the Circulatory System:  Group verbal and written instruction and models provide basic cardiac anatomy and physiology, with the coronary electrical and arterial systems. Review of: AMI, Angina, Valve disease, Heart Failure, Peripheral Artery Disease, Cardiac Arrhythmia, Pacemakers, and the ICD.   Other Education:  -Group or individual verbal, written, or video instructions that support the educational goals of the cardiac rehab program.   Knowledge Questionnaire Score:     Knowledge Questionnaire Score - 07/26/16 1223      Knowledge Questionnaire Score   Pre Score 15/24      Core Components/Risk Factors/Patient Goals at Admission:     Personal Goals and Risk Factors at Admission - 07/26/16 1541      Core Components/Risk Factors/Patient Goals on Admission   Tobacco Cessation Yes   Expected Outcomes Long Term: Complete abstinence from all tobacco products for at least 12 months from quit date.      Core Components/Risk Factors/Patient Goals Review:    Core Components/Risk Factors/Patient Goals at Discharge (Final Review):    ITP Comments:     ITP Comments    Row Name 07/26/16 0915           ITP Comments Medical Director, Dr. Fransico Him          Comments: Naveen is making expected progress toward personal goals after completing 20 sessions. Recommend continued exercise and life style modification education including  stress management and relaxation techniques to decrease cardiac risk profile. Prestin enjoys participating in cardiac rehab although his attendance has been sporadic. Halen will complete cardiac rehab at the end of this month.Barnet Pall, RN,BSN 11/14/2016 5:16 PM

## 2016-11-15 ENCOUNTER — Encounter: Payer: Self-pay | Admitting: Cardiothoracic Surgery

## 2016-11-15 ENCOUNTER — Ambulatory Visit (INDEPENDENT_AMBULATORY_CARE_PROVIDER_SITE_OTHER): Payer: Medicare Other | Admitting: Cardiothoracic Surgery

## 2016-11-15 VITALS — BP 134/79 | HR 62 | Resp 20 | Ht 73.0 in | Wt 158.0 lb

## 2016-11-15 DIAGNOSIS — Z951 Presence of aortocoronary bypass graft: Secondary | ICD-10-CM | POA: Diagnosis not present

## 2016-11-16 ENCOUNTER — Encounter (HOSPITAL_COMMUNITY)
Admission: RE | Admit: 2016-11-16 | Discharge: 2016-11-16 | Disposition: A | Discharge status: Home / Self Care | Payer: Medicare Other | Source: Ambulatory Visit | Attending: Cardiovascular Disease | Admitting: Cardiovascular Disease

## 2016-11-16 DIAGNOSIS — Z79899 Other long term (current) drug therapy: Secondary | ICD-10-CM | POA: Diagnosis not present

## 2016-11-16 DIAGNOSIS — Z7982 Long term (current) use of aspirin: Secondary | ICD-10-CM | POA: Diagnosis not present

## 2016-11-16 DIAGNOSIS — Z87891 Personal history of nicotine dependence: Secondary | ICD-10-CM | POA: Diagnosis not present

## 2016-11-16 DIAGNOSIS — Z951 Presence of aortocoronary bypass graft: Secondary | ICD-10-CM | POA: Diagnosis not present

## 2016-11-19 ENCOUNTER — Encounter (HOSPITAL_COMMUNITY): Payer: Medicare Other

## 2016-11-21 ENCOUNTER — Encounter (HOSPITAL_COMMUNITY)
Admission: RE | Admit: 2016-11-21 | Discharge: 2016-11-21 | Disposition: A | Discharge status: Home / Self Care | Payer: Medicare Other | Source: Ambulatory Visit | Attending: Cardiovascular Disease | Admitting: Cardiovascular Disease

## 2016-11-21 VITALS — Ht 73.0 in | Wt 153.2 lb

## 2016-11-21 DIAGNOSIS — Z87891 Personal history of nicotine dependence: Secondary | ICD-10-CM | POA: Diagnosis not present

## 2016-11-21 DIAGNOSIS — Z951 Presence of aortocoronary bypass graft: Secondary | ICD-10-CM

## 2016-11-21 DIAGNOSIS — Z7982 Long term (current) use of aspirin: Secondary | ICD-10-CM | POA: Diagnosis not present

## 2016-11-21 DIAGNOSIS — Z79899 Other long term (current) drug therapy: Secondary | ICD-10-CM | POA: Diagnosis not present

## 2016-11-22 NOTE — Addendum Note (Signed)
Addendum  created 11/22/16 1121 by Roberts Gaudy, MD   Sign clinical note

## 2016-11-22 NOTE — Addendum Note (Signed)
Addendum  created 11/22/16 1114 by Roberts Gaudy, MD   Sign clinical note

## 2016-11-23 ENCOUNTER — Encounter (HOSPITAL_COMMUNITY)
Admission: RE | Admit: 2016-11-23 | Discharge: 2016-11-23 | Disposition: A | Discharge status: Home / Self Care | Payer: Medicare Other | Source: Ambulatory Visit | Attending: Cardiovascular Disease | Admitting: Cardiovascular Disease

## 2016-11-23 DIAGNOSIS — Z79899 Other long term (current) drug therapy: Secondary | ICD-10-CM | POA: Diagnosis not present

## 2016-11-23 DIAGNOSIS — Z87891 Personal history of nicotine dependence: Secondary | ICD-10-CM | POA: Diagnosis not present

## 2016-11-23 DIAGNOSIS — Z7982 Long term (current) use of aspirin: Secondary | ICD-10-CM | POA: Diagnosis not present

## 2016-11-23 DIAGNOSIS — Z951 Presence of aortocoronary bypass graft: Secondary | ICD-10-CM

## 2016-11-26 ENCOUNTER — Encounter (HOSPITAL_COMMUNITY): Payer: Medicare Other

## 2016-11-28 ENCOUNTER — Encounter (HOSPITAL_COMMUNITY)
Admission: RE | Admit: 2016-11-28 | Discharge: 2016-11-28 | Disposition: A | Discharge status: Home / Self Care | Payer: Medicare Other | Source: Ambulatory Visit | Attending: Cardiovascular Disease | Admitting: Cardiovascular Disease

## 2016-11-28 ENCOUNTER — Encounter (HOSPITAL_COMMUNITY)
Admission: RE | Admit: 2016-11-28 | Discharge: 2016-11-28 | Disposition: A | Discharge status: Home / Self Care | Payer: Medicare Other | Source: Ambulatory Visit

## 2016-11-28 DIAGNOSIS — Z951 Presence of aortocoronary bypass graft: Secondary | ICD-10-CM

## 2016-11-28 DIAGNOSIS — Z79899 Other long term (current) drug therapy: Secondary | ICD-10-CM | POA: Diagnosis not present

## 2016-11-28 DIAGNOSIS — Z87891 Personal history of nicotine dependence: Secondary | ICD-10-CM | POA: Diagnosis not present

## 2016-11-28 DIAGNOSIS — Z7982 Long term (current) use of aspirin: Secondary | ICD-10-CM | POA: Diagnosis not present

## 2016-11-28 NOTE — Progress Notes (Signed)
Discharge Progress Report  Patient Details  Name: Christian Black MRN: [id number] Date of Birth: 29-May-1945 Referring Provider:     [place] from 07/26/2016 in [place]  Referring Provider  Skeet Latch MD       Number of Visits: 25  Reason for Discharge:  Patient independent in their exercise.  Smoking History:  History  Smoking Status  . Former Smoker  . Packs/day: 0.50  . Years: 30.00  . Types: Cigarettes  . Quit date: 06/01/2016  Smokeless Tobacco  . Never Used    Diagnosis:  06/01/16 S/P CABG x 5  ADL UCSD:   Initial Exercise Prescription:     Initial Exercise Prescription - 07/26/16 1200      Date of Initial Exercise RX and Referring Provider   Date 07/26/16   Referring Provider Skeet Latch MD     Treadmill   MPH 2.2   Grade 0   Minutes 10   METs 2.68     Bike   Level 0.5   Minutes 10   METs 2.26     NuStep   Level 3   SPM 70   Minutes 10   METs 2     Prescription Details   Frequency (times per week) 3   Duration Progress to 30 minutes of continuous aerobic without signs/symptoms of physical distress     Intensity   THRR 40-80% of Max Heartrate 61-122   Ratings of Perceived Exertion 11-13   Perceived Dyspnea 0-4     Progression   Progression Continue to progress workloads to maintain intensity without signs/symptoms of physical distress.     Resistance Training   Training Prescription Yes   Weight 2lbs   Reps 10-15      Discharge Exercise Prescription (Final Exercise Prescription Changes):     Exercise Prescription Changes - 11/30/16 0955      Response to Exercise   Blood Pressure (Admit) 180/82   Blood Pressure (Exercise) 158/72   Blood Pressure (Exit) 136/80   Heart Rate (Admit) 78 bpm   Heart Rate (Exercise) 84 bpm   Heart Rate (Exit) 75 bpm   Rating of Perceived Exertion (Exercise) 11   Symptoms none   Duration Progress to 45 minutes of aerobic  exercise without signs/symptoms of physical distress   Intensity THRR unchanged     Progression   Progression Continue to progress workloads to maintain intensity without signs/symptoms of physical distress.   Average METs 3.1     Resistance Training   Training Prescription Yes   Weight 5lb   Reps 10-15   Time 10 Minutes     Interval Training   Interval Training No     Bike   Level 1   Minutes 10   METs 3.59     NuStep   Level 5   SPM 70   Minutes 10   METs 2.5     Track   Laps 13   Minutes 10   METs 3.26     Home Exercise Plan   Plans to continue exercise at Home (comment)   Frequency Add 3 additional days to program exercise sessions.   Initial Home Exercises Provided 08/17/16      Functional Capacity:     6 Minute Walk    Row Name 07/26/16 1228 11/21/16 1029       6 Minute Walk   Phase Initial Discharge    Distance 1310 feet 1354 feet  Distance % Change  - 3.36 %    Walk Time 6 minutes 6 minutes    # of Rest Breaks 0 0    MPH 2.48 2.56    METS 3.26 3.36    RPE 12 11    VO2 Peak 11.4 11.77    Symptoms No No    Resting HR 58 bpm 77 bpm    Resting BP 125/79 102/80    Max Ex. HR 61 bpm 86 bpm    Max Ex. BP 150/86 100/70    2 Minute Post BP 147/86  132 78  -       Psychological, QOL, Others - Outcomes: PHQ 2/9: Depression screen [facility] 2/9 11/28/2016 11/01/2015  Decreased Interest 0 0  Down, Depressed, Hopeless 0 0  PHQ - 2 Score 0 0    Quality of Life:     Quality of Life - 11/30/16 1100      Quality of Life Scores   Health/Function Pre 25.6 %   Health/Function Post 22.29 %   Health/Function % Change -12.93 %   Socioeconomic Pre 26.57 %   Socioeconomic Post 23.14 %   Socioeconomic % Change  -12.91 %   Psych/Spiritual Pre 29.14 %   Psych/Spiritual Post 30 %   Psych/Spiritual % Change 2.95 %   Family Pre 26.4 %   Family Post 16.8 %   Family % Change -36.36 %   GLOBAL Pre 26.65 %   GLOBAL Post 23.2 %   GLOBAL % Change -12.95 %       Personal Goals: Goals established at orientation with interventions provided to work toward goal.     Personal Goals and Risk Factors at Admission - 07/26/16 1541      Core Components/Risk Factors/Patient Goals on Admission   Tobacco Cessation Yes   Expected Outcomes Long Term: Complete abstinence from all tobacco products for at least 12 months from quit date.       Personal Goals Discharge:     Goals and Risk Factor Review    Row Name 12/27/16 1224             Core Components/Risk Factors/Patient Goals Review   Personal Goals Review Tobacco Cessation;Lipids;Hypertension       Review -  Gurtaj's vital signs were stable with exercise at cardiac rehab       Expected Outcomes -  Antolin will continue to abstain from smoking cigarettes and will continue to exercise on his own. Icarus will take his medications as presribed.          Exercise Goals and Review:     Exercise Goals    Row Name 07/26/16 0916             Exercise Goals   Increase Physical Activity Yes       Intervention Provide advice, education, support and counseling about physical activity/exercise needs.;Develop an individualized exercise prescription for aerobic and resistive training based on initial evaluation findings, risk stratification, comorbidities and participant's personal goals.       Expected Outcomes Achievement of increased cardiorespiratory fitness and enhanced flexibility, muscular endurance and strength shown through measurements of functional capacity and personal statement of participant.       Increase Strength and Stamina Yes  learn exercise and activity limitations and develop an exercise routine       Intervention Provide advice, education, support and counseling about physical activity/exercise needs.;Develop an individualized exercise prescription for aerobic and resistive training based on initial evaluation findings, risk  stratification, comorbidities and participant's personal  goals.       Expected Outcomes Achievement of increased cardiorespiratory fitness and enhanced flexibility, muscular endurance and strength shown through measurements of functional capacity and personal statement of participant.          Nutrition & Weight - Outcomes:     Pre Biometrics - 07/26/16 1229      Pre Biometrics   Waist Circumference 32.5 inches   Hip Circumference 37.5 inches   Waist to Hip Ratio 0.87 %   Triceps Skinfold 10 mm   % Body Fat 19.7 %   Grip Strength 41 kg   Flexibility 8.5 in   Single Leg Stand 6.09 seconds         Post Biometrics - 11/30/16 1100       Post  Biometrics   Height _0  (1.854 m)   Weight 163 lb 5.8 oz (74.1 kg)   Waist Circumference 30 inches   Hip Circumference 37 inches   Waist to Hip Ratio 0.81 %   BMI (Calculated) 21.56   Triceps Skinfold 10 mm   % Body Fat 18.6 %   Grip Strength 46.5 kg   Flexibility 11 in   Single Leg Stand 9.09 seconds      Nutrition:     Nutrition Therapy & Goals - 10/10/16 1116      Nutrition Therapy   Diet Therapeutic Lifestyle Changes     Personal Nutrition Goals   Nutrition Goal Pt to identify and limit food sources of saturated fat, trans fat, and sodium     Intervention Plan   Intervention Prescribe, educate and counsel regarding individualized specific dietary modifications aiming towards targeted core components such as weight, hypertension, lipid management, diabetes, heart failure and other comorbidities.   Expected Outcomes Short Term Goal: Understand basic principles of dietary content, such as calories, fat, sodium, cholesterol and nutrients.;Long Term Goal: Adherence to prescribed nutrition plan.      Nutrition Discharge:     Nutrition Assessments - 10/10/16 1116      MEDFICTS Scores   Pre Score 48      Education Questionnaire Score:     Knowledge Questionnaire Score - 11/30/16 1100      Knowledge Questionnaire Score   Pre Score 15/24   Post Score 21/28       Goals reviewed with patient; copy given to patient. Pasqual graduated from cardiac rehab program today with completion of 36 exercise sessions in Phase II. Pt maintained good attendance and progressed nicely during his participation in rehab as evidenced by increased MET level.   Medication list reconciled. Repeat  PHQ score- 0 .  Pt has made significant lifestyle changes and should be commended for his success. Pt feels he has achieved his goals during cardiac rehab.   Pt plans to continue exercise by walking on his own. Barnet Pall, RN,BSN 12/27/2016 12:36 PM

## 2016-11-30 ENCOUNTER — Encounter (HOSPITAL_COMMUNITY)
Admission: RE | Admit: 2016-11-30 | Discharge: 2016-11-30 | Disposition: A | Discharge status: Home / Self Care | Payer: Medicare Other | Source: Ambulatory Visit | Attending: Cardiovascular Disease | Admitting: Cardiovascular Disease

## 2016-11-30 VITALS — BP 180/82 | HR 78 | Ht 73.0 in | Wt 163.4 lb

## 2016-11-30 DIAGNOSIS — Z7982 Long term (current) use of aspirin: Secondary | ICD-10-CM | POA: Diagnosis not present

## 2016-11-30 DIAGNOSIS — Z79899 Other long term (current) drug therapy: Secondary | ICD-10-CM | POA: Diagnosis not present

## 2016-11-30 DIAGNOSIS — Z951 Presence of aortocoronary bypass graft: Secondary | ICD-10-CM | POA: Diagnosis not present

## 2016-11-30 DIAGNOSIS — Z87891 Personal history of nicotine dependence: Secondary | ICD-10-CM | POA: Diagnosis not present

## 2016-12-17 ENCOUNTER — Encounter: Payer: Self-pay | Admitting: Cardiovascular Disease

## 2016-12-17 ENCOUNTER — Ambulatory Visit (INDEPENDENT_AMBULATORY_CARE_PROVIDER_SITE_OTHER): Payer: Medicare HMO | Admitting: Cardiovascular Disease

## 2016-12-17 VITALS — BP 155/72 | HR 47 | Ht 76.0 in | Wt 165.0 lb

## 2016-12-17 DIAGNOSIS — I10 Essential (primary) hypertension: Secondary | ICD-10-CM

## 2016-12-17 DIAGNOSIS — I480 Paroxysmal atrial fibrillation: Secondary | ICD-10-CM | POA: Diagnosis not present

## 2016-12-17 DIAGNOSIS — E7800 Pure hypercholesterolemia, unspecified: Secondary | ICD-10-CM

## 2016-12-17 DIAGNOSIS — Z951 Presence of aortocoronary bypass graft: Secondary | ICD-10-CM | POA: Diagnosis not present

## 2016-12-17 MED ORDER — CARVEDILOL 12.5 MG PO TABS: 12.5000 mg | ORAL_TABLET | Freq: Two times a day (BID) | ORAL | 5 refills | Status: DC
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
## 2016-12-17 NOTE — Patient Instructions (Signed)
Medication Instructions:  STOP METOPROLOL   START CARVEDILOL 12.5 MG TWICE A DAY   Labwork: NONE  Testing/Procedures: NONE  Follow-Up: Your physician recommends that you schedule a follow-up appointment in: PHARM D IN 2 [place] wants you to follow-up in: [place] will receive a reminder letter in the mail two months in advance. If you don't receive a letter, please call our office to schedule the follow-up appointment.  Any Other Special Instructions Will Be Listed Below (If Applicable).  MONITOR YOUR BLOOD PRESSURE AND HEART RATE AT HOME BRING YOUR READINGS TO YOUR FOLLOW UP WITH PHARM D   If you need a refill on your cardiac medications before your next appointment, please call your pharmacy.

## 2016-12-25 NOTE — Addendum Note (Signed)
Encounter addended by: Sol Passer on: 12/25/2016 11:58 AM<BR>    Actions taken: Vitals modified, Flowsheet data copied forward, Visit Navigator Flowsheet section accepted

## 2016-12-25 NOTE — Addendum Note (Signed)
Encounter addended by: Sol Passer on: 12/25/2016 12:58 PM<BR>    Actions taken: Flowsheet data copied forward, Flowsheet accepted

## 2016-12-26 NOTE — Addendum Note (Signed)
Encounter addended by: Jewel Baize, RD on: 12/26/2016 10:46 AM<BR>    Actions taken: Flowsheet data copied forward, Visit Navigator Flowsheet section accepted

## 2016-12-27 NOTE — Addendum Note (Signed)
Encounter addended by: Magda Kiel, RN on: 12/27/2016 12:32 PM<BR>    Actions taken: Visit Navigator Flowsheet section accepted

## 2017-01-03 ENCOUNTER — Ambulatory Visit (INDEPENDENT_AMBULATORY_CARE_PROVIDER_SITE_OTHER): Payer: Medicare HMO | Admitting: Pharmacist

## 2017-01-03 VITALS — BP 124/62 | HR 60

## 2017-01-03 DIAGNOSIS — I10 Essential (primary) hypertension: Secondary | ICD-10-CM | POA: Diagnosis not present

## 2017-01-03 NOTE — Progress Notes (Signed)
Patient ID: ANCIL DEWAN                 DOB: [birth year]                      MRN: [id number]     HPI: Christian Black is a 71 y.o. male referred by Dr. Oval Linsey to HTN clinic. PMH includes CAD s/p CABG, COPD, colon cancel and hypertension. During his most recent office visit with DR Oval Linsey his metoprolol was changed to carvedilol due to bradycardia and poorly controlled blood pressure. Patient presents today for HTN follow up. Denies chest pain, swelling, headaches or increase fatigue.   Current HTN meds:  Carvedilol 12.5mg  twice daily  Previously tried:  Metoprolol - bradycardia  BP goal: 130/80  Family History: The patient's family history includes Cancer in his cousin; Crohn's disease in his grandchild; Emphysema in his maternal aunt; Heart disease in his cousin and paternal uncle; Kidney failure in his maternal aunt; Lung cancer (age of onset: 54) in his maternal uncle; Ovarian cancer (age of onset: 75) in his mother; Pancreatic cancer (age of onset: 54) in his father.   Social History: drinks beer every day (1-2 beer per day); stopped smoking few months ago  Exercise: daily walks   Home BP readings:  8 readings; average 134/91 (pulse 68-79 bpm)   Wt Readings from Last 3 Encounters:  12/17/16 165 lb (74.8 kg)  11/30/16 163 lb 5.8 oz (74.1 kg)  11/21/16 153 lb 3.5 oz (69.5 kg)   BP Readings from Last 3 Encounters:  01/03/17 124/62  12/17/16 (!) 155/72  11/30/16 (!) 180/82   Pulse Readings from Last 3 Encounters:  01/03/17 60  12/17/16 (!) 47  11/30/16 78    Past Medical History:  Diagnosis Date  . Colon cancer ([place])    colon mass  . Essential hypertension 05/23/2016  . Medical history non-contributory   . Tobacco abuse 05/23/2016    Current Outpatient Prescriptions on File Prior to Visit  Medication Sig Dispense Refill  . aspirin EC 81 MG tablet Take 81 mg by mouth daily.    Marland Kitchen atorvastatin (LIPITOR) 80 MG tablet Take 1 tablet (80 mg total) by mouth daily at 6  PM. 90 tablet 3  . carvedilol (COREG) 12.5 MG tablet Take 1 tablet (12.5 mg total) by mouth 2 (two) times daily. 60 tablet 5   No current facility-administered medications on file prior to visit.     Allergies  Allergen Reactions  . No Known Allergies     Blood pressure 124/62, pulse 60.  Essential hypertension Blood pressure is well controlled today. Patient denies problems with current therapy ,but continue to report slightly above goal BP readings from home with and average BP of 134/91. Will continue current therapy without changes. Patient will continue to monitor BP twice daily and bring reports together with home BP monitor to next f/u visit in 4 weeks.   Twylia Oka Rodriguez-Guzman PharmD, BCPS, [place] [id number] 01/03/2017 9:55 PM

## 2017-01-03 NOTE — Assessment & Plan Note (Addendum)
Blood pressure is well controlled today. Patient denies problems with current therapy ,but continue to report slightly above goal BP readings from home with and average BP of 134/91. Will continue current therapy without changes. Patient will continue to monitor BP twice daily and bring reports together with home BP monitor to next f/u visit in 4 weeks.

## 2017-01-03 NOTE — Patient Instructions (Addendum)
Return for a  follow up appointment in 4 weeks  Your blood pressure today is 126/62 pulse 60  Check your blood pressure at home daily (if able) and keep record of the readings.  Take your BP meds as follows: *Continue all medication as prescribed*  Bring all of your meds, your BP cuff and your record of home blood pressures to your next appointment.  Exercise as you're able, try to walk approximately 30 minutes per day.  Keep salt intake to a minimum, especially watch canned and prepared boxed foods.  Eat more fresh fruits and vegetables and fewer canned items.  Avoid eating in fast food restaurants.    HOW TO TAKE YOUR BLOOD PRESSURE: . Rest 5 minutes before taking your blood pressure. .  Don't smoke or drink caffeinated beverages for at least 30 minutes before. . Take your blood pressure before (not after) you eat. . Sit comfortably with your back supported and both feet on the floor (don't cross your legs). . Elevate your arm to heart level on a table or a desk. . Use the proper sized cuff. It should fit smoothly and snugly around your bare upper arm. There should be enough room to slip a fingertip under the cuff. The bottom edge of the cuff should be 1 inch above the crease of the elbow. . Ideally, take 3 measurements at one sitting and record the average.

## 2017-01-31 ENCOUNTER — Encounter: Payer: Self-pay | Admitting: Pharmacist Clinician (PhC)/ Clinical Pharmacy Specialist

## 2017-01-31 ENCOUNTER — Ambulatory Visit (INDEPENDENT_AMBULATORY_CARE_PROVIDER_SITE_OTHER): Payer: Medicare HMO | Admitting: Pharmacist Clinician (PhC)/ Clinical Pharmacy Specialist

## 2017-01-31 ENCOUNTER — Other Ambulatory Visit: Payer: Self-pay | Admitting: Pharmacist Clinician (PhC)/ Clinical Pharmacy Specialist

## 2017-01-31 VITALS — BP 152/84

## 2017-01-31 DIAGNOSIS — I10 Essential (primary) hypertension: Secondary | ICD-10-CM

## 2017-01-31 MED ORDER — IRBESARTAN 150 MG PO TABS: 150.0000 mg | ORAL_TABLET | Freq: Every day | ORAL | 5 refills | Status: DC

## 2017-01-31 NOTE — Patient Instructions (Addendum)
Return for a a follow up appointment in 1 month  Your blood pressure today is 152/84  (goal is < 130/80)  Check your blood pressure at home daily and keep record of the readings.  Take your BP meds as follows:  Start irbesartan 150 mg once daily (morning or evening)  Continue with all other medications  Bring all of your meds, your BP cuff and your record of home blood pressures to your next appointment.  Exercise as you're able, try to walk approximately 30 minutes per day.  Keep salt intake to a minimum, especially watch canned and prepared boxed foods.  Eat more fresh fruits and vegetables and fewer canned items.  Avoid eating in fast food restaurants.    HOW TO TAKE YOUR BLOOD PRESSURE: . Rest 5 minutes before taking your blood pressure. .  Don't smoke or drink caffeinated beverages for at least 30 minutes before. . Take your blood pressure before (not after) you eat. . Sit comfortably with your back supported and both feet on the floor (don't cross your legs). . Elevate your arm to heart level on a table or a desk. . Use the proper sized cuff. It should fit smoothly and snugly around your bare upper arm. There should be enough room to slip a fingertip under the cuff. The bottom edge of the cuff should be 1 inch above the crease of the elbow. . Ideally, take 3 measurements at one sitting and record the average.

## 2017-01-31 NOTE — Assessment & Plan Note (Signed)
Patient with essential hypertension, and noticeable elevated diastolic readings.  Currently only on carvedilol 12.5 mg bid.  Will start him on irbesartan 150 mg daily.  He is to continue with daily home BP checks and needs to write them on the provided log sheet.  We will see him back in a month for follow up.   He will have a BMET in 2 weeks.  His last SCr was increased at 1.5, but because of his low potassium I was hesitant to add a diuretic, and because of LEE did not want to start with amlodipine.

## 2017-01-31 NOTE — Progress Notes (Signed)
Patient ID: [name]                 DOB: [birth year]                      MRN: [id number]     HPI: Christian Black is a 71 y.o. male referred by Dr. Oval Linsey to HTN clinic. PMH includes CAD s/p CABG, COPD, colon cancel and hypertension. During his most recent office visit with DR Oval Linsey his metoprolol was changed to carvedilol due to bradycardia and poorly controlled blood pressure. At a follow up visit last month his BP was at goal, however patient reported home readings slightly higher, with an average of 134/91.  Patient returns today for HTN follow up. Denies chest pain, swelling, headaches or increase fatigue. He does admit to having a headache this am, due to the fact that he was at a Halloween party last night and had 3 margaritas.  He also notes some occasional LEE in his right leg, this has been ongoing since his CABG.    Current HTN meds:  Carvedilol 12.5mg  twice daily  Previously tried:  Metoprolol - bradycardia  BP goal: 130/80  Family History: The patient's family history includes Cancer in his cousin; Crohn's disease in his grandchild; Emphysema in his maternal aunt; Heart disease in his cousin and paternal uncle; Kidney failure in his maternal aunt; Lung cancer (age of onset: 73) in his maternal uncle; Ovarian cancer (age of onset: 3) in his mother; Pancreatic cancer (age of onset: 44) in his father.   Social History: drinks beer every day (1-2 beer per day); stopped smoking few months ago  Exercise: daily walks, about a mile at at time 3-4 times per day  Diet:  Mostly home cooked meals, no added salt, although some in meal prep, does eat canned fruits and veggies; some bananas.  Does like salad and eats regularly  Home BP readings:   Did not record readings, but brought home wrist cuff.  Readings on cuff are 517-616'W systolic, but higher diastolic readings with only one < 80 (range 80-110)  only 1 < 80.   Labs:  10/26/2016 - Na 139, K 3.4, Glu 130, BUN 14.1, Scr  1.5  Wt Readings from Last 3 Encounters:  12/17/16 165 lb (74.8 kg)  11/30/16 163 lb 5.8 oz (74.1 kg)  11/21/16 153 lb 3.5 oz (69.5 kg)   BP Readings from Last 3 Encounters:  01/31/17 (!) 152/84  01/03/17 124/62  12/17/16 (!) 155/72   Pulse Readings from Last 3 Encounters:  01/03/17 60  12/17/16 (!) 47  11/30/16 78    Past Medical History:  Diagnosis Date  . Colon cancer ([place])    colon mass  . Essential hypertension 05/23/2016  . Medical history non-contributory   . Tobacco abuse 05/23/2016    Current Outpatient Prescriptions on File Prior to Visit  Medication Sig Dispense Refill  . aspirin EC 81 MG tablet Take 81 mg by mouth daily.    Marland Kitchen atorvastatin (LIPITOR) 80 MG tablet Take 1 tablet (80 mg total) by mouth daily at 6 PM. 90 tablet 3  . carvedilol (COREG) 12.5 MG tablet Take 1 tablet (12.5 mg total) by mouth 2 (two) times daily. 60 tablet 5   No current facility-administered medications on file prior to visit.     Allergies  Allergen Reactions  . No Known Allergies     Blood pressure (!) 152/84. (patient home cuff 156/94)  Essential hypertension  Patient with essential hypertension, and noticeable elevated diastolic readings.  Currently only on carvedilol 12.5 mg bid.  Will start him on irbesartan 150 mg daily.  He is to continue with daily home BP checks and needs to write them on the provided log sheet.  We will see him back in a month for follow up.   He will have a BMET in 2 weeks.  His last SCr was increased at 1.5, but because of his low potassium I was hesitant to add a diuretic, and because of LEE did not want to start with amlodipine.     Tommy Medal PharmD CPP [facility] [address] [place] [address] 01/31/2017 12:22 PM

## 2017-02-28 ENCOUNTER — Ambulatory Visit (INDEPENDENT_AMBULATORY_CARE_PROVIDER_SITE_OTHER): Payer: Medicare HMO | Admitting: Pharmacist Clinician (PhC)/ Clinical Pharmacy Specialist

## 2017-02-28 ENCOUNTER — Encounter: Payer: Self-pay | Admitting: Pharmacist Clinician (PhC)/ Clinical Pharmacy Specialist

## 2017-02-28 DIAGNOSIS — I10 Essential (primary) hypertension: Secondary | ICD-10-CM | POA: Diagnosis not present

## 2017-02-28 LAB — BASIC METABOLIC PANEL
BUN/Creatinine Ratio: 15 (ref 10–24)
BUN: 16 mg/dL (ref 8–27)
CALCIUM: 8.9 mg/dL (ref 8.6–10.2)
CO2: 24 mmol/L (ref 20–29)
CREATININE: 1.05 mg/dL (ref 0.76–1.27)
Chloride: 104 mmol/L (ref 96–106)
GFR calc Af Amer: 84 mL/min/{1.73_m2} (ref >59)
GFR calc non Af Amer: 73 mL/min/{1.73_m2} (ref >59)
GLUCOSE: 96 mg/dL (ref 65–99)
Potassium: 4.1 mmol/L (ref 3.5–5.2)
SODIUM: 143 mmol/L (ref 134–144)

## 2017-02-28 NOTE — Patient Instructions (Signed)
Call with any concerns about your blood pressure  Your blood pressure today is 132/82  Check your blood pressure at home several times each week and keep record of the readings.  Take your BP meds as follows:  Continue with all your current medications  Bring all of your meds, your BP cuff and your record of home blood pressures to your next appointment.  Exercise as you're able, try to walk approximately 30 minutes per day.  Keep salt intake to a minimum, especially watch canned and prepared boxed foods.  Eat more fresh fruits and vegetables and fewer canned items.  Avoid eating in fast food restaurants.    HOW TO TAKE YOUR BLOOD PRESSURE: . Rest 5 minutes before taking your blood pressure. .  Don't smoke or drink caffeinated beverages for at least 30 minutes before. . Take your blood pressure before (not after) you eat. . Sit comfortably with your back supported and both feet on the floor (don't cross your legs). . Elevate your arm to heart level on a table or a desk. . Use the proper sized cuff. It should fit smoothly and snugly around your bare upper arm. There should be enough room to slip a fingertip under the cuff. The bottom edge of the cuff should be 1 inch above the crease of the elbow. . Ideally, take 3 measurements at one sitting and record the average.

## 2017-02-28 NOTE — Progress Notes (Signed)
Patient ID: Christian Black                 DOB: 08/05/1945                      MRN: [id number]     HPI: Christian Black is a 71 y.o. male referred by Dr. Oval Linsey to HTN clinic. PMH includes CAD s/p CABG, COPD, colon cancel and hypertension.  Over the past several months his metoprolol was switched to carvedilol, then irbesartan was added.   He was to have gone to lab for BMET about 2 weeks ago, after starting the irbesartan, however he has not yet done that.  Today he denies chest pain, shortness of breath, dizziness or lightheadedness.   He does get some occasional lower extremity edema, but this is unchanged.   He has been compliant with medications.  Current HTN meds:  Carvedilol 12.5mg  twice daily Irbesartan 150 mg qhs  Previously tried:  Metoprolol - bradycardia  BP goal: 130/80  Family History: The patient's family history includes Cancer in his cousin; Crohn's disease in his grandchild; Emphysema in his maternal aunt; Heart disease in his cousin and paternal uncle; Kidney failure in his maternal aunt; Lung cancer (age of onset: 23) in his maternal uncle; Ovarian cancer (age of onset: 3) in his mother; Pancreatic cancer (age of onset: 50) in his father.   Social History: drinks beer every day (1-2 beer per day); stopped smoking few months ago  Exercise: daily walks, about a mile at at time 3-4 times per day  Diet:  Mostly home cooked meals, no added salt, although some in meal prep, does eat canned fruits and veggies; some bananas.  Does like salad and eats regularly  Home BP readings:   Looking at wrist cuff readings, it is difficult to determine a pattern.  Of the last 9 readings (there is no date/time stamp on the meter) the first five averaged 12/812, while the last 4 averaged 154/100.  When asked about technique he states that sometimes he will rest his arm against his chest, but not always.  When checked in the office today (resting on his chest), the cuff read within 5 points  of the office reading.    Labs:  10/26/2016 - Na 139, K 3.4, Glu 130, BUN 14.1, Scr 1.5  Wt Readings from Last 3 Encounters:  12/17/16 165 lb (74.8 kg)  11/30/16 163 lb 5.8 oz (74.1 kg)  11/21/16 153 lb 3.5 oz (69.5 kg)   BP Readings from Last 3 Encounters:  02/28/17 132/82  01/31/17 (!) 152/84  01/03/17 124/62   Pulse Readings from Last 3 Encounters:  02/28/17 (!) 56  01/03/17 60  12/17/16 (!) 47    Past Medical History:  Diagnosis Date  . Colon cancer ([place])    colon mass  . Essential hypertension 05/23/2016  . Medical history non-contributory   . Tobacco abuse 05/23/2016    Current Outpatient Medications on File Prior to Visit  Medication Sig Dispense Refill  . aspirin EC 81 MG tablet Take 81 mg by mouth daily.    Marland Kitchen atorvastatin (LIPITOR) 80 MG tablet Take 1 tablet (80 mg total) by mouth daily at 6 PM. 90 tablet 3  . carvedilol (COREG) 12.5 MG tablet Take 1 tablet (12.5 mg total) by mouth 2 (two) times daily. 60 tablet 5  . irbesartan (AVAPRO) 150 MG tablet Take 1 tablet (150 mg total) by mouth daily. 30 tablet 5  No current facility-administered medications on file prior to visit.     Allergies  Allergen Reactions  . No Known Allergies     Blood pressure 132/82, pulse (!) 56. (patient home cuff 156/94)  Essential hypertension Patient with essential hypertension, now at goal on carvedilol and irbesartan.   Will have patient go to lab today for BMET to verify stable kidney function.  He is to continue with regular home BP monitoring and can call the office at any time in the future should he note a continued elevated BP.      Tommy Medal PharmD CPP [facility] [address] [place] [address] 02/28/2017 9:00 AM

## 2017-02-28 NOTE — Assessment & Plan Note (Signed)
Patient with essential hypertension, now at goal on carvedilol and irbesartan.   Will have patient go to lab today for BMET to verify stable kidney function.  He is to continue with regular home BP monitoring and can call the office at any time in the future should he note a continued elevated BP.

## 2017-04-23 ENCOUNTER — Ambulatory Visit (HOSPITAL_COMMUNITY)
Admission: RE | Admit: 2017-04-23 | Discharge: 2017-04-23 | Disposition: A | Discharge status: Home / Self Care | Payer: Medicare HMO | Source: Ambulatory Visit | Attending: Hematology | Admitting: Hematology

## 2017-04-23 ENCOUNTER — Inpatient Hospital Stay: Payer: Medicare HMO | Attending: Hematology

## 2017-04-23 DIAGNOSIS — I10 Essential (primary) hypertension: Secondary | ICD-10-CM | POA: Insufficient documentation

## 2017-04-23 DIAGNOSIS — Z08 Encounter for follow-up examination after completed treatment for malignant neoplasm: Secondary | ICD-10-CM | POA: Insufficient documentation

## 2017-04-23 DIAGNOSIS — Z85038 Personal history of other malignant neoplasm of large intestine: Secondary | ICD-10-CM | POA: Diagnosis not present

## 2017-04-23 DIAGNOSIS — Z951 Presence of aortocoronary bypass graft: Secondary | ICD-10-CM | POA: Insufficient documentation

## 2017-04-23 DIAGNOSIS — C182 Malignant neoplasm of ascending colon: Secondary | ICD-10-CM

## 2017-04-23 LAB — CBC WITH DIFFERENTIAL/PLATELET
Basophils Absolute: 0 10*3/uL (ref 0.0–0.1)
Basophils Relative: 0 %
Eosinophils Absolute: 0.4 10*3/uL (ref 0.0–0.5)
Eosinophils Relative: 9 %
HEMATOCRIT: 42.6 % (ref 38.4–49.9)
HEMOGLOBIN: 14.3 g/dL (ref 13.0–17.1)
LYMPHS ABS: 1.6 10*3/uL (ref 0.9–3.3)
LYMPHS PCT: 36 %
MCH: 30.3 pg (ref 27.2–33.4)
MCHC: 33.6 g/dL (ref 32.0–36.0)
MCV: 90.3 fL (ref 79.3–98.0)
MONOS PCT: 11 %
Monocytes Absolute: 0.5 10*3/uL (ref 0.1–0.9)
Neutro Abs: 1.9 10*3/uL (ref 1.5–6.5)
Neutrophils Relative %: 44 %
Platelets: 183 10*3/uL (ref 140–400)
RBC: 4.72 MIL/uL (ref 4.20–5.82)
RDW: 13.8 % (ref 11.0–15.6)
WBC: 4.4 10*3/uL (ref 4.0–10.3)

## 2017-04-23 LAB — COMPREHENSIVE METABOLIC PANEL
ALT: 20 U/L (ref 0–55)
AST: 19 U/L (ref 5–34)
Albumin: 3.5 g/dL (ref 3.5–5.0)
Alkaline Phosphatase: 79 U/L (ref 40–150)
Anion gap: 9 (ref 3–11)
BUN: 14 mg/dL (ref 7–26)
CHLORIDE: 106 mmol/L (ref 98–109)
CO2: 26 mmol/L (ref 22–29)
Calcium: 8.8 mg/dL (ref 8.4–10.4)
Creatinine, Ser: 1.3 mg/dL (ref 0.70–1.30)
GFR, EST NON AFRICAN AMERICAN: 54 mL/min — AB (ref >60)
Glucose, Bld: 97 mg/dL (ref 70–140)
POTASSIUM: 3.8 mmol/L (ref 3.5–5.1)
SODIUM: 141 mmol/L (ref 136–145)
Total Bilirubin: 0.4 mg/dL (ref 0.2–1.2)
Total Protein: 6.9 g/dL (ref 6.4–8.3)

## 2017-04-23 LAB — CEA (IN HOUSE-CHCC): CEA (CHCC-In House): <1 ng/mL (ref 0.00–5.00)
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
## 2017-04-26 ENCOUNTER — Inpatient Hospital Stay (HOSPITAL_BASED_OUTPATIENT_CLINIC_OR_DEPARTMENT_OTHER): Payer: Medicare HMO | Admitting: Hematology

## 2017-04-26 ENCOUNTER — Telehealth: Payer: Self-pay | Admitting: Hematology

## 2017-04-26 ENCOUNTER — Encounter: Payer: Self-pay | Admitting: Hematology

## 2017-04-26 VITALS — BP 132/72 | HR 63 | Temp 97.5°F | Resp 20 | Ht 76.0 in | Wt 181.9 lb

## 2017-04-26 DIAGNOSIS — I10 Essential (primary) hypertension: Secondary | ICD-10-CM | POA: Diagnosis not present

## 2017-04-26 DIAGNOSIS — Z85038 Personal history of other malignant neoplasm of large intestine: Secondary | ICD-10-CM | POA: Diagnosis not present

## 2017-04-26 DIAGNOSIS — Z951 Presence of aortocoronary bypass graft: Secondary | ICD-10-CM

## 2017-04-26 DIAGNOSIS — C182 Malignant neoplasm of ascending colon: Secondary | ICD-10-CM

## 2017-04-26 NOTE — Telephone Encounter (Signed)
Scheduled appt per 12/5 los - Gave patient AVS and calender per los.   

## 2017-04-30 ENCOUNTER — Ambulatory Visit (INDEPENDENT_AMBULATORY_CARE_PROVIDER_SITE_OTHER): Payer: Medicare HMO

## 2017-04-30 ENCOUNTER — Telehealth: Payer: Self-pay

## 2017-04-30 DIAGNOSIS — Z299 Encounter for prophylactic measures, unspecified: Secondary | ICD-10-CM

## 2017-04-30 DIAGNOSIS — Z23 Encounter for immunization: Secondary | ICD-10-CM | POA: Diagnosis not present

## 2017-05-07 ENCOUNTER — Telehealth: Payer: Self-pay | Admitting: Internal Medicine

## 2017-05-07 NOTE — Telephone Encounter (Signed)
Attempted to call patient to schedule AWV. Patient did not answer and voicemail was full. Will try to contact patient at a later time. SF

## 2017-05-14 ENCOUNTER — Other Ambulatory Visit: Payer: Self-pay | Admitting: Cardiovascular Disease

## 2017-05-14 NOTE — Telephone Encounter (Signed)
Refill Request.  

## 2017-05-15 NOTE — Telephone Encounter (Signed)
Medication Detail    Disp Refills Start End   carvedilol (COREG) 12.5 MG tablet 60 tablet 5 12/17/2016 03/17/2017   Sig - Route: Take 1 tablet (12.5 mg total) by mouth 2 (two) times daily. - Oral   Sent to pharmacy as: carvedilol (COREG) 12.5 MG tablet   Notes to Pharmacy: D/C METOPROLOL   E-Prescribing Status: Receipt confirmed by pharmacy (12/17/2016 8:48 AM EDT)   Pharmacy   [place], [place]

## 2017-05-28 ENCOUNTER — Encounter: Payer: Self-pay | Admitting: Internal Medicine

## 2017-05-28 ENCOUNTER — Telehealth: Payer: Self-pay | Admitting: Cardiovascular Disease

## 2017-05-28 NOTE — Telephone Encounter (Signed)
Returned call to daughter (ok per DPR). States patient is scheduled for jury duty on 4/2 and does not think patient has the stamina to do this.   Requesting we write a letter to excuse him from this.    Jury # [id number] Scheduled: 4/2  Advised I would sent to Dr. Oval Linsey and her nurse.

## 2017-05-28 NOTE — Telephone Encounter (Signed)
New message  Pt daughter verbalized that she is calling for the RN  Because she feels as though it will be too much   For her father to attend jury duty on 07/02/2017

## 2017-05-29 NOTE — Telephone Encounter (Signed)
OK to give a letter to excuse from jury duty.

## 2017-05-30 ENCOUNTER — Encounter: Payer: Self-pay | Admitting: *Deleted

## 2017-05-30 NOTE — Telephone Encounter (Signed)
Riesa Pope letter has been done. Mailed to her at [address] [place], [place]

## 2017-06-08 ENCOUNTER — Other Ambulatory Visit: Payer: Self-pay | Admitting: Cardiovascular Disease

## 2017-06-10 NOTE — Telephone Encounter (Signed)
Please review for refill, Thanks !  

## 2017-06-11 ENCOUNTER — Telehealth: Payer: Self-pay | Admitting: *Deleted

## 2017-06-11 ENCOUNTER — Encounter: Payer: Self-pay | Admitting: Cardiovascular Disease

## 2017-06-11 DIAGNOSIS — Z5181 Encounter for therapeutic drug level monitoring: Secondary | ICD-10-CM

## 2017-06-11 DIAGNOSIS — I10 Essential (primary) hypertension: Secondary | ICD-10-CM

## 2017-06-11 MED ORDER — LISINOPRIL 20 MG PO TABS: 20.0000 mg | ORAL_TABLET | Freq: Every day | ORAL | 2 refills | Status: DC

## 2017-06-11 NOTE — Addendum Note (Signed)
Addended by: Alvina Filbert B on: 06/11/2017 05:06 PM   Modules accepted: Orders

## 2017-06-11 NOTE — Telephone Encounter (Signed)
    Rockne Menghini, RPH-CPP  to Giorgi, [place]     06/11/17 1:21 PM  Mr. Harshberger -   Unfortunately this is a common problem right now. I am going to switch you to a medication called lisinopril 20 mg. You will just take it once daily at the same time you were taking your irbesartan. After about 2 weeks we will need to repeat your blood work to be sure your kidney function and electrolytes remain unchanged.  I will mail that order to you this afternoon.  Which pharmacy would you like me to call the prescription to?   Thank you,  Tommy Medal PharmD CPP [facility]    This MyChart message has not been read.     06/11/17 12:24 PM  Fidel Levy, RN routed this conversation to [place] . Skeet Latch, MD . Me  Fleet Contras  to Skeet Latch, MD     06/11/17 12:21 PM  The medication Irbesartan is not currently available at any of the pharmacies the pharmacist has called. Is this medication on the recall list? If so, can you substitute another medication? I only have to pills left and have been trying for several days to get a refill. My pharmacist has faxed a request to your office twice. Thanks.

## 2017-06-21 ENCOUNTER — Other Ambulatory Visit: Payer: Self-pay | Admitting: Cardiovascular Disease

## 2017-06-21 NOTE — Telephone Encounter (Signed)
Please review for refill. Thanks!  

## 2017-06-21 NOTE — Telephone Encounter (Signed)
REFILL 

## 2017-06-24 LAB — BASIC METABOLIC PANEL
BUN / CREAT RATIO: 9 — AB (ref 10–24)
BUN: 13 mg/dL (ref 8–27)
CO2: 26 mmol/L (ref 20–29)
CREATININE: 1.41 mg/dL — AB (ref 0.76–1.27)
Calcium: 8.7 mg/dL (ref 8.6–10.2)
Chloride: 106 mmol/L (ref 96–106)
GFR calc Af Amer: 58 mL/min/{1.73_m2} — ABNORMAL LOW (ref >59)
GFR calc non Af Amer: 50 mL/min/{1.73_m2} — ABNORMAL LOW (ref >59)
Glucose: 84 mg/dL (ref 65–99)
Potassium: 4.4 mmol/L (ref 3.5–5.2)
Sodium: 143 mmol/L (ref 134–144)

## 2017-06-24 MED ORDER — CARVEDILOL 12.5 MG PO TABS: 12.5000 mg | ORAL_TABLET | Freq: Two times a day (BID) | ORAL | 5 refills | Status: DC

## 2017-07-19 ENCOUNTER — Other Ambulatory Visit: Payer: Self-pay

## 2017-07-19 MED ORDER — LISINOPRIL 20 MG PO TABS: 20.0000 mg | ORAL_TABLET | Freq: Every day | ORAL | 3 refills | Status: DC

## 2017-07-25 IMAGING — CR DG CHEST 2V
2 series · 2 of 2 positions shown · non-contrast
Comparison: None.

CLINICAL DATA: Abdominal distension and n/v since yesterday; no
specific chest complaints today; f/u colon tumor per ordering BUTTER; no
known cardiopulmonary problems; smoker

EXAM:
CHEST  2 VIEW

[w chest pa]
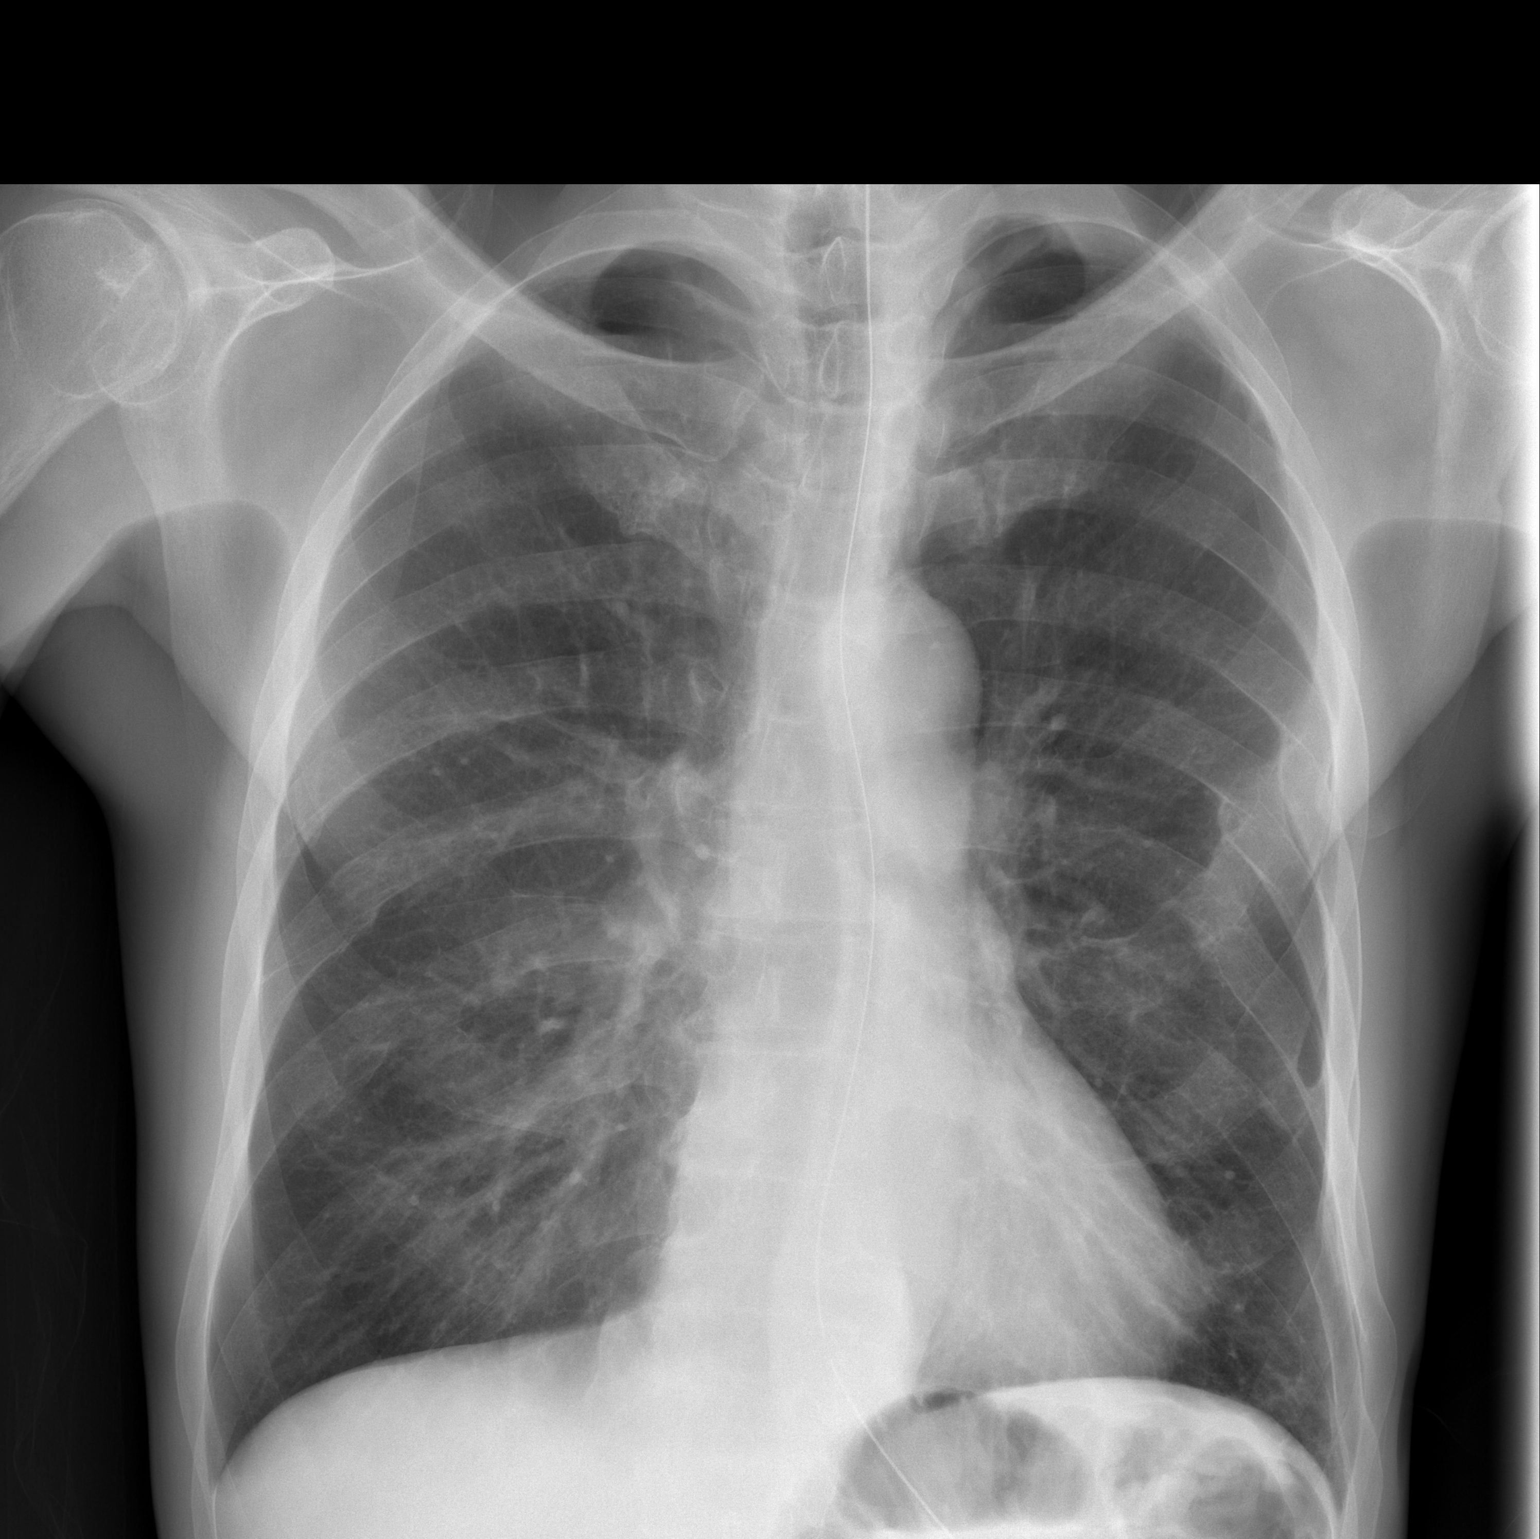

[w chest lat]
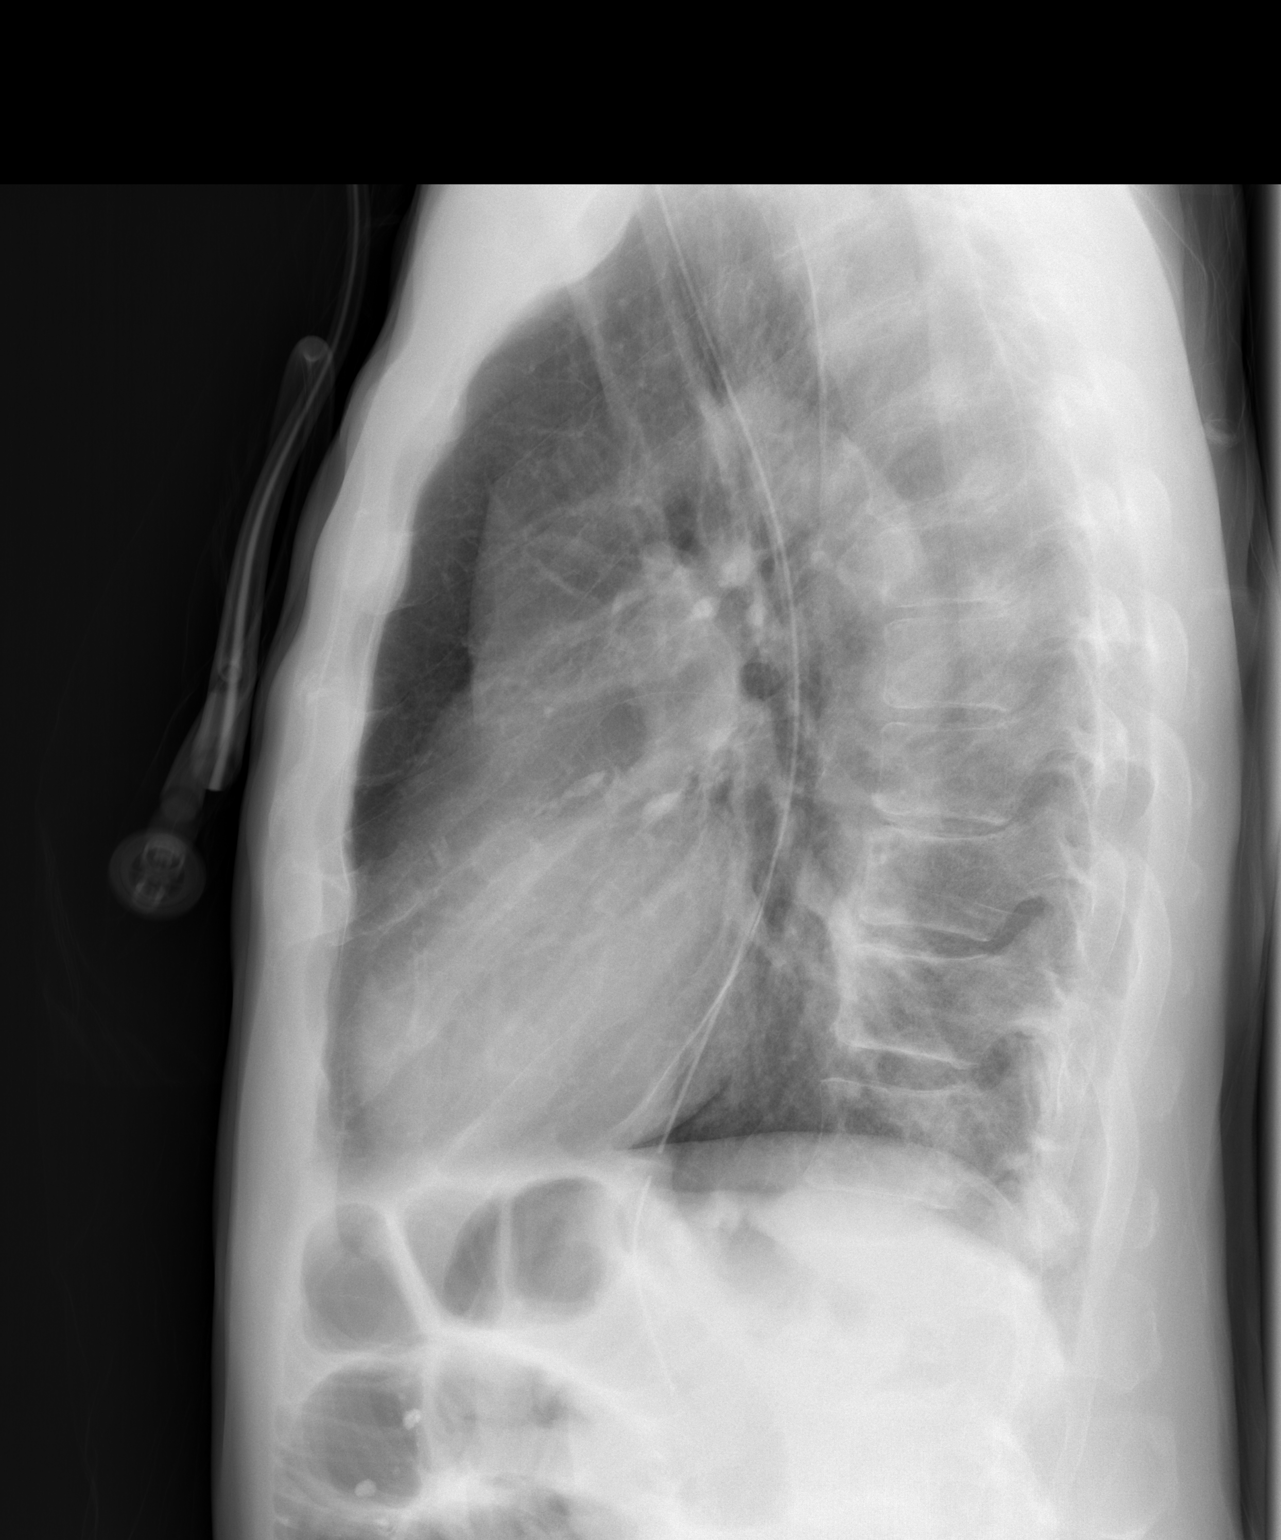

[2 of 2 positions shown; findings below may reference images not displayed]

FINDINGS: Lungs are hyperexpanded but clear. No pleural effusion or
pneumothorax.

Cardiac silhouette is normal in size. No mediastinal or hilar masses
or evidence of adenopathy.

Oral/nasogastric to has its tip projecting chest through the GE
junction. Recommend further insertion of at least 10 cm for more
optimal positioning.

There old left-sided rib fractures.
IMPRESSION: 1. No acute cardiopulmonary disease.
2. Tip of the oral/nasogastric tube chest passes through the GE
junction. Recommend further insertion.

## 2017-07-30 ENCOUNTER — Ambulatory Visit: Payer: Medicare HMO | Admitting: Cardiovascular Disease

## 2017-07-30 ENCOUNTER — Encounter: Payer: Self-pay | Admitting: Cardiovascular Disease

## 2017-07-30 VITALS — BP 160/80 | HR 52 | Ht 77.0 in | Wt 173.0 lb

## 2017-07-30 DIAGNOSIS — I480 Paroxysmal atrial fibrillation: Secondary | ICD-10-CM

## 2017-07-30 DIAGNOSIS — Z951 Presence of aortocoronary bypass graft: Secondary | ICD-10-CM | POA: Diagnosis not present

## 2017-07-30 DIAGNOSIS — I10 Essential (primary) hypertension: Secondary | ICD-10-CM | POA: Diagnosis not present

## 2017-07-30 DIAGNOSIS — Z5181 Encounter for therapeutic drug level monitoring: Secondary | ICD-10-CM | POA: Diagnosis not present

## 2017-07-30 DIAGNOSIS — E7800 Pure hypercholesterolemia, unspecified: Secondary | ICD-10-CM

## 2017-07-30 LAB — COMPREHENSIVE METABOLIC PANEL
ALT: 16 IU/L (ref 0–44)
AST: 21 IU/L (ref 0–40)
Albumin/Globulin Ratio: 1.5 (ref 1.2–2.2)
Albumin: 4.1 g/dL (ref 3.6–4.8)
Alkaline Phosphatase: 65 IU/L (ref 39–117)
BUN / CREAT RATIO: 14 (ref 10–24)
BUN: 18 mg/dL (ref 8–27)
Bilirubin Total: 0.6 mg/dL (ref 0.0–1.2)
CALCIUM: 9.3 mg/dL (ref 8.6–10.2)
CO2: 24 mmol/L (ref 20–29)
CREATININE: 1.29 mg/dL — AB (ref 0.76–1.27)
Chloride: 101 mmol/L (ref 96–106)
GFR calc Af Amer: 65 mL/min/{1.73_m2} (ref >59)
GFR, EST NON AFRICAN AMERICAN: 56 mL/min/{1.73_m2} — AB (ref >59)
GLOBULIN, TOTAL: 2.8 g/dL (ref 1.5–4.5)
Glucose: 104 mg/dL — ABNORMAL HIGH (ref 65–99)
Potassium: 4.5 mmol/L (ref 3.5–5.2)
SODIUM: 141 mmol/L (ref 134–144)
TOTAL PROTEIN: 6.9 g/dL (ref 6.0–8.5)

## 2017-07-30 LAB — LIPID PANEL
CHOLESTEROL TOTAL: 133 mg/dL (ref 100–199)
Chol/HDL Ratio: 2.1 ratio (ref 0.0–5.0)
HDL: 62 mg/dL (ref >39)
LDL Calculated: 61 mg/dL (ref 0–99)
Triglycerides: 52 mg/dL (ref 0–149)
VLDL Cholesterol Cal: 10 mg/dL (ref 5–40)

## 2017-07-30 MED ORDER — AMLODIPINE BESYLATE 5 MG PO TABS: 5.0000 mg | ORAL_TABLET | Freq: Every day | ORAL | 5 refills | Status: DC

## 2017-07-30 NOTE — Patient Instructions (Addendum)
Medication Instructions:  START AMLODIPINE 5 MG DAILY   Labwork: LP/CMET TODAY   Testing/Procedures: NONE  Follow-Up: Your physician recommends that you schedule a follow-up appointment in: [place] FOR BLOOD PRESSURE  Your physician wants you to follow-up in: [place] will receive a reminder letter in the mail two months in advance. If you don't receive a letter, please call our office to schedule the follow-up appointment.  Any Other Special Instructions Will Be Listed Below (If Applicable). MONITOR AND LOG YOUR BLOOD PRESSURE, BRING WITH YOU TO YOUR FOLLOW UP 1 MONTH   If you need a refill on your cardiac medications before your next appointment, please call your pharmacy.
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
## 2017-08-15 ENCOUNTER — Other Ambulatory Visit: Payer: Self-pay

## 2017-08-15 MED ORDER — ATORVASTATIN CALCIUM 80 MG PO TABS: 80.0000 mg | ORAL_TABLET | Freq: Every day | ORAL | 3 refills | Status: DC

## 2017-08-29 ENCOUNTER — Ambulatory Visit (INDEPENDENT_AMBULATORY_CARE_PROVIDER_SITE_OTHER): Payer: Medicare HMO | Admitting: Pharmacist Clinician (PhC)/ Clinical Pharmacy Specialist

## 2017-08-29 DIAGNOSIS — I10 Essential (primary) hypertension: Secondary | ICD-10-CM

## 2017-08-29 NOTE — Progress Notes (Signed)
08/30/2017 Alease Medina KEYTON BHAT 1945-08-30 [id number]   HPI:  Christian Black is a 72 y.o. male patient of Dr Oval Linsey, with a PMH below who presents today for hypertension clinic evaluation.  His medical history is significant for CAD s/p CABG x 5 (05/2016), prior colon cancer (s/p resection), atrial flutter, COPD and gout, in addition to hypertension.  He saw Dr. Oval Linsey in March and was found to have an elevated blood pressure.  Because of some bradycardia, his metoprolol was switched to carvedilol and amlodipine 5 mg was added.  He was asked to monitor home readings for a month and return to [facility] for follow up.  Of note we had followed him back in the fall of 2018 and his blood pressure had been controlled at that time.    No complaints today, has occasional swelling in right leg, since veins taken for CABG; no CP, SOB.  Endorses occasional dizziness, usually associated with positional changes.  He has no other complaints or concerns today   Blood Pressure Goal:  130/80  Current Medications:  Carvedilol 12.5 mg bid  Amlodipine 5 mg qd - am  Lisinopril 20 mg qd - am  Family Hx:  Heart disease in one cousin and paternal uncle; both parents died from cancers  Social Hx:  Not currently smoking; drinks 8 oz beer per day; no caffeine  Diet:  Does not eat much pork, eats plenty of vegetables  Exercise:  Walks about 1 mile 3-4 times per day  Home BP readings:  States home readings rarely above 101 systolic; has wrist cuff  Labs:  07/2017:  Na 141, K 4.5, Glu 104, BUN 18, SCr 1.29, CrCl 60.   Wt Readings from Last 3 Encounters:  08/29/17 178 lb (80.7 kg)  07/30/17 173 lb (78.5 kg)  04/26/17 181 lb 14.4 oz (82.5 kg)   BP Readings from Last 3 Encounters:  08/29/17 (!) 112/56  07/30/17 (!) 160/80  04/26/17 132/72   Pulse Readings from Last 3 Encounters:  08/29/17 (!) 56  07/30/17 (!) 52  04/26/17 63    Current Outpatient Medications  Medication Sig Dispense Refill  .  amLODipine (NORVASC) 5 MG tablet Take 1 tablet (5 mg total) by mouth daily. 30 tablet 5  . aspirin EC 81 MG tablet Take 81 mg by mouth daily.    Marland Kitchen atorvastatin (LIPITOR) 80 MG tablet Take 1 tablet (80 mg total) by mouth daily at 6 PM. 90 tablet 3  . carvedilol (COREG) 12.5 MG tablet Take 1 tablet (12.5 mg total) by mouth 2 (two) times daily. 60 tablet 5  . lisinopril (PRINIVIL,ZESTRIL) 20 MG tablet Take 1 tablet (20 mg total) by mouth daily. 90 tablet 3   No current facility-administered medications for this visit.     Allergies  Allergen Reactions  . No Known Allergies     Past Medical History:  Diagnosis Date  . Colon cancer ([place])    colon mass  . Essential hypertension 05/23/2016  . Medical history non-contributory   . Tobacco abuse 05/23/2016    Blood pressure (!) 112/56, pulse (!) 56, height 6\' 4"  (1.93 m), weight 178 lb (80.7 kg).  Essential hypertension Patient with essential hypertension, now well controlled with the addition of amlodipine 5 mg daily.  Will have him continue with current regimen.  He should continue to check home blood pressures on a weekly basis and we will see him back in 2 months for follow up.     Erasmo Downer  [facility] PharmD CPP [facility] [address] [place], [place] [address] [phone]

## 2017-08-29 NOTE — Patient Instructions (Signed)
Return for a a follow up appointment in July  Your blood pressure today is 112/56  Check your blood pressure at home 2-3 times per week and keep record of the readings.  Take your BP meds as follows:  Continue with your current medications  Bring all of your meds, your BP cuff and your record of home blood pressures to your next appointment.  Exercise as you're able, try to walk approximately 30 minutes per day.  Keep salt intake to a minimum, especially watch canned and prepared boxed foods.  Eat more fresh fruits and vegetables and fewer canned items.  Avoid eating in fast food restaurants.    HOW TO TAKE YOUR BLOOD PRESSURE: . Rest 5 minutes before taking your blood pressure. .  Don't smoke or drink caffeinated beverages for at least 30 minutes before. . Take your blood pressure before (not after) you eat. . Sit comfortably with your back supported and both feet on the floor (don't cross your legs). . Elevate your arm to heart level on a table or a desk. . Use the proper sized cuff. It should fit smoothly and snugly around your bare upper arm. There should be enough room to slip a fingertip under the cuff. The bottom edge of the cuff should be 1 inch above the crease of the elbow. . Ideally, take 3 measurements at one sitting and record the average.

## 2017-08-30 NOTE — Assessment & Plan Note (Signed)
Patient with essential hypertension, now well controlled with the addition of amlodipine 5 mg daily.  Will have him continue with current regimen.  He should continue to check home blood pressures on a weekly basis and we will see him back in 2 months for follow up.

## 2017-09-05 ENCOUNTER — Telehealth: Payer: Self-pay | Admitting: Hematology

## 2017-09-05 NOTE — Telephone Encounter (Signed)
Spoke w/ Benadette.  Rescheduled lab/YM appts from 7/25 to 7/30 @ 8/8:30.  She changed on patient's schedule.

## 2017-09-06 NOTE — Telephone Encounter (Signed)
error 

## 2017-10-24 ENCOUNTER — Other Ambulatory Visit: Payer: Medicare HMO

## 2017-10-24 ENCOUNTER — Ambulatory Visit: Payer: Medicare HMO | Admitting: Hematology

## 2017-10-28 ENCOUNTER — Encounter: Payer: Self-pay | Admitting: Pharmacist Clinician (PhC)/ Clinical Pharmacy Specialist

## 2017-10-28 ENCOUNTER — Ambulatory Visit (INDEPENDENT_AMBULATORY_CARE_PROVIDER_SITE_OTHER): Payer: Medicare HMO | Admitting: Pharmacist Clinician (PhC)/ Clinical Pharmacy Specialist

## 2017-10-28 DIAGNOSIS — I10 Essential (primary) hypertension: Secondary | ICD-10-CM

## 2017-10-28 NOTE — Patient Instructions (Signed)
Return for a a follow up appointment in November  Your blood pressure today is 96/50  Check your blood pressure at home 2-3 times per week and keep record of the readings.  Take your BP meds as follows:  Continue with all current medications  Bring all of your meds, your BP cuff and your record of home blood pressures to your next appointment.  Exercise as you're able, try to walk approximately 30 minutes per day.  Keep salt intake to a minimum, especially watch canned and prepared boxed foods.  Eat more fresh fruits and vegetables and fewer canned items.  Avoid eating in fast food restaurants.    HOW TO TAKE YOUR BLOOD PRESSURE: . Rest 5 minutes before taking your blood pressure. .  Don't smoke or drink caffeinated beverages for at least 30 minutes before. . Take your blood pressure before (not after) you eat. . Sit comfortably with your back supported and both feet on the floor (don't cross your legs). . Elevate your arm to heart level on a table or a desk. . Use the proper sized cuff. It should fit smoothly and snugly around your bare upper arm. There should be enough room to slip a fingertip under the cuff. The bottom edge of the cuff should be 1 inch above the crease of the elbow. . Ideally, take 3 measurements at one sitting and record the average.
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
## 2017-10-28 NOTE — Assessment & Plan Note (Signed)
Patient blood pressure controlled today in office.  Will not make any changes to his medications today, and will follow up with him in 3-4 months to be sure he remains stable.   He is aware to contact the office should his home BP readings be consistently < 169 systolic or his lightheadedness with positional changes become problematic.

## 2017-10-28 NOTE — Progress Notes (Signed)
10/28/2017 [name] 1945-12-11 [id number]   HPI:  Christian Black is a 72 y.o. male patient of Christian Black, with a PMH below who presents today for hypertension clinic follow up.  His medical history is significant for CAD s/p CABG x 5 (05/2016), prior colon cancer (s/p resection), atrial flutter, COPD and gout, in addition to hypertension.  He saw Christian. Oval Black in March and was found to have an elevated blood pressure.  Because of some bradycardia, his metoprolol was switched to carvedilol and amlodipine 5 mg was added.  We saw him about a month later and his pressure was well controlled at 112/56.  He was asked to continue with home BP checks and return in 2 months for follow up.   Of note, he was seen by [facility] in 2018.  We got his BP stabilized, however it was elevated again when he saw Christian. Oval Black in May of 2019.    Patient has no complaints today.  Endorses occasional dizziness, usually associated with positional changes, but he is aware to get up slowly and has had no falls or injuries.     Blood Pressure Goal:  130/80  Current Medications:  Carvedilol 12.5 mg bid  Amlodipine 5 mg qd - am  Lisinopril 20 mg qd - am  Family Hx:  Heart disease in one cousin and paternal uncle; both parents died from cancers  Social Hx:  Not currently smoking; drinks 8 oz beer per day; no caffeine  Diet:  Does not eat much pork, eats plenty of vegetables and fruits  Exercise:   Walks about 1 mile 3-4 times per day  Home BP readings:  Did not bring any readings today, but notes that they are mostly  In the 726-203 systolic range.  He does not recall any of the diastolic readings or HR.    Labs:  07/2017:  Na 141, K 4.5, Glu 104, BUN 18, SCr 1.29, CrCl 60.   Wt Readings from Last 3 Encounters:  08/29/17 178 lb (80.7 kg)  07/30/17 173 lb (78.5 kg)  04/26/17 181 lb 14.4 oz (82.5 kg)   BP Readings from Last 3 Encounters:  10/28/17 (!) 96/50  08/29/17 (!) 112/56  07/30/17 (!) 160/80     Pulse Readings from Last 3 Encounters:  10/28/17 66  08/29/17 (!) 56  07/30/17 (!) 52    Current Outpatient Medications  Medication Sig Dispense Refill  . amLODipine (NORVASC) 5 MG tablet Take 1 tablet (5 mg total) by mouth daily. 30 tablet 5  . aspirin EC 81 MG tablet Take 81 mg by mouth daily.    Marland Kitchen atorvastatin (LIPITOR) 80 MG tablet Take 1 tablet (80 mg total) by mouth daily at 6 PM. 90 tablet 3  . carvedilol (COREG) 12.5 MG tablet Take 1 tablet (12.5 mg total) by mouth 2 (two) times daily. 60 tablet 5  . lisinopril (PRINIVIL,ZESTRIL) 20 MG tablet Take 1 tablet (20 mg total) by mouth daily. 90 tablet 3   No current facility-administered medications for this visit.     Allergies  Allergen Reactions  . No Known Allergies     Past Medical History:  Diagnosis Date  . Colon cancer ([place])    colon mass  . Essential hypertension 05/23/2016  . Medical history non-contributory   . Tobacco abuse 05/23/2016    Blood pressure (!) 96/50, pulse 66.  Essential hypertension Patient blood pressure controlled today in office.  Will not make any changes to his medications  today, and will follow up with him in 3-4 months to be sure he remains stable.   He is aware to contact the office should his home BP readings be consistently < 945 systolic or his lightheadedness with positional changes become problematic.     Tommy Medal PharmD CPP [facility] [address] [place], [place] [address] [phone]

## 2017-10-29 ENCOUNTER — Encounter: Payer: Self-pay | Admitting: Nurse Practitioner

## 2017-10-29 ENCOUNTER — Inpatient Hospital Stay: Payer: Medicare HMO | Attending: Nurse Practitioner

## 2017-10-29 ENCOUNTER — Telehealth: Payer: Self-pay

## 2017-10-29 ENCOUNTER — Inpatient Hospital Stay (HOSPITAL_BASED_OUTPATIENT_CLINIC_OR_DEPARTMENT_OTHER): Payer: Medicare HMO | Admitting: Nurse Practitioner

## 2017-10-29 ENCOUNTER — Telehealth: Payer: Self-pay | Admitting: Hematology

## 2017-10-29 VITALS — BP 135/68 | HR 46 | Temp 97.9°F | Resp 16 | Ht 76.0 in | Wt 172.9 lb

## 2017-10-29 DIAGNOSIS — C184 Malignant neoplasm of transverse colon: Secondary | ICD-10-CM | POA: Insufficient documentation

## 2017-10-29 DIAGNOSIS — C182 Malignant neoplasm of ascending colon: Secondary | ICD-10-CM | POA: Diagnosis not present

## 2017-10-29 LAB — CBC WITH DIFFERENTIAL/PLATELET
BASOS ABS: 0 10*3/uL (ref 0.0–0.1)
BASOS PCT: 0 %
EOS PCT: 6 %
Eosinophils Absolute: 0.2 10*3/uL (ref 0.0–0.5)
HCT: 39.6 % (ref 38.4–49.9)
Hemoglobin: 13 g/dL (ref 13.0–17.1)
LYMPHS PCT: 38 %
Lymphs Abs: 1.6 10*3/uL (ref 0.9–3.3)
MCH: 30.2 pg (ref 27.2–33.4)
MCHC: 32.8 g/dL (ref 32.0–36.0)
MCV: 91.9 fL (ref 79.3–98.0)
Monocytes Absolute: 0.4 10*3/uL (ref 0.1–0.9)
Monocytes Relative: 10 %
Neutro Abs: 1.9 10*3/uL (ref 1.5–6.5)
Neutrophils Relative %: 46 %
PLATELETS: 164 10*3/uL (ref 140–400)
RBC: 4.31 MIL/uL (ref 4.20–5.82)
RDW: 14.6 % (ref 11.0–14.6)
WBC: 4.2 10*3/uL (ref 4.0–10.3)

## 2017-10-29 LAB — COMPREHENSIVE METABOLIC PANEL
ALT: 15 U/L (ref 0–44)
ANION GAP: 7 (ref 5–15)
AST: 20 U/L (ref 15–41)
Albumin: 3.5 g/dL (ref 3.5–5.0)
Alkaline Phosphatase: 65 U/L (ref 38–126)
BUN: 23 mg/dL (ref 8–23)
CALCIUM: 9 mg/dL (ref 8.9–10.3)
CO2: 28 mmol/L (ref 22–32)
Chloride: 108 mmol/L (ref 98–111)
Creatinine, Ser: 1.7 mg/dL — ABNORMAL HIGH (ref 0.61–1.24)
GFR calc Af Amer: 46 mL/min — ABNORMAL LOW (ref >60)
GFR, EST NON AFRICAN AMERICAN: 39 mL/min — AB (ref >60)
Glucose, Bld: 93 mg/dL (ref 70–99)
POTASSIUM: 4.2 mmol/L (ref 3.5–5.1)
SODIUM: 143 mmol/L (ref 135–145)
TOTAL PROTEIN: 6.8 g/dL (ref 6.5–8.1)
Total Bilirubin: 0.5 mg/dL (ref 0.3–1.2)

## 2017-10-29 LAB — CEA (IN HOUSE-CHCC)

## 2017-10-29 NOTE — Telephone Encounter (Signed)
Appointments scheduled AVS/Calendar printed per 7/30 los °

## 2017-10-29 NOTE — Telephone Encounter (Signed)
Spoke with patient's wife per Cira Rue NP informed CEA was normal, however creatinine is slightly elevated, instructed to increase his fluid intake and f/u with PCP, she verbalized an understanding.

## 2017-10-29 NOTE — Telephone Encounter (Signed)
-----   Message from Alla Feeling, NP sent at 10/29/2017 10:28 AM EDT ----- Please let him know CEA tumor marker is normal. His creatinine is elevated from his normal, please increase oral hydration and f/u with PCP.   Thanks, Regan Rakers NP

## 2017-11-19 NOTE — Patient Instructions (Signed)
Test(s) ordered today. Your results will be released to [place] (or called to you) after review, usually within 72hours after test completion. If any changes need to be made, you will be notified at that same time.  All other Health Maintenance issues reviewed.   All recommended immunizations and age-appropriate screenings are up-to-date or discussed.  No immunizations administered today.   Medications reviewed and updated.  Changes include  /  No changes recommended at this time.  Your prescription(s) have been submitted to your pharmacy. Please take as directed and contact our office if you believe you are having problem(s) with the medication(s).  A referral was ordered for   Please followup in     Health Maintenance, Male A healthy lifestyle and preventive care is important for your health and wellness. Ask your health care provider about what schedule of regular examinations is right for you. What should I know about weight and diet? Eat a Healthy Diet  Eat plenty of vegetables, fruits, whole grains, low-fat dairy products, and lean protein.  Do not eat a lot of foods high in solid fats, added sugars, or salt.  Maintain a Healthy Weight Regular exercise can help you achieve or maintain a healthy weight. You should:  Do at least 150 minutes of exercise each week. The exercise should increase your heart rate and make you sweat (moderate-intensity exercise).  Do strength-training exercises at least twice a week.  Watch Your Levels of Cholesterol and Blood Lipids  Have your blood tested for lipids and cholesterol every 5 years starting at 70 years of age. If you are at high risk for heart disease, you should start having your blood tested when you are 70 years old. You may need to have your cholesterol levels checked more often if: ? Your lipid or cholesterol levels are high. ? You are older than 70 years of age. ? You are at high risk for heart disease.  What should I know  about cancer screening? Many types of cancers can be detected early and may often be prevented. Lung Cancer  You should be screened every year for lung cancer if: ? You are a current smoker who has smoked for at least 30 years. ? You are a former smoker who has quit within the past 15 years.  Talk to your health care provider about your screening options, when you should start screening, and how often you should be screened.  Colorectal Cancer  Routine colorectal cancer screening usually begins at 70 years of age and should be repeated every 5-10 years until you are 70 years old. You may need to be screened more often if early forms of precancerous polyps or small growths are found. Your health care provider may recommend screening at an earlier age if you have risk factors for colon cancer.  Your health care provider may recommend using home test kits to check for hidden blood in the stool.  A small camera at the end of a tube can be used to examine your colon (sigmoidoscopy or colonoscopy). This checks for the earliest forms of colorectal cancer.  Prostate and Testicular Cancer  Depending on your age and overall health, your health care provider may do certain tests to screen for prostate and testicular cancer.  Talk to your health care provider about any symptoms or concerns you have about testicular or prostate cancer.  Skin Cancer  Check your skin from head to toe regularly.  Tell your health care provider about any new moles  or changes in moles, especially if: ? There is a change in a mole's size, shape, or color. ? You have a mole that is larger than a pencil eraser.  Always use sunscreen. Apply sunscreen liberally and repeat throughout the day.  Protect yourself by wearing long sleeves, pants, a wide-brimmed hat, and sunglasses when outside.  What should I know about heart disease, diabetes, and high blood pressure?  If you are 20-16 years of age, have your blood  pressure checked every 3-5 years. If you are 62 years of age or older, have your blood pressure checked every year. You should have your blood pressure measured twice-once when you are at a hospital or clinic, and once when you are not at a hospital or clinic. Record the average of the two measurements. To check your blood pressure when you are not at a hospital or clinic, you can use: ? An automated blood pressure machine at a pharmacy. ? A home blood pressure monitor.  Talk to your health care provider about your target blood pressure.  If you are between 44-80 years old, ask your health care provider if you should take aspirin to prevent heart disease.  Have regular diabetes screenings by checking your fasting blood sugar level. ? If you are at a normal weight and have a low risk for diabetes, have this test once every three years after the age of 52. ? If you are overweight and have a high risk for diabetes, consider being tested at a younger age or more often.  A one-time screening for abdominal aortic aneurysm (AAA) by ultrasound is recommended for men aged 60-75 years who are current or former smokers. What should I know about preventing infection? Hepatitis B If you have a higher risk for hepatitis B, you should be screened for this virus. Talk with your health care provider to find out if you are at risk for hepatitis B infection. Hepatitis C Blood testing is recommended for:  Everyone born from [birth year] through 1963.  Anyone with known risk factors for hepatitis C.  Sexually Transmitted Diseases (STDs)  You should be screened each year for STDs including gonorrhea and chlamydia if: ? You are sexually active and are younger than 70 years of age. ? You are older than 70 years of age and your health care provider tells you that you are at risk for this type of infection. ? Your sexual activity has changed since you were last screened and you are at an increased risk for chlamydia or  gonorrhea. Ask your health care provider if you are at risk.  Talk with your health care provider about whether you are at high risk of being infected with HIV. Your health care provider may recommend a prescription medicine to help prevent HIV infection.  What else can I do?  Schedule regular health, dental, and eye exams.  Stay current with your vaccines (immunizations).  Do not use any tobacco products, such as cigarettes, chewing tobacco, and e-cigarettes. If you need help quitting, ask your health care provider.  Limit alcohol intake to no more than 2 drinks per day. One drink equals 12 ounces of beer, 5 ounces of wine, or 1 ounces of hard liquor.  Do not use street drugs.  Do not share needles.  Ask your health care provider for help if you need support or information about quitting drugs.  Tell your health care provider if you often feel depressed.  Tell your health care provider if you have  ever been abused or do not feel safe at home. This information is not intended to replace advice given to you by your health care provider. Make sure you discuss any questions you have with your health care provider. Document Released: 09/15/2007 Document Revised: 11/16/2015 Document Reviewed: 12/21/2014 Elsevier Interactive Patient Education  [employer].
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
## 2017-11-20 ENCOUNTER — Encounter: Payer: Medicare HMO | Admitting: Internal Medicine

## 2017-11-20 ENCOUNTER — Ambulatory Visit: Payer: Medicare HMO
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
## 2017-11-23 ENCOUNTER — Other Ambulatory Visit: Payer: Self-pay | Admitting: Cardiovascular Disease

## 2018-02-11 ENCOUNTER — Ambulatory Visit: Payer: Medicare HMO

## 2018-02-13 ENCOUNTER — Encounter: Payer: Self-pay | Admitting: Pharmacist Clinician (PhC)/ Clinical Pharmacy Specialist

## 2018-02-13 ENCOUNTER — Ambulatory Visit (INDEPENDENT_AMBULATORY_CARE_PROVIDER_SITE_OTHER): Payer: Medicare HMO | Admitting: Pharmacist Clinician (PhC)/ Clinical Pharmacy Specialist

## 2018-02-13 VITALS — BP 140/86 | HR 60

## 2018-02-13 DIAGNOSIS — I10 Essential (primary) hypertension: Secondary | ICD-10-CM | POA: Diagnosis not present

## 2018-02-13 MED ORDER — LISINOPRIL 40 MG PO TABS: 40.0000 mg | ORAL_TABLET | Freq: Every day | ORAL | 3 refills | Status: DC

## 2018-02-13 NOTE — Progress Notes (Signed)
02/13/2018 Alease Medina JOMARION MISH May 07, 1945 [id number]   HPI:  Christian Black is a 72 y.o. male patient of Dr Oval Linsey, with a PMH below who presents today for hypertension clinic follow up.  His medical history is significant for CAD s/p CABG x 5 (05/2016), prior colon cancer (s/p resection), atrial flutter, COPD and gout, in addition to hypertension.  He saw Dr. Oval Linsey in March and was found to have an elevated blood pressure.  Because of some bradycardia, his metoprolol was switched to carvedilol and amlodipine 5 mg was added.  We saw him about a month later and his pressure was well controlled at 112/56.  He was asked to continue with home BP checks and return in 2 months for follow up.   Of note, he was seen by [facility] in 2018.  We got his BP stabilized, however it was elevated again when he saw Dr. Oval Linsey in May of 2019.    He reports feeling well today, home BP slightly elevated and other than some occasional swelling in his right leg.  No chest pain or shortness of breath, and no recent problems with dizziness or lightheadedness.    Blood Pressure Goal:  130/80  Current Medications:  Carvedilol 12.5 mg bid  Amlodipine 5 mg qd - am  Lisinopril 20 mg qd - am  Family Hx:  Heart disease in one cousin and paternal uncle; both parents died from cancers  Social Hx:  Not currently smoking; drinks two 8 oz beer per day; no caffeine  Diet:  Does not eat much pork, eats plenty of vegetables and fruits  Exercise:   Walks about 1 mile 3-4 times per day  Home BP readings:  Did not bring any readings today, but notes that they are mostly  In the 194-174'Y systolic.  Believes he has been as high as 160 on one or two occasions.  Nothing lower than 120.    He does not recall any of the diastolic readings other than they were not into the 100's.   Home cuff is [employer] wrist monitor.  When against his chest read within 5 points of the office cuff.  (was within 10 when he used it as he has  been)  Labs:  07/2017:  Na 141, K 4.5, Glu 104, BUN 18, SCr 1.29, CrCl 60.   Wt Readings from Last 3 Encounters:  10/29/17 172 lb 14.4 oz (78.4 kg)  08/29/17 178 lb (80.7 kg)  07/30/17 173 lb (78.5 kg)   BP Readings from Last 3 Encounters:  02/13/18 140/86  10/29/17 135/68  10/28/17 (!) 96/50   Pulse Readings from Last 3 Encounters:  02/13/18 60  10/29/17 (!) 46  10/28/17 66    Current Outpatient Medications  Medication Sig Dispense Refill  . amLODipine (NORVASC) 5 MG tablet TAKE 1 TABLET BY MOUTH ONCE DAILY 90 tablet 2  . aspirin EC 81 MG tablet Take 81 mg by mouth daily.    Marland Kitchen atorvastatin (LIPITOR) 80 MG tablet Take 1 tablet (80 mg total) by mouth daily at 6 PM. 90 tablet 3  . carvedilol (COREG) 12.5 MG tablet Take 1 tablet (12.5 mg total) by mouth 2 (two) times daily. 60 tablet 5  . lisinopril (PRINIVIL,ZESTRIL) 40 MG tablet Take 1 tablet (40 mg total) by mouth daily. 90 tablet 3   No current facility-administered medications for this visit.     Allergies  Allergen Reactions  . No Known Allergies     Past Medical History:  Diagnosis Date  . Colon cancer ([place])    colon mass  . Essential hypertension 05/23/2016  . Medical history non-contributory   . Tobacco abuse 05/23/2016    Blood pressure 140/86, pulse 60.  Home meter 137/82 pulse 61    Essential hypertension Patient with persistently elevated blood pressure.  He seems to do better when he is monitored on a regular basis.  Today I will increase the lisinopril to 40 mg daily and have him repeat BMET in 10 days.  We will see him again in January and he knows to call sooner should concerns arise.     Christian Black PharmD CPP [facility] [address] [place], [place] [address] [phone]

## 2018-02-13 NOTE — Assessment & Plan Note (Signed)
Patient with persistently elevated blood pressure.  He seems to do better when he is monitored on a regular basis.  Today I will increase the lisinopril to 40 mg daily and have him repeat BMET in 10 days.  We will see him again in January and he knows to call sooner should concerns arise.

## 2018-02-13 NOTE — Patient Instructions (Addendum)
Return for a a follow up appointment in 2 months  Go to the lab in 10-14 days for repeat kidney labs  Your blood pressure today is 140/86  Check your blood pressure at home daily and keep record of the readings.  Take your BP meds as follows:  Increase lisinopril from 20 mg to 40 mg daily.  Take 2 of the 20 mg tabs daily until gone  Continue with all other medications  Bring all of your meds, your BP cuff and your record of home blood pressures to your next appointment.  Exercise as you're able, try to walk approximately 30 minutes per day.  Keep salt intake to a minimum, especially watch canned and prepared boxed foods.  Eat more fresh fruits and vegetables and fewer canned items.  Avoid eating in fast food restaurants.    HOW TO TAKE YOUR BLOOD PRESSURE: . Rest 5 minutes before taking your blood pressure. .  Don't smoke or drink caffeinated beverages for at least 30 minutes before. . Take your blood pressure before (not after) you eat. . Sit comfortably with your back supported and both feet on the floor (don't cross your legs). . Elevate your arm to heart level on a table or a desk. . Use the proper sized cuff. It should fit smoothly and snugly around your bare upper arm. There should be enough room to slip a fingertip under the cuff. The bottom edge of the cuff should be 1 inch above the crease of the elbow. . Ideally, take 3 measurements at one sitting and record the average.

## 2018-03-04 LAB — BASIC METABOLIC PANEL
BUN/Creatinine Ratio: 12 (ref 10–24)
BUN: 16 mg/dL (ref 8–27)
CO2: 23 mmol/L (ref 20–29)
Calcium: 9.2 mg/dL (ref 8.6–10.2)
Chloride: 102 mmol/L (ref 96–106)
Creatinine, Ser: 1.3 mg/dL — ABNORMAL HIGH (ref 0.76–1.27)
GFR calc non Af Amer: 56 mL/min/{1.73_m2} — ABNORMAL LOW (ref >59)
GFR, EST AFRICAN AMERICAN: 64 mL/min/{1.73_m2} (ref >59)
GLUCOSE: 138 mg/dL — AB (ref 65–99)
POTASSIUM: 4.2 mmol/L (ref 3.5–5.2)
Sodium: 143 mmol/L (ref 134–144)

## 2018-04-08 NOTE — Patient Instructions (Addendum)
Return for a follow up appointment in 3 months with Dr Oval Linsey  Check your blood pressure at home daily (if able) and keep record of the readings.  Take your BP meds as follows: *NO MEDICATION CHANGES*  Bring all of your meds, your BP cuff and your record of home blood pressures to your next appointment.  Exercise as you're able, try to walk approximately 30 minutes per day.  Keep salt intake to a minimum, especially watch canned and prepared boxed foods.  Eat more fresh fruits and vegetables and fewer canned items.  Avoid eating in fast food restaurants.    HOW TO TAKE YOUR BLOOD PRESSURE: . Rest 5 minutes before taking your blood pressure. .  Don't smoke or drink caffeinated beverages for at least 30 minutes before. . Take your blood pressure before (not after) you eat. . Sit comfortably with your back supported and both feet on the floor (don't cross your legs). . Elevate your arm to heart level on a table or a desk. . Use the proper sized cuff. It should fit smoothly and snugly around your bare upper arm. There should be enough room to slip a fingertip under the cuff. The bottom edge of the cuff should be 1 inch above the crease of the elbow. . Ideally, take 3 measurements at one sitting and record the average.

## 2018-04-09 ENCOUNTER — Ambulatory Visit (INDEPENDENT_AMBULATORY_CARE_PROVIDER_SITE_OTHER): Payer: Medicare HMO | Admitting: Pharmacist

## 2018-04-09 VITALS — BP 110/60 | HR 50 | Ht 76.0 in | Wt 174.8 lb

## 2018-04-09 DIAGNOSIS — I10 Essential (primary) hypertension: Secondary | ICD-10-CM

## 2018-04-09 MED ORDER — CARVEDILOL 12.5 MG PO TABS: 12.5000 mg | ORAL_TABLET | Freq: Two times a day (BID) | ORAL | 2 refills | Status: DC

## 2018-04-09 MED ORDER — AMLODIPINE BESYLATE 5 MG PO TABS: 5.0000 mg | ORAL_TABLET | Freq: Every day | ORAL | 2 refills | Status: DC

## 2018-04-09 MED ORDER — ATORVASTATIN CALCIUM 80 MG PO TABS: 80.0000 mg | ORAL_TABLET | Freq: Every day | ORAL | 3 refills | Status: DC

## 2018-04-09 NOTE — Progress Notes (Signed)
HPI:  Christian Black is a 73 y.o. male patient of Dr Oval Linsey, with a Dresser below who presents today for hypertension clinic follow up.  His medical history is significant for CAD s/p CABG x 5 (05/2016), prior colon cancer (s/p resection), atrial flutter, COPD and gout, in addition to hypertension. Because of some bradycardia, his metoprolol was switched to carvedilol and amlodipine 5 mg was added. During last visit, lisinopril was increased to 40 mg.   He reports feeling well today and still notices some occasional swelling in his right leg. Patient denies dizziness, imbalance, headaches, vision changes, chest pain, or shortness of breath. Some dizziness with positional changes but no falls. Patient reports checking BP at home but did not bring BP log today.   Blood Pressure Goal: <130/80  Current Medications:  Carvedilol 12.5 mg twice daily  Amlodipine 5 mg daily  Lisinopril 40 mg daily  Family Hx:  Heart disease in one cousin and paternal uncle; both parents died from cancers  Social Hx:  Not currently smoking; drinks two 8 oz beer per day; no caffeine  Diet:  Does not eat much pork, eats plenty of vegetables and fruits  Exercise:   Walks about 1 mile 3-4 times per day  Home BP readings:  Did not bring any readings today, but notes that BPs are mostly in the 201'E systolic and Hrs in 07H. Home cuff is [employer] wrist monitor. When against his chest read within 5 points of the office cuff.  (was within 10 when he used it as he has been)  Labs:  03/2018:  Na 143, K 4.2, Glu 138, BUN 16, SCr 1.30, CrCl 64   Wt Readings from Last 3 Encounters:  04/09/18 174 lb 12.8 oz (79.3 kg)  10/29/17 172 lb 14.4 oz (78.4 kg)  08/29/17 178 lb (80.7 kg)   BP Readings from Last 3 Encounters:  04/09/18 110/60  02/13/18 140/86  10/29/17 135/68   Pulse Readings from Last 3 Encounters:  04/09/18 (!) 50  02/13/18 60  10/29/17 (!) 46    Current Outpatient Medications  Medication Sig Dispense  Refill  . amLODipine (NORVASC) 5 MG tablet Take 1 tablet (5 mg total) by mouth daily. 90 tablet 2  . aspirin EC 81 MG tablet Take 81 mg by mouth daily.    Marland Kitchen atorvastatin (LIPITOR) 80 MG tablet Take 1 tablet (80 mg total) by mouth daily at 6 PM. 90 tablet 3  . lisinopril (PRINIVIL,ZESTRIL) 40 MG tablet Take 1 tablet (40 mg total) by mouth daily. 90 tablet 3  . carvedilol (COREG) 12.5 MG tablet Take 1 tablet (12.5 mg total) by mouth 2 (two) times daily with a meal. 180 tablet 2   No current facility-administered medications for this visit.     Allergies  Allergen Reactions  . No Known Allergies     Past Medical History:  Diagnosis Date  . Colon cancer ([place])    colon mass  . Essential hypertension 05/23/2016  . Medical history non-contributory   . Tobacco abuse 05/23/2016   BP 110/60, pulse 50. Home meter 219'X systolic, pulse 58I.  1. Hypertension - Patient BP at goal <130/80. In clinic, BP reads 110/60.  Will not make any medications changes. Will continue carvedilol 12.5 mg BID, amlodipine 5 mg daily, lisinopril 40 mg daily. Sent in refills for amlodipine, carvedilol, and atorvastatin. Will follow up with Dr. Oval Linsey in 3 months. Encouraged the patient to bring home BP log at the next visit. Patient  instructed to call us if patient experiences dizziness or any other problems.  Note written by Almira Bar, PharmD Candidate  Orvin Netter Rodriguez-Guzman PharmD, BCPS, [place] [address] 04/09/2018 10:09 AM

## 2018-04-09 NOTE — Assessment & Plan Note (Signed)
Patient BP at goal <130/80. Will not make any medications changes. Will continue carvedilol 12.5 mg BID, amlodipine 5 mg daily, lisinopril 40 mg daily. Sent in refills for amlodipine, Will follow up with Dr. Oval Linsey in 3 months. Encouraged the patient to bring home BP log at the next visit. Patient instructed to call us if patient experiences dizziness.

## 2018-04-10 ENCOUNTER — Ambulatory Visit: Payer: Medicare HMO
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
## 2018-05-01 ENCOUNTER — Telehealth: Payer: Self-pay | Admitting: Hematology

## 2018-05-01 ENCOUNTER — Inpatient Hospital Stay: Payer: Medicare HMO | Attending: Hematology

## 2018-05-01 ENCOUNTER — Inpatient Hospital Stay (HOSPITAL_BASED_OUTPATIENT_CLINIC_OR_DEPARTMENT_OTHER): Payer: Medicare HMO | Admitting: Hematology

## 2018-05-01 ENCOUNTER — Encounter: Payer: Self-pay | Admitting: Hematology

## 2018-05-01 VITALS — BP 187/78 | HR 55 | Temp 97.7°F | Resp 20 | Ht 76.0 in | Wt 181.5 lb

## 2018-05-01 DIAGNOSIS — I10 Essential (primary) hypertension: Secondary | ICD-10-CM | POA: Diagnosis not present

## 2018-05-01 DIAGNOSIS — I2510 Atherosclerotic heart disease of native coronary artery without angina pectoris: Secondary | ICD-10-CM

## 2018-05-01 DIAGNOSIS — N179 Acute kidney failure, unspecified: Secondary | ICD-10-CM | POA: Diagnosis not present

## 2018-05-01 DIAGNOSIS — Z85038 Personal history of other malignant neoplasm of large intestine: Secondary | ICD-10-CM | POA: Insufficient documentation

## 2018-05-01 DIAGNOSIS — J439 Emphysema, unspecified: Secondary | ICD-10-CM

## 2018-05-01 DIAGNOSIS — C182 Malignant neoplasm of ascending colon: Secondary | ICD-10-CM

## 2018-05-01 LAB — CBC WITH DIFFERENTIAL/PLATELET
Abs Immature Granulocytes: 0 10*3/uL (ref 0.00–0.07)
Basophils Absolute: 0 10*3/uL (ref 0.0–0.1)
Basophils Relative: 0 %
EOS ABS: 0.2 10*3/uL (ref 0.0–0.5)
EOS PCT: 5 %
HCT: 42.5 % (ref 39.0–52.0)
Hemoglobin: 14 g/dL (ref 13.0–17.0)
Immature Granulocytes: 0 %
Lymphocytes Relative: 31 %
Lymphs Abs: 1.5 10*3/uL (ref 0.7–4.0)
MCH: 30.2 pg (ref 26.0–34.0)
MCHC: 32.9 g/dL (ref 30.0–36.0)
MCV: 91.6 fL (ref 80.0–100.0)
Monocytes Absolute: 0.5 10*3/uL (ref 0.1–1.0)
Monocytes Relative: 11 %
Neutro Abs: 2.5 10*3/uL (ref 1.7–7.7)
Neutrophils Relative %: 53 %
PLATELETS: 179 10*3/uL (ref 150–400)
RBC: 4.64 MIL/uL (ref 4.22–5.81)
RDW: 13.2 % (ref 11.5–15.5)
WBC: 4.7 10*3/uL (ref 4.0–10.5)
nRBC: 0 % (ref 0.0–0.2)

## 2018-05-01 LAB — COMPREHENSIVE METABOLIC PANEL
ALT: 14 U/L (ref 0–44)
AST: 18 U/L (ref 15–41)
Albumin: 3.9 g/dL (ref 3.5–5.0)
Alkaline Phosphatase: 80 U/L (ref 38–126)
Anion gap: 9 (ref 5–15)
BUN: 11 mg/dL (ref 8–23)
CO2: 27 mmol/L (ref 22–32)
CREATININE: 1.22 mg/dL (ref 0.61–1.24)
Calcium: 9.3 mg/dL (ref 8.9–10.3)
Chloride: 105 mmol/L (ref 98–111)
GFR calc Af Amer: >60 mL/min (ref >60)
GFR calc non Af Amer: 60 mL/min — ABNORMAL LOW (ref >60)
Glucose, Bld: 97 mg/dL (ref 70–99)
Potassium: 4.1 mmol/L (ref 3.5–5.1)
Sodium: 141 mmol/L (ref 135–145)
TOTAL PROTEIN: 7.5 g/dL (ref 6.5–8.1)
Total Bilirubin: 0.8 mg/dL (ref 0.3–1.2)

## 2018-05-01 LAB — CEA (IN HOUSE-CHCC): CEA (CHCC-In House): 1.12 ng/mL (ref 0.00–5.00)

## 2018-05-01 NOTE — Telephone Encounter (Signed)
Scheduled appt per 01/30 los. ° °Printed calendar and avs. °

## 2018-05-06 ENCOUNTER — Telehealth: Payer: Self-pay

## 2018-05-06 NOTE — Telephone Encounter (Signed)
Spoke with patient's spouse regarding lab results, per Dr. Burr Medico, CEA, CBC, CMP were all within normal limits.  She verbalized an understanding.

## 2018-05-06 NOTE — Telephone Encounter (Signed)
-----   Message from Truitt Merle, MD sent at 05/03/2018  6:20 PM EST ----- Please let pt know his lab results, CEA, CBC, CMP all WNL, no concerns, thanks   Truitt Merle  05/03/2018
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
## 2018-08-11 IMAGING — DX DG CHEST 2V
2 series · 2 of 2 positions shown · non-contrast
Comparison: 06/05/2016 and CT chest 05/25/2016.

CLINICAL DATA: Postop, chest tube removal.

EXAM:
CHEST  2 VIEW

[chest pa]
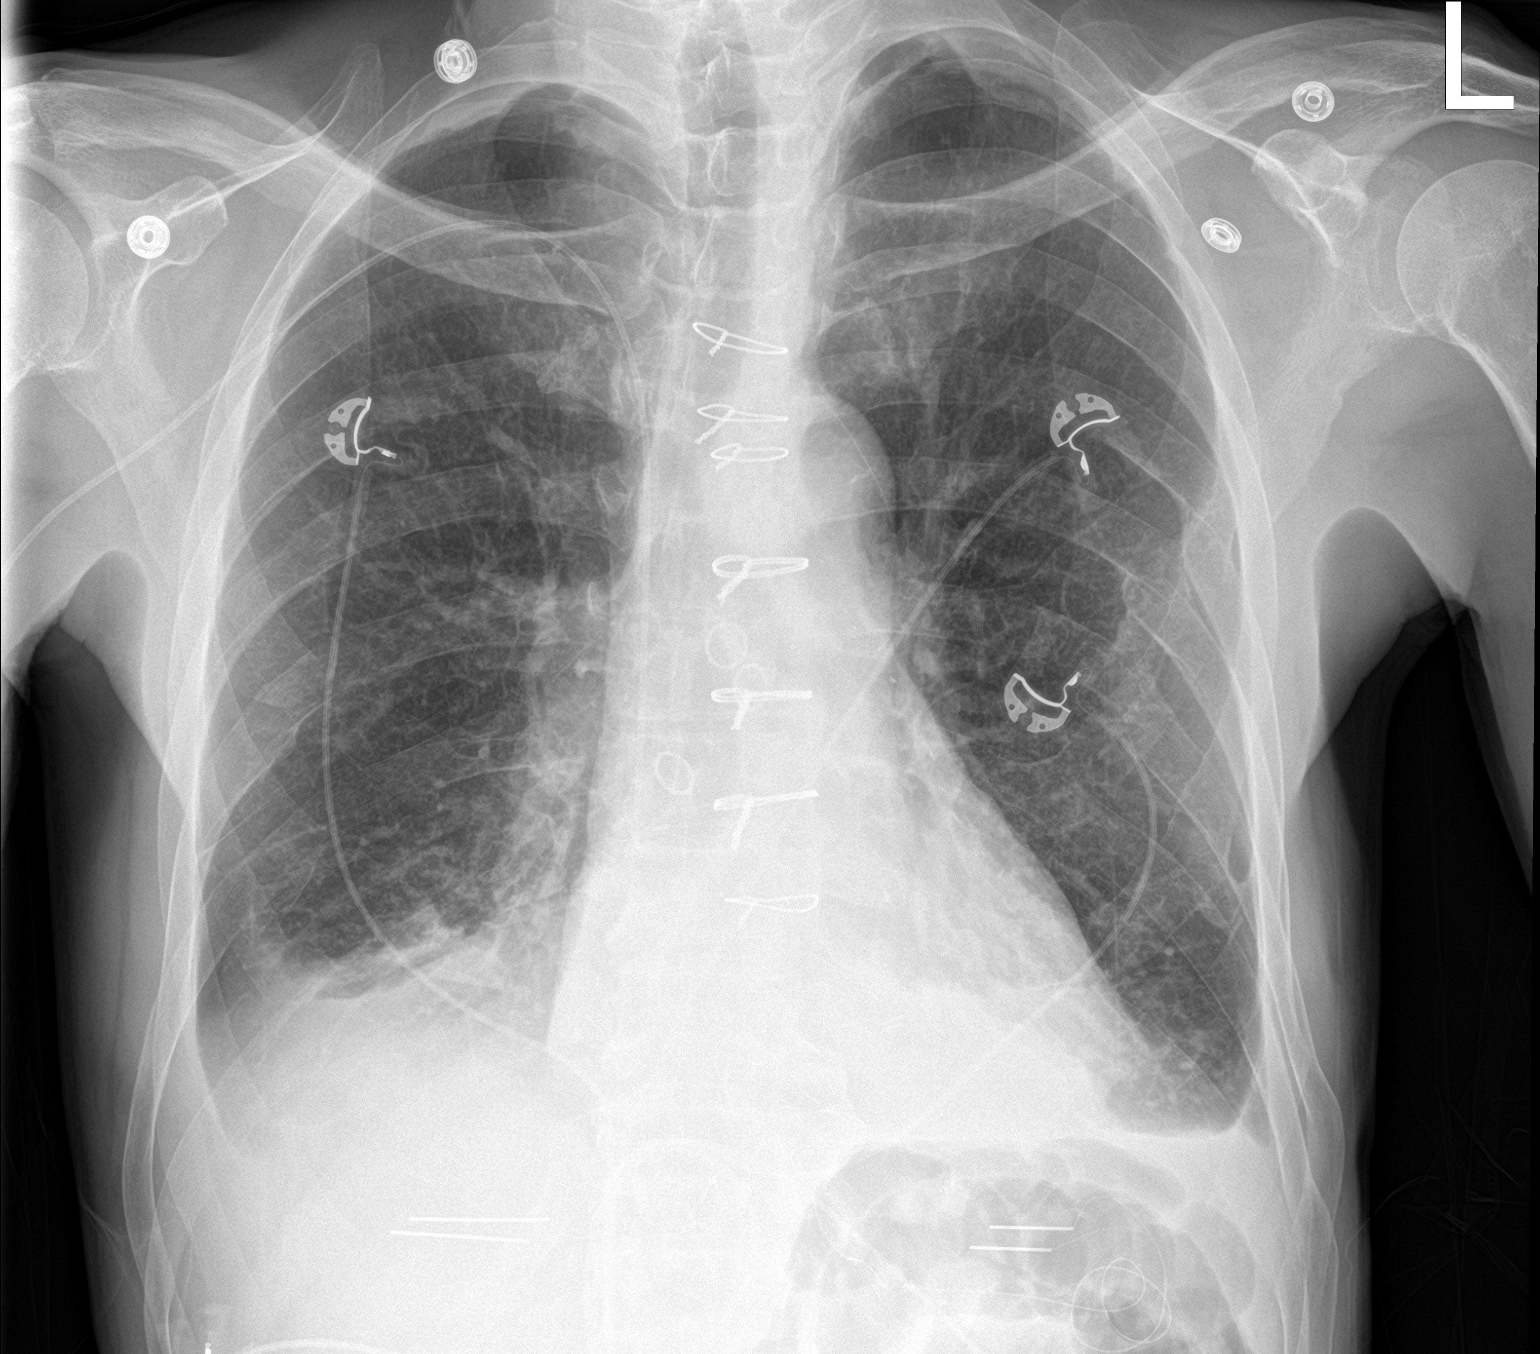

[chest lat]
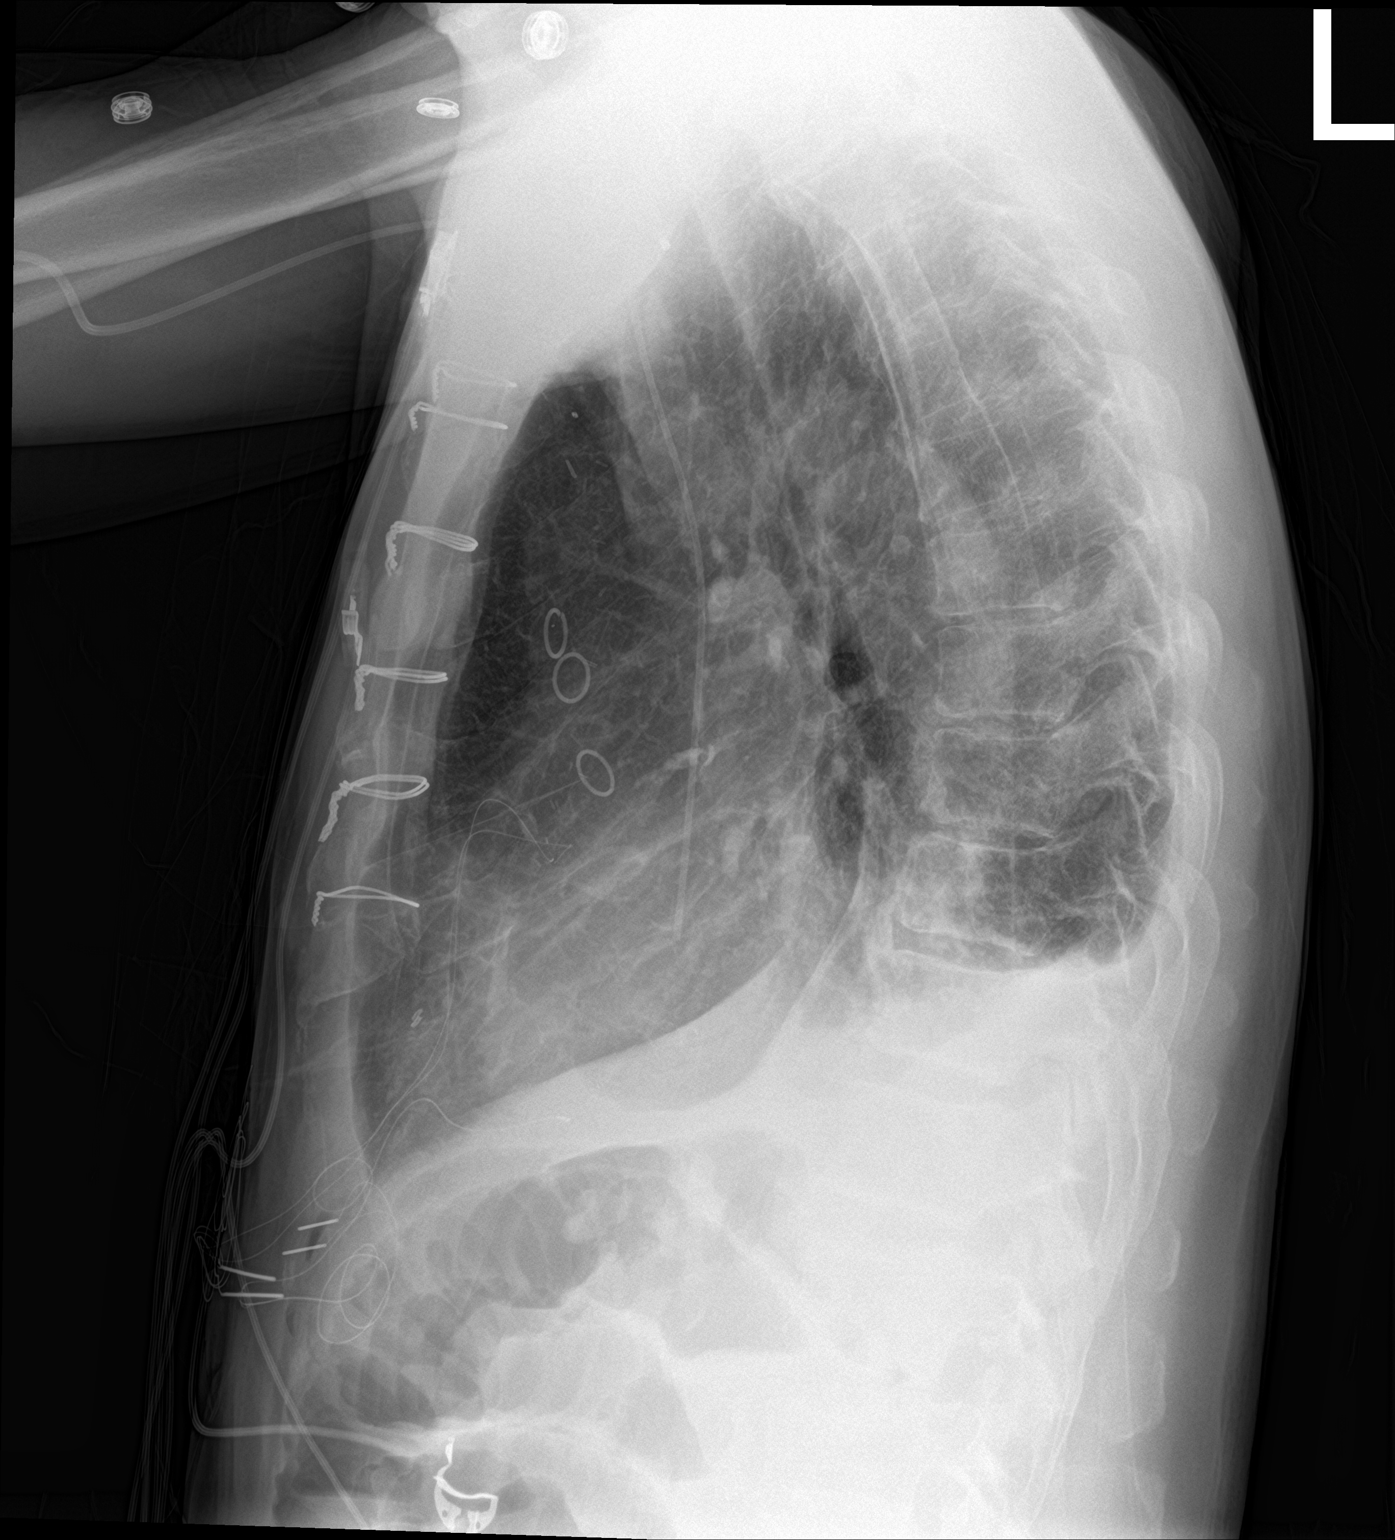

[2 of 2 positions shown; findings below may reference images not displayed]

FINDINGS: Trachea is midline. Heart size stable. 7 intact sternotomy wires.
Right PICC tip is at the SVC RA junction. Epicardial pacer wires
remain in place. Biapical pleural thickening. Small bilateral
pleural effusions with bibasilar collapse/ consolidation, improved
from 06/05/2016. No edema. Old left rib fractures.
IMPRESSION: Small bilateral pleural effusions with improving collapse/
consolidation in both lower lobes.

## 2018-08-12 ENCOUNTER — Telehealth: Payer: Self-pay | Admitting: *Deleted

## 2018-08-12 NOTE — Telephone Encounter (Signed)
A message was left, re: follow up visit. 

## 2018-10-07 ENCOUNTER — Telehealth: Payer: Self-pay | Admitting: Hematology

## 2018-10-07 NOTE — Telephone Encounter (Signed)
Scheduled appt per 7/07 sch message- pt aware of new appt date and time

## 2018-10-30 ENCOUNTER — Other Ambulatory Visit: Payer: Medicare HMO

## 2018-10-30 ENCOUNTER — Ambulatory Visit: Payer: Medicare HMO | Admitting: Hematology

## 2018-12-12 ENCOUNTER — Emergency Department (HOSPITAL_COMMUNITY)
Admission: EM | Admit: 2018-12-12 | Discharge: 2018-12-12 | Disposition: A | Discharge status: Home / Self Care | Payer: Medicare HMO | Attending: Emergency Medicine | Admitting: Emergency Medicine

## 2018-12-12 ENCOUNTER — Other Ambulatory Visit: Payer: Self-pay

## 2018-12-12 ENCOUNTER — Encounter (HOSPITAL_COMMUNITY): Payer: Self-pay | Admitting: Emergency Medicine

## 2018-12-12 DIAGNOSIS — Z7982 Long term (current) use of aspirin: Secondary | ICD-10-CM | POA: Insufficient documentation

## 2018-12-12 DIAGNOSIS — I10 Essential (primary) hypertension: Secondary | ICD-10-CM | POA: Diagnosis not present

## 2018-12-12 DIAGNOSIS — Z79899 Other long term (current) drug therapy: Secondary | ICD-10-CM | POA: Insufficient documentation

## 2018-12-12 DIAGNOSIS — R55 Syncope and collapse: Secondary | ICD-10-CM | POA: Insufficient documentation

## 2018-12-12 DIAGNOSIS — I2510 Atherosclerotic heart disease of native coronary artery without angina pectoris: Secondary | ICD-10-CM | POA: Insufficient documentation

## 2018-12-12 DIAGNOSIS — Z87891 Personal history of nicotine dependence: Secondary | ICD-10-CM | POA: Diagnosis not present

## 2018-12-12 DIAGNOSIS — J449 Chronic obstructive pulmonary disease, unspecified: Secondary | ICD-10-CM | POA: Insufficient documentation

## 2018-12-12 DIAGNOSIS — Z85038 Personal history of other malignant neoplasm of large intestine: Secondary | ICD-10-CM | POA: Diagnosis not present

## 2018-12-12 LAB — CBC
HCT: 38.6 % — ABNORMAL LOW (ref 39.0–52.0)
Hemoglobin: 12.9 g/dL — ABNORMAL LOW (ref 13.0–17.0)
MCH: 30.5 pg (ref 26.0–34.0)
MCHC: 33.4 g/dL (ref 30.0–36.0)
MCV: 91.3 fL (ref 80.0–100.0)
Platelets: 159 10*3/uL (ref 150–400)
RBC: 4.23 MIL/uL (ref 4.22–5.81)
RDW: 14.5 % (ref 11.5–15.5)
WBC: 4.9 10*3/uL (ref 4.0–10.5)
nRBC: 0 % (ref 0.0–0.2)

## 2018-12-12 LAB — BASIC METABOLIC PANEL
Anion gap: 8 (ref 5–15)
BUN: 13 mg/dL (ref 8–23)
CO2: 24 mmol/L (ref 22–32)
Calcium: 8.9 mg/dL (ref 8.9–10.3)
Chloride: 106 mmol/L (ref 98–111)
Creatinine, Ser: 1.6 mg/dL — ABNORMAL HIGH (ref 0.61–1.24)
GFR calc Af Amer: 50 mL/min — ABNORMAL LOW (ref >60)
GFR calc non Af Amer: 43 mL/min — ABNORMAL LOW (ref >60)
Glucose, Bld: 105 mg/dL — ABNORMAL HIGH (ref 70–99)
Potassium: 4.5 mmol/L (ref 3.5–5.1)
Sodium: 138 mmol/L (ref 135–145)

## 2018-12-12 LAB — CBG MONITORING, ED: Glucose-Capillary: 98 mg/dL (ref 70–99)

## 2018-12-12 MED ORDER — SODIUM CHLORIDE 0.9% FLUSH: 3.0000 mL | Freq: Once | INTRAVENOUS | Status: DC

## 2018-12-12 MED ORDER — SODIUM CHLORIDE 0.9 % IV SOLN
INTRAVENOUS | Status: DC
  Administered 2018-12-12: 20:00:00 via INTRAVENOUS

## 2018-12-12 NOTE — ED Triage Notes (Signed)
Pt brought to ED by [facility] from home after reported by family pt has a LOC. Per family pt didn't drink any water today, just had beers today prior to LOC. SR on the monitor, BP 111/63. P-78, R 16.

## 2018-12-12 NOTE — ED Notes (Signed)
Pt amblutlated around room. Denies weakness or feeling lightheaded. No issues witnessed
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
## 2019-01-19 ENCOUNTER — Encounter: Payer: Self-pay | Admitting: Hematology

## 2019-01-19 ENCOUNTER — Inpatient Hospital Stay: Payer: Medicare HMO | Attending: Hematology

## 2019-01-19 ENCOUNTER — Inpatient Hospital Stay (HOSPITAL_BASED_OUTPATIENT_CLINIC_OR_DEPARTMENT_OTHER): Payer: Medicare HMO | Admitting: Hematology

## 2019-01-19 ENCOUNTER — Other Ambulatory Visit: Payer: Self-pay

## 2019-01-19 VITALS — BP 145/72 | HR 66 | Temp 99.1°F | Resp 17 | Ht 76.0 in | Wt 185.3 lb

## 2019-01-19 DIAGNOSIS — N183 Chronic kidney disease, stage 3 unspecified: Secondary | ICD-10-CM | POA: Diagnosis not present

## 2019-01-19 DIAGNOSIS — C182 Malignant neoplasm of ascending colon: Secondary | ICD-10-CM

## 2019-01-19 DIAGNOSIS — I129 Hypertensive chronic kidney disease with stage 1 through stage 4 chronic kidney disease, or unspecified chronic kidney disease: Secondary | ICD-10-CM | POA: Diagnosis not present

## 2019-01-19 DIAGNOSIS — Z7982 Long term (current) use of aspirin: Secondary | ICD-10-CM | POA: Diagnosis not present

## 2019-01-19 DIAGNOSIS — Z85038 Personal history of other malignant neoplasm of large intestine: Secondary | ICD-10-CM | POA: Insufficient documentation

## 2019-01-19 DIAGNOSIS — Z9049 Acquired absence of other specified parts of digestive tract: Secondary | ICD-10-CM | POA: Diagnosis not present

## 2019-01-19 DIAGNOSIS — I2510 Atherosclerotic heart disease of native coronary artery without angina pectoris: Secondary | ICD-10-CM | POA: Diagnosis not present

## 2019-01-19 DIAGNOSIS — Z951 Presence of aortocoronary bypass graft: Secondary | ICD-10-CM | POA: Insufficient documentation

## 2019-01-19 DIAGNOSIS — Z79899 Other long term (current) drug therapy: Secondary | ICD-10-CM | POA: Insufficient documentation

## 2019-01-19 DIAGNOSIS — J439 Emphysema, unspecified: Secondary | ICD-10-CM | POA: Diagnosis not present

## 2019-01-19 DIAGNOSIS — F17210 Nicotine dependence, cigarettes, uncomplicated: Secondary | ICD-10-CM | POA: Insufficient documentation

## 2019-01-19 LAB — CBC WITH DIFFERENTIAL/PLATELET
Abs Immature Granulocytes: 0.01 10*3/uL (ref 0.00–0.07)
Basophils Absolute: 0 10*3/uL (ref 0.0–0.1)
Basophils Relative: 1 %
Eosinophils Absolute: 0.1 10*3/uL (ref 0.0–0.5)
Eosinophils Relative: 1 %
HCT: 40 % (ref 39.0–52.0)
Hemoglobin: 13.3 g/dL (ref 13.0–17.0)
Immature Granulocytes: 0 %
Lymphocytes Relative: 31 %
Lymphs Abs: 1.8 10*3/uL (ref 0.7–4.0)
MCH: 30 pg (ref 26.0–34.0)
MCHC: 33.3 g/dL (ref 30.0–36.0)
MCV: 90.3 fL (ref 80.0–100.0)
Monocytes Absolute: 0.5 10*3/uL (ref 0.1–1.0)
Monocytes Relative: 9 %
Neutro Abs: 3.4 10*3/uL (ref 1.7–7.7)
Neutrophils Relative %: 58 %
Platelets: 188 10*3/uL (ref 150–400)
RBC: 4.43 MIL/uL (ref 4.22–5.81)
RDW: 14 % (ref 11.5–15.5)
WBC: 5.7 10*3/uL (ref 4.0–10.5)
nRBC: 0 % (ref 0.0–0.2)

## 2019-01-19 LAB — COMPREHENSIVE METABOLIC PANEL
ALT: 19 U/L (ref 0–44)
AST: 20 U/L (ref 15–41)
Albumin: 3.6 g/dL (ref 3.5–5.0)
Alkaline Phosphatase: 59 U/L (ref 38–126)
Anion gap: 11 (ref 5–15)
BUN: 16 mg/dL (ref 8–23)
CO2: 25 mmol/L (ref 22–32)
Calcium: 8.8 mg/dL — ABNORMAL LOW (ref 8.9–10.3)
Chloride: 105 mmol/L (ref 98–111)
Creatinine, Ser: 1.51 mg/dL — ABNORMAL HIGH (ref 0.61–1.24)
GFR calc Af Amer: 53 mL/min — ABNORMAL LOW (ref >60)
GFR calc non Af Amer: 46 mL/min — ABNORMAL LOW (ref >60)
Glucose, Bld: 126 mg/dL — ABNORMAL HIGH (ref 70–99)
Potassium: 4 mmol/L (ref 3.5–5.1)
Sodium: 141 mmol/L (ref 135–145)
Total Bilirubin: 0.6 mg/dL (ref 0.3–1.2)
Total Protein: 7.1 g/dL (ref 6.5–8.1)

## 2019-01-19 LAB — CEA (IN HOUSE-CHCC): CEA (CHCC-In House): <1 ng/mL (ref 0.00–5.00)

## 2019-01-20 ENCOUNTER — Telehealth: Payer: Self-pay | Admitting: Hematology

## 2019-01-20 NOTE — Telephone Encounter (Signed)
Scheduled appt per 10/19 los.  Spoke with pt ex wife and she is aware of the appt date and time.

## 2019-02-17 ENCOUNTER — Other Ambulatory Visit: Payer: Self-pay | Admitting: Cardiovascular Disease

## 2019-03-17 ENCOUNTER — Encounter: Payer: Self-pay | Admitting: Cardiovascular Disease

## 2019-03-17 ENCOUNTER — Ambulatory Visit (INDEPENDENT_AMBULATORY_CARE_PROVIDER_SITE_OTHER): Payer: Medicare HMO | Admitting: Cardiovascular Disease

## 2019-03-17 ENCOUNTER — Other Ambulatory Visit: Payer: Self-pay

## 2019-03-17 VITALS — BP 128/60 | HR 52 | Temp 97.1°F | Ht 76.0 in | Wt 183.0 lb

## 2019-03-17 DIAGNOSIS — E7800 Pure hypercholesterolemia, unspecified: Secondary | ICD-10-CM | POA: Diagnosis not present

## 2019-03-17 DIAGNOSIS — I10 Essential (primary) hypertension: Secondary | ICD-10-CM

## 2019-03-17 DIAGNOSIS — I480 Paroxysmal atrial fibrillation: Secondary | ICD-10-CM

## 2019-03-17 DIAGNOSIS — Z23 Encounter for immunization: Secondary | ICD-10-CM | POA: Diagnosis not present

## 2019-03-17 DIAGNOSIS — Z5181 Encounter for therapeutic drug level monitoring: Secondary | ICD-10-CM
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
## 2019-03-17 NOTE — Patient Instructions (Signed)
Medication Instructions:  Your physician recommends that you continue on your current medications as directed. Please refer to the Current Medication list given to you today.  *If you need a refill on your cardiac medications before your next appointment, please call your pharmacy*  Lab Work: FASTING LP/CMET SOON   If you have labs (blood work) drawn today and your tests are completely normal, you will receive your results only by: Marland Kitchen MyChart Message (if you have MyChart) OR . A paper copy in the mail If you have any lab test that is abnormal or we need to change your treatment, we will call you to review the results.  Testing/Procedures: NONE   Follow-Up: At [facility], you and your health needs are our priority.  As part of our continuing mission to provide you with exceptional heart care, we have created designated Provider Care Teams.  These Care Teams include your primary Cardiologist (physician) and Advanced Practice Providers (APPs -  Physician Assistants and Nurse Practitioners) who all work together to provide you with the care you need, when you need it.  Your next appointment:   12 month(s)  The format for your next appointment:   In Person  Provider:   You may see DR [facility]  or one of the following Advanced Practice Providers on your designated Care Team:    Kerin Ransom, PA-C  [place], [place]  Coletta Memos, [place]  [place]

## 2019-04-24 ENCOUNTER — Ambulatory Visit: Payer: Medicare HMO | Attending: Internal Medicine

## 2019-04-24 DIAGNOSIS — Z23 Encounter for immunization: Secondary | ICD-10-CM | POA: Insufficient documentation

## 2019-04-24 NOTE — Progress Notes (Signed)
   Covid-19 Vaccination Clinic  Name:  Christian Black    MRN: [id number] DOB: [birth year]  04/24/2019  [name] was observed post Covid-19 immunization for 15 minutes without incidence. He was provided with Vaccine Information Sheet and instruction to access the V-Safe system.   [name] was instructed to call 911 with any severe reactions post vaccine: Marland Kitchen Difficulty breathing  . Swelling of your face and throat  . A fast heartbeat  . A bad rash all over your body  . Dizziness and weakness    Immunizations Administered    Name Date Dose VIS Date Route   Pfizer COVID-19 Vaccine 04/24/2019  3:59 PM 0.3 mL 03/13/2019 Intramuscular   Manufacturer: [place]   Lot: [id number]   [place]: [id number]

## 2019-05-15 ENCOUNTER — Ambulatory Visit: Payer: Medicare HMO | Attending: Internal Medicine

## 2019-05-15 DIAGNOSIS — Z23 Encounter for immunization: Secondary | ICD-10-CM | POA: Insufficient documentation

## 2019-05-15 NOTE — Progress Notes (Signed)
   Covid-19 Vaccination Clinic  Name:  Christian Black    MRN: [id number] DOB: 1945-10-20  05/15/2019  [name] was observed post Covid-19 immunization for 15 minutes without incidence. He was provided with Vaccine Information Sheet and instruction to access the V-Safe system.   [name] was instructed to call 911 with any severe reactions post vaccine: Marland Kitchen Difficulty breathing  . Swelling of your face and throat  . A fast heartbeat  . A bad rash all over your body  . Dizziness and weakness    Immunizations Administered    Name Date Dose VIS Date Route   Pfizer COVID-19 Vaccine 05/15/2019 12:20 PM 0.3 mL 03/13/2019 Intramuscular   Manufacturer: [place]   Lot: [id number]   [place]: [id number]

## 2019-05-20 ENCOUNTER — Other Ambulatory Visit: Payer: Self-pay | Admitting: Cardiovascular Disease

## 2019-06-03 ENCOUNTER — Other Ambulatory Visit: Payer: Self-pay | Admitting: Cardiovascular Disease

## 2019-06-25 ENCOUNTER — Other Ambulatory Visit: Payer: Self-pay | Admitting: Cardiovascular Disease

## 2019-06-26 ENCOUNTER — Other Ambulatory Visit: Payer: Self-pay

## 2019-08-27 ENCOUNTER — Other Ambulatory Visit: Payer: Self-pay | Admitting: Cardiovascular Disease

## 2019-08-28 ENCOUNTER — Other Ambulatory Visit: Payer: Self-pay | Admitting: Cardiovascular Disease

## 2019-08-28 NOTE — Telephone Encounter (Signed)
*  STAT* If patient is at the pharmacy, call can be transferred to refill team.   1. Which medications need to be refilled? (please list name of each medication and dose if known)  carvedilol (COREG) 12.5 MG tablet  2. Which pharmacy/location (including street and city if local pharmacy) is medication to be sent to? [place], [place]  3. Do they need a 30 day or 90 day supply? 90 day supply

## 2019-08-30 ENCOUNTER — Other Ambulatory Visit: Payer: Self-pay | Admitting: Cardiovascular Disease

## 2019-09-01 ENCOUNTER — Telehealth: Payer: Self-pay | Admitting: Cardiovascular Disease

## 2019-09-01 MED ORDER — CARVEDILOL 12.5 MG PO TABS: 12.5000 mg | ORAL_TABLET | Freq: Two times a day (BID) | ORAL | 2 refills | Status: DC

## 2019-09-01 NOTE — Telephone Encounter (Signed)
Daughter of the patient was calling to check the status of the patient's refill request of Carvedilol. The patient is out of medication. The daughter has been informed that the pharmacy was sent a new rx today
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
## 2020-01-18 ENCOUNTER — Inpatient Hospital Stay: Payer: Medicare HMO | Attending: Hematology

## 2020-01-18 ENCOUNTER — Encounter: Payer: Self-pay | Admitting: Hematology

## 2020-01-18 ENCOUNTER — Inpatient Hospital Stay (HOSPITAL_BASED_OUTPATIENT_CLINIC_OR_DEPARTMENT_OTHER): Payer: Medicare HMO | Admitting: Hematology

## 2020-01-18 ENCOUNTER — Other Ambulatory Visit: Payer: Self-pay

## 2020-01-18 VITALS — BP 117/71 | HR 50 | Temp 97.0°F | Resp 18 | Wt 183.7 lb

## 2020-01-18 DIAGNOSIS — Z951 Presence of aortocoronary bypass graft: Secondary | ICD-10-CM | POA: Insufficient documentation

## 2020-01-18 DIAGNOSIS — Z7982 Long term (current) use of aspirin: Secondary | ICD-10-CM | POA: Diagnosis not present

## 2020-01-18 DIAGNOSIS — C182 Malignant neoplasm of ascending colon: Secondary | ICD-10-CM

## 2020-01-18 DIAGNOSIS — I129 Hypertensive chronic kidney disease with stage 1 through stage 4 chronic kidney disease, or unspecified chronic kidney disease: Secondary | ICD-10-CM | POA: Diagnosis not present

## 2020-01-18 DIAGNOSIS — I2510 Atherosclerotic heart disease of native coronary artery without angina pectoris: Secondary | ICD-10-CM | POA: Diagnosis not present

## 2020-01-18 DIAGNOSIS — N183 Chronic kidney disease, stage 3 unspecified: Secondary | ICD-10-CM | POA: Diagnosis not present

## 2020-01-18 DIAGNOSIS — Z79899 Other long term (current) drug therapy: Secondary | ICD-10-CM | POA: Diagnosis not present

## 2020-01-18 LAB — COMPREHENSIVE METABOLIC PANEL
ALT: 23 U/L (ref 0–44)
AST: 22 U/L (ref 15–41)
Albumin: 3.3 g/dL — ABNORMAL LOW (ref 3.5–5.0)
Alkaline Phosphatase: 71 U/L (ref 38–126)
Anion gap: 4 — ABNORMAL LOW (ref 5–15)
BUN: 17 mg/dL (ref 8–23)
CO2: 30 mmol/L (ref 22–32)
Calcium: 9.1 mg/dL (ref 8.9–10.3)
Chloride: 108 mmol/L (ref 98–111)
Creatinine, Ser: 1.38 mg/dL — ABNORMAL HIGH (ref 0.61–1.24)
GFR, Estimated: 51 mL/min — ABNORMAL LOW (ref >60)
Glucose, Bld: 117 mg/dL — ABNORMAL HIGH (ref 70–99)
Potassium: 3.8 mmol/L (ref 3.5–5.1)
Sodium: 142 mmol/L (ref 135–145)
Total Bilirubin: 0.6 mg/dL (ref 0.3–1.2)
Total Protein: 6.8 g/dL (ref 6.5–8.1)

## 2020-01-18 LAB — CBC WITH DIFFERENTIAL/PLATELET
Abs Immature Granulocytes: 0.01 10*3/uL (ref 0.00–0.07)
Basophils Absolute: 0 10*3/uL (ref 0.0–0.1)
Basophils Relative: 1 %
Eosinophils Absolute: 0.2 10*3/uL (ref 0.0–0.5)
Eosinophils Relative: 4 %
HCT: 41.1 % (ref 39.0–52.0)
Hemoglobin: 13.5 g/dL (ref 13.0–17.0)
Immature Granulocytes: 0 %
Lymphocytes Relative: 40 %
Lymphs Abs: 1.5 10*3/uL (ref 0.7–4.0)
MCH: 30.3 pg (ref 26.0–34.0)
MCHC: 32.8 g/dL (ref 30.0–36.0)
MCV: 92.2 fL (ref 80.0–100.0)
Monocytes Absolute: 0.3 10*3/uL (ref 0.1–1.0)
Monocytes Relative: 8 %
Neutro Abs: 1.8 10*3/uL (ref 1.7–7.7)
Neutrophils Relative %: 47 %
Platelets: 166 10*3/uL (ref 150–400)
RBC: 4.46 MIL/uL (ref 4.22–5.81)
RDW: 14.7 % (ref 11.5–15.5)
WBC: 3.9 10*3/uL — ABNORMAL LOW (ref 4.0–10.5)
nRBC: 0 % (ref 0.0–0.2)

## 2020-01-18 LAB — CEA (IN HOUSE-CHCC): CEA (CHCC-In House): <1 ng/mL (ref 0.00–5.00)

## 2020-01-19 ENCOUNTER — Telehealth: Payer: Self-pay | Admitting: Hematology

## 2020-01-19 NOTE — Telephone Encounter (Signed)
F/u as needed per 10/18 los

## 2020-03-22 ENCOUNTER — Ambulatory Visit: Payer: Medicare HMO | Admitting: Cardiovascular Disease

## 2020-03-22 ENCOUNTER — Encounter: Payer: Self-pay | Admitting: Cardiovascular Disease

## 2020-03-22 ENCOUNTER — Other Ambulatory Visit: Payer: Self-pay

## 2020-03-22 VITALS — BP 130/60 | HR 54 | Ht 76.0 in | Wt 187.0 lb

## 2020-03-22 DIAGNOSIS — Z951 Presence of aortocoronary bypass graft: Secondary | ICD-10-CM

## 2020-03-22 DIAGNOSIS — I480 Paroxysmal atrial fibrillation: Secondary | ICD-10-CM | POA: Diagnosis not present

## 2020-03-22 DIAGNOSIS — E7800 Pure hypercholesterolemia, unspecified: Secondary | ICD-10-CM

## 2020-03-22 DIAGNOSIS — I10 Essential (primary) hypertension: Secondary | ICD-10-CM

## 2020-03-22 DIAGNOSIS — I2510 Atherosclerotic heart disease of native coronary artery without angina pectoris: Secondary | ICD-10-CM

## 2020-03-22 DIAGNOSIS — Z5181 Encounter for therapeutic drug level monitoring: Secondary | ICD-10-CM

## 2020-03-22 HISTORY — DX: Pure hypercholesterolemia, unspecified: E78.00

## 2020-03-22 MED ORDER — LISINOPRIL 40 MG PO TABS: 40.0000 mg | ORAL_TABLET | Freq: Every day | ORAL | 3 refills | Status: DC

## 2020-03-22 NOTE — Addendum Note (Signed)
Addended by: Alvina Filbert B on: 03/22/2020 05:07 PM   Modules accepted: Orders
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
## 2020-03-22 NOTE — Patient Instructions (Signed)
Medication Instructions:  Your physician recommends that you continue on your current medications as directed. Please refer to the Current Medication list given to you today.   *If you need a refill on your cardiac medications before your next appointment, please call your pharmacy*  Lab Work: FASTING LP/CMET SOON   Testing/Procedures: NONE   Follow-Up: At [facility], you and your health needs are our priority.  As part of our continuing mission to provide you with exceptional heart care, we have created designated Provider Care Teams.  These Care Teams include your primary Cardiologist (physician) and Advanced Practice Providers (APPs -  Physician Assistants and Nurse Practitioners) who all work together to provide you with the care you need, when you need it.  We recommend signing up for the patient portal called "MyChart".  Sign up information is provided on this After Visit Summary.  MyChart is used to connect with patients for Virtual Visits (Telemedicine).  Patients are able to view lab/test results, encounter notes, upcoming appointments, etc.  Non-urgent messages can be sent to your provider as well.   To learn more about what you can do with MyChart, go to [email or web address].    Your next appointment:   12 month(s)  The format for your next appointment:   In Person  Provider:   You may see DR [facility]  or one of the following Advanced Practice Providers on your designated Care Team:    Kerin Ransom, PA-C  [place], [place]  Coletta Memos, [place]

## 2020-03-28 LAB — COMPREHENSIVE METABOLIC PANEL
ALT: 28 IU/L (ref 0–44)
AST: 33 IU/L (ref 0–40)
Albumin/Globulin Ratio: 1.6 (ref 1.2–2.2)
Albumin: 4.1 g/dL (ref 3.7–4.7)
Alkaline Phosphatase: 84 IU/L (ref 44–121)
BUN/Creatinine Ratio: 11 (ref 10–24)
BUN: 13 mg/dL (ref 8–27)
Bilirubin Total: 0.7 mg/dL (ref 0.0–1.2)
CO2: 25 mmol/L (ref 20–29)
Calcium: 9.4 mg/dL (ref 8.6–10.2)
Chloride: 109 mmol/L — ABNORMAL HIGH (ref 96–106)
Creatinine, Ser: 1.19 mg/dL (ref 0.76–1.27)
GFR calc Af Amer: 70 mL/min/{1.73_m2} (ref >59)
GFR calc non Af Amer: 61 mL/min/{1.73_m2} (ref >59)
Globulin, Total: 2.6 g/dL (ref 1.5–4.5)
Glucose: 102 mg/dL — ABNORMAL HIGH (ref 65–99)
Potassium: 4.5 mmol/L (ref 3.5–5.2)
Sodium: 145 mmol/L — ABNORMAL HIGH (ref 134–144)
Total Protein: 6.7 g/dL (ref 6.0–8.5)

## 2020-03-28 LAB — LIPID PANEL
Chol/HDL Ratio: 1.7 ratio (ref 0.0–5.0)
Cholesterol, Total: 152 mg/dL (ref 100–199)
HDL: 89 mg/dL (ref >39)
LDL Chol Calc (NIH): 52 mg/dL (ref 0–99)
Triglycerides: 50 mg/dL (ref 0–149)
VLDL Cholesterol Cal: 11 mg/dL (ref 5–40)

## 2020-04-07 ENCOUNTER — Other Ambulatory Visit: Payer: Self-pay | Admitting: Cardiovascular Disease

## 2020-06-17 ENCOUNTER — Other Ambulatory Visit: Payer: Self-pay | Admitting: Cardiovascular Disease

## 2020-07-07 ENCOUNTER — Other Ambulatory Visit: Payer: Self-pay

## 2020-07-07 ENCOUNTER — Inpatient Hospital Stay (HOSPITAL_COMMUNITY): Payer: Medicare Other

## 2020-07-07 ENCOUNTER — Other Ambulatory Visit (HOSPITAL_COMMUNITY): Payer: Medicare HMO

## 2020-07-07 ENCOUNTER — Emergency Department (HOSPITAL_COMMUNITY): Payer: Medicare Other

## 2020-07-07 ENCOUNTER — Encounter (HOSPITAL_COMMUNITY): Payer: Self-pay | Admitting: Pharmacy Technician

## 2020-07-07 ENCOUNTER — Inpatient Hospital Stay (HOSPITAL_COMMUNITY)
Admission: EM | Admit: 2020-07-07 | Discharge: 2020-07-09 | DRG: 312 | Disposition: A | Discharge status: Home / Self Care | Payer: Medicare Other | Attending: Internal Medicine | Admitting: Internal Medicine

## 2020-07-07 DIAGNOSIS — Z85038 Personal history of other malignant neoplasm of large intestine: Secondary | ICD-10-CM | POA: Diagnosis not present

## 2020-07-07 DIAGNOSIS — N1832 Chronic kidney disease, stage 3b: Secondary | ICD-10-CM | POA: Diagnosis present

## 2020-07-07 DIAGNOSIS — F17210 Nicotine dependence, cigarettes, uncomplicated: Secondary | ICD-10-CM | POA: Diagnosis not present

## 2020-07-07 DIAGNOSIS — I10 Essential (primary) hypertension: Secondary | ICD-10-CM | POA: Diagnosis present

## 2020-07-07 DIAGNOSIS — Z8041 Family history of malignant neoplasm of ovary: Secondary | ICD-10-CM | POA: Diagnosis not present

## 2020-07-07 DIAGNOSIS — I361 Nonrheumatic tricuspid (valve) insufficiency: Secondary | ICD-10-CM | POA: Diagnosis not present

## 2020-07-07 DIAGNOSIS — F1910 Other psychoactive substance abuse, uncomplicated: Secondary | ICD-10-CM | POA: Diagnosis present

## 2020-07-07 DIAGNOSIS — F1410 Cocaine abuse, uncomplicated: Secondary | ICD-10-CM | POA: Diagnosis present

## 2020-07-07 DIAGNOSIS — F1010 Alcohol abuse, uncomplicated: Secondary | ICD-10-CM | POA: Diagnosis present

## 2020-07-07 DIAGNOSIS — Z8249 Family history of ischemic heart disease and other diseases of the circulatory system: Secondary | ICD-10-CM | POA: Diagnosis not present

## 2020-07-07 DIAGNOSIS — J449 Chronic obstructive pulmonary disease, unspecified: Secondary | ICD-10-CM | POA: Diagnosis present

## 2020-07-07 DIAGNOSIS — Z801 Family history of malignant neoplasm of trachea, bronchus and lung: Secondary | ICD-10-CM | POA: Diagnosis not present

## 2020-07-07 DIAGNOSIS — Z825 Family history of asthma and other chronic lower respiratory diseases: Secondary | ICD-10-CM

## 2020-07-07 DIAGNOSIS — E860 Dehydration: Secondary | ICD-10-CM | POA: Diagnosis not present

## 2020-07-07 DIAGNOSIS — Z7982 Long term (current) use of aspirin: Secondary | ICD-10-CM

## 2020-07-07 DIAGNOSIS — R9089 Other abnormal findings on diagnostic imaging of central nervous system: Secondary | ICD-10-CM | POA: Diagnosis not present

## 2020-07-07 DIAGNOSIS — N179 Acute kidney failure, unspecified: Secondary | ICD-10-CM | POA: Diagnosis not present

## 2020-07-07 DIAGNOSIS — E785 Hyperlipidemia, unspecified: Secondary | ICD-10-CM | POA: Diagnosis present

## 2020-07-07 DIAGNOSIS — R7303 Prediabetes: Secondary | ICD-10-CM | POA: Diagnosis present

## 2020-07-07 DIAGNOSIS — Z20822 Contact with and (suspected) exposure to covid-19: Secondary | ICD-10-CM | POA: Diagnosis not present

## 2020-07-07 DIAGNOSIS — I129 Hypertensive chronic kidney disease with stage 1 through stage 4 chronic kidney disease, or unspecified chronic kidney disease: Secondary | ICD-10-CM | POA: Diagnosis not present

## 2020-07-07 DIAGNOSIS — F1210 Cannabis abuse, uncomplicated: Secondary | ICD-10-CM | POA: Diagnosis not present

## 2020-07-07 DIAGNOSIS — Z800 Family history of malignant neoplasm of digestive organs: Secondary | ICD-10-CM | POA: Diagnosis not present

## 2020-07-07 DIAGNOSIS — Z79899 Other long term (current) drug therapy: Secondary | ICD-10-CM | POA: Diagnosis not present

## 2020-07-07 DIAGNOSIS — N182 Chronic kidney disease, stage 2 (mild): Secondary | ICD-10-CM

## 2020-07-07 DIAGNOSIS — R001 Bradycardia, unspecified: Secondary | ICD-10-CM | POA: Diagnosis present

## 2020-07-07 DIAGNOSIS — I2510 Atherosclerotic heart disease of native coronary artery without angina pectoris: Secondary | ICD-10-CM | POA: Diagnosis present

## 2020-07-07 DIAGNOSIS — N189 Chronic kidney disease, unspecified: Secondary | ICD-10-CM | POA: Diagnosis present

## 2020-07-07 DIAGNOSIS — I340 Nonrheumatic mitral (valve) insufficiency: Secondary | ICD-10-CM | POA: Diagnosis not present

## 2020-07-07 DIAGNOSIS — R55 Syncope and collapse: Principal | ICD-10-CM | POA: Diagnosis present

## 2020-07-07 DIAGNOSIS — R739 Hyperglycemia, unspecified: Secondary | ICD-10-CM | POA: Diagnosis present

## 2020-07-07 DIAGNOSIS — Z951 Presence of aortocoronary bypass graft: Secondary | ICD-10-CM

## 2020-07-07 LAB — HEPATIC FUNCTION PANEL
ALT: 29 U/L (ref 0–44)
AST: 32 U/L (ref 15–41)
Albumin: 3.6 g/dL (ref 3.5–5.0)
Alkaline Phosphatase: 63 U/L (ref 38–126)
Bilirubin, Direct: 0.2 mg/dL (ref 0.0–0.2)
Indirect Bilirubin: 0.9 mg/dL (ref 0.3–0.9)
Total Bilirubin: 1.1 mg/dL (ref 0.3–1.2)
Total Protein: 7 g/dL (ref 6.5–8.1)

## 2020-07-07 LAB — BASIC METABOLIC PANEL
Anion gap: 12 (ref 5–15)
BUN: 37 mg/dL — ABNORMAL HIGH (ref 8–23)
CO2: 26 mmol/L (ref 22–32)
Calcium: 9.2 mg/dL (ref 8.9–10.3)
Chloride: 98 mmol/L (ref 98–111)
Creatinine, Ser: 3.05 mg/dL — ABNORMAL HIGH (ref 0.61–1.24)
GFR, Estimated: 21 mL/min — ABNORMAL LOW (ref >60)
Glucose, Bld: 154 mg/dL — ABNORMAL HIGH (ref 70–99)
Potassium: 4.3 mmol/L (ref 3.5–5.1)
Sodium: 136 mmol/L (ref 135–145)

## 2020-07-07 LAB — CBC WITH DIFFERENTIAL/PLATELET
Abs Immature Granulocytes: 0.02 10*3/uL (ref 0.00–0.07)
Basophils Absolute: 0 10*3/uL (ref 0.0–0.1)
Basophils Relative: 0 %
Eosinophils Absolute: 0 10*3/uL (ref 0.0–0.5)
Eosinophils Relative: 0 %
HCT: 45.9 % (ref 39.0–52.0)
Hemoglobin: 15.1 g/dL (ref 13.0–17.0)
Immature Granulocytes: 0 %
Lymphocytes Relative: 14 %
Lymphs Abs: 0.9 10*3/uL (ref 0.7–4.0)
MCH: 30.6 pg (ref 26.0–34.0)
MCHC: 32.9 g/dL (ref 30.0–36.0)
MCV: 92.9 fL (ref 80.0–100.0)
Monocytes Absolute: 0.4 10*3/uL (ref 0.1–1.0)
Monocytes Relative: 7 %
Neutro Abs: 5.2 10*3/uL (ref 1.7–7.7)
Neutrophils Relative %: 79 %
Platelets: 203 10*3/uL (ref 150–400)
RBC: 4.94 MIL/uL (ref 4.22–5.81)
RDW: 13.4 % (ref 11.5–15.5)
WBC: 6.7 10*3/uL (ref 4.0–10.5)
nRBC: 0 % (ref 0.0–0.2)

## 2020-07-07 LAB — URINALYSIS, ROUTINE W REFLEX MICROSCOPIC
Bilirubin Urine: NEGATIVE
Glucose, UA: 150 mg/dL — AB
Hgb urine dipstick: NEGATIVE
Ketones, ur: NEGATIVE mg/dL
Leukocytes,Ua: NEGATIVE
Nitrite: NEGATIVE
Protein, ur: NEGATIVE mg/dL
Specific Gravity, Urine: 1.005 (ref 1.005–1.030)
pH: 6 (ref 5.0–8.0)

## 2020-07-07 LAB — D-DIMER, QUANTITATIVE: D-Dimer, Quant: 0.53 ug/mL-FEU — ABNORMAL HIGH (ref 0.00–0.50)

## 2020-07-07 LAB — CBG MONITORING, ED: Glucose-Capillary: 154 mg/dL — ABNORMAL HIGH (ref 70–99)

## 2020-07-07 LAB — HEMOGLOBIN A1C
Hgb A1c MFr Bld: 6.1 % — ABNORMAL HIGH (ref 4.8–5.6)
Mean Plasma Glucose: 128.37 mg/dL

## 2020-07-07 LAB — TROPONIN I (HIGH SENSITIVITY)
Troponin I (High Sensitivity): 7 ng/L (ref <18)
Troponin I (High Sensitivity): 8 ng/L (ref <18)

## 2020-07-07 LAB — CK: Total CK: 92 U/L (ref 49–397)

## 2020-07-07 LAB — RESP PANEL BY RT-PCR (FLU A&B, COVID) ARPGX2
Influenza A by PCR: NEGATIVE
Influenza B by PCR: NEGATIVE
SARS Coronavirus 2 by RT PCR: NEGATIVE

## 2020-07-07 LAB — POC OCCULT BLOOD, ED: Fecal Occult Bld: NEGATIVE

## 2020-07-07 LAB — TSH: TSH: 1.052 u[IU]/mL (ref 0.350–4.500)

## 2020-07-07 MED ORDER — ALBUTEROL SULFATE (2.5 MG/3ML) 0.083% IN NEBU: 2.5000 mg | INHALATION_SOLUTION | Freq: Four times a day (QID) | RESPIRATORY_TRACT | Status: DC | PRN

## 2020-07-07 MED ORDER — ACETAMINOPHEN 325 MG PO TABS: 650.0000 mg | ORAL_TABLET | Freq: Four times a day (QID) | ORAL | Status: DC | PRN

## 2020-07-07 MED ORDER — AMLODIPINE BESYLATE 5 MG PO TABS: 5.0000 mg | ORAL_TABLET | Freq: Every day | ORAL | Status: DC

## 2020-07-07 MED ORDER — LORAZEPAM 1 MG PO TABS: 1.0000 mg | ORAL_TABLET | ORAL | Status: DC | PRN

## 2020-07-07 MED ORDER — ONDANSETRON HCL 4 MG PO TABS: 4.0000 mg | ORAL_TABLET | Freq: Four times a day (QID) | ORAL | Status: DC | PRN

## 2020-07-07 MED ORDER — FOLIC ACID 1 MG PO TABS
1.0000 mg | ORAL_TABLET | Freq: Every day | ORAL | Status: DC
  Administered 2020-07-07 – 2020-07-09 (×3): 1 mg via ORAL
  Filled 2020-07-07 (×3): qty 1

## 2020-07-07 MED ORDER — THIAMINE HCL 100 MG/ML IJ SOLN: 100.0000 mg | Freq: Every day | INTRAMUSCULAR | Status: DC

## 2020-07-07 MED ORDER — LORAZEPAM 2 MG/ML IJ SOLN
1.0000 mg | INTRAMUSCULAR | Status: DC | PRN
Start: 2020-07-07 — End: 2020-07-09

## 2020-07-07 MED ORDER — ADULT MULTIVITAMIN W/MINERALS CH
1.0000 | ORAL_TABLET | Freq: Every day | ORAL | Status: DC
  Administered 2020-07-07 – 2020-07-09 (×3): 1 via ORAL
  Filled 2020-07-07 (×3): qty 1

## 2020-07-07 MED ORDER — ASPIRIN EC 81 MG PO TBEC
81.0000 mg | DELAYED_RELEASE_TABLET | Freq: Every day | ORAL | Status: DC
  Administered 2020-07-07 – 2020-07-09 (×3): 81 mg via ORAL
  Filled 2020-07-07 (×3): qty 1

## 2020-07-07 MED ORDER — ONDANSETRON HCL 4 MG/2ML IJ SOLN: 4.0000 mg | Freq: Four times a day (QID) | INTRAMUSCULAR | Status: DC | PRN

## 2020-07-07 MED ORDER — SODIUM CHLORIDE 0.9 % IV BOLUS
1000.0000 mL | Freq: Once | INTRAVENOUS | Status: AC
  Administered 2020-07-07: 1000 mL via INTRAVENOUS

## 2020-07-07 MED ORDER — ACETAMINOPHEN 650 MG RE SUPP: 650.0000 mg | Freq: Four times a day (QID) | RECTAL | Status: DC | PRN

## 2020-07-07 MED ORDER — HEPARIN SODIUM (PORCINE) 5000 UNIT/ML IJ SOLN
5000.0000 [IU] | Freq: Three times a day (TID) | INTRAMUSCULAR | Status: DC
  Administered 2020-07-07 – 2020-07-09 (×6): 5000 [IU] via SUBCUTANEOUS
  Filled 2020-07-07 (×6): qty 1

## 2020-07-07 MED ORDER — THIAMINE HCL 100 MG PO TABS
100.0000 mg | ORAL_TABLET | Freq: Every day | ORAL | Status: DC
  Administered 2020-07-07 – 2020-07-09 (×3): 100 mg via ORAL
  Filled 2020-07-07 (×3): qty 1

## 2020-07-07 MED ORDER — SODIUM CHLORIDE 0.9% FLUSH
3.0000 mL | Freq: Two times a day (BID) | INTRAVENOUS | Status: DC
  Administered 2020-07-07 – 2020-07-08 (×3): 3 mL via INTRAVENOUS

## 2020-07-07 MED ORDER — SODIUM CHLORIDE 0.9 % IV SOLN: INTRAVENOUS | Status: DC

## 2020-07-07 MED ORDER — ATORVASTATIN CALCIUM 80 MG PO TABS
80.0000 mg | ORAL_TABLET | Freq: Every day | ORAL | Status: DC
  Administered 2020-07-07 – 2020-07-09 (×3): 80 mg via ORAL
  Filled 2020-07-07 (×3): qty 1

## 2020-07-07 MED ORDER — NICOTINE 14 MG/24HR TD PT24
14.0000 mg | MEDICATED_PATCH | Freq: Every day | TRANSDERMAL | Status: DC
  Administered 2020-07-07 – 2020-07-09 (×3): 14 mg via TRANSDERMAL
  Filled 2020-07-07 (×3): qty 1

## 2020-07-07 NOTE — Plan of Care (Signed)
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
## 2020-07-08 ENCOUNTER — Inpatient Hospital Stay (HOSPITAL_COMMUNITY): Payer: Medicare Other

## 2020-07-08 DIAGNOSIS — R55 Syncope and collapse: Secondary | ICD-10-CM | POA: Diagnosis not present

## 2020-07-08 LAB — CBC
HCT: 38 % — ABNORMAL LOW (ref 39.0–52.0)
Hemoglobin: 12.9 g/dL — ABNORMAL LOW (ref 13.0–17.0)
MCH: 31.1 pg (ref 26.0–34.0)
MCHC: 33.9 g/dL (ref 30.0–36.0)
MCV: 91.6 fL (ref 80.0–100.0)
Platelets: 170 10*3/uL (ref 150–400)
RBC: 4.15 MIL/uL — ABNORMAL LOW (ref 4.22–5.81)
RDW: 13.3 % (ref 11.5–15.5)
WBC: 5.7 10*3/uL (ref 4.0–10.5)
nRBC: 0 % (ref 0.0–0.2)

## 2020-07-08 LAB — BASIC METABOLIC PANEL
Anion gap: 5 (ref 5–15)
BUN: 25 mg/dL — ABNORMAL HIGH (ref 8–23)
CO2: 26 mmol/L (ref 22–32)
Calcium: 8.2 mg/dL — ABNORMAL LOW (ref 8.9–10.3)
Chloride: 107 mmol/L (ref 98–111)
Creatinine, Ser: 1.64 mg/dL — ABNORMAL HIGH (ref 0.61–1.24)
GFR, Estimated: 44 mL/min — ABNORMAL LOW (ref >60)
Glucose, Bld: 199 mg/dL — ABNORMAL HIGH (ref 70–99)
Potassium: 3.7 mmol/L (ref 3.5–5.1)
Sodium: 138 mmol/L (ref 135–145)

## 2020-07-08 LAB — RAPID URINE DRUG SCREEN, HOSP PERFORMED
Amphetamines: NOT DETECTED
Barbiturates: NOT DETECTED
Benzodiazepines: NOT DETECTED
Cocaine: POSITIVE — AB
Opiates: NOT DETECTED
Tetrahydrocannabinol: POSITIVE — AB

## 2020-07-08 LAB — MAGNESIUM: Magnesium: 1.8 mg/dL (ref 1.7–2.4)

## 2020-07-08 LAB — PHOSPHORUS: Phosphorus: 2 mg/dL — ABNORMAL LOW (ref 2.5–4.6)

## 2020-07-08 MED ORDER — POTASSIUM & SODIUM PHOSPHATES 280-160-250 MG PO PACK
1.0000 | PACK | Freq: Three times a day (TID) | ORAL | Status: DC
  Administered 2020-07-08 – 2020-07-09 (×3): 1 via ORAL
  Filled 2020-07-08 (×4): qty 1

## 2020-07-08 NOTE — Plan of Care (Signed)

## 2020-07-08 NOTE — Progress Notes (Signed)
Noted PT Evaluation re-ordered this afternoon. Please see PT Evaluation Note from morning of 07/08/20 - pt independent with mobility, negative orthostatic hypotension, no c/o dizziness, no overt instability or LOB. Pt with no acute PT or follow-up needs. Please reconsult if new needs arise.  Mabeline Caras, PT, DPT Acute Rehabilitation Services  Pager [phone] Office [phone]

## 2020-07-08 NOTE — Plan of Care (Signed)

## 2020-07-08 NOTE — Evaluation (Signed)
Physical Therapy Evaluation & Discharge Patient Details Name: Christian Black MRN: [id number] DOB: Aug 11, 1945 Today's Date: 07/08/2020   History of Present Illness  Pt is a 75 y.o. male admitted 07/07/20 after having syncopal episodes, instability and AMS at home after episodes of nausea/vomiting. (+) orthostatic hypotension in ED. Head CT showed age-indeterminate infarct of R thalamus. Workup for dehydration, AKI on CKD. MRI negative for acute infarct; chronic lacunar infarcts. PMH includes HTN, CAD, COPD, CKD, colon CA s/p resection, polysubstance use.    Clinical Impression  Patient evaluated by Physical Therapy with no further acute PT needs identified. PTA, pt independent, retired from work, lives with girlfriend and friends, enjoys riding his bike. Today, pt independent with mobility; asymptomatic with no c/o dizziness. All education has been completed and the patient has no further questions. Acute PT is signing off. Thank you for this referral.  Orthostatic BPs Sitting 110/87  Standing 105/65  Standing after 2-min 109/67  Post-ambulation 134/70      Follow Up Recommendations No PT follow up    Equipment Recommendations  None recommended by PT    Recommendations for Other Services       Precautions / Restrictions Precautions Precautions: Other (comment) Precaution Comments: H/o syncopal episodes; negative orthostatic hypotension, asymptomatic Restrictions Weight Bearing Restrictions: No      Mobility  Bed Mobility               General bed mobility comments: Received sitting EOB    Transfers Overall transfer level: Independent Equipment used: None                Ambulation/Gait Ambulation/Gait assistance: Independent Gait Distance (Feet): 350 Feet Assistive device: IV Pole;None Gait Pattern/deviations: Step-through pattern;Decreased stride length Gait velocity: Decreased   General Gait Details: Slow, steady gait independent with and without pushing  IV pole; no overt instability or LOB; pt denies dizziness  Stairs            Wheelchair Mobility    Modified Rankin (Stroke Patients Only)       Balance Overall balance assessment: No apparent balance deficits (not formally assessed)                                           Pertinent Vitals/Pain Pain Assessment: No/denies pain    Home Living Family/patient expects to be discharged to:: Private residence Living Arrangements: Spouse/significant other;Non-relatives/Friends Available Help at Discharge: Friend(s);Available 24 hours/day Type of Home: House Home Access: Stairs to enter Entrance Stairs-Rails: Right Entrance Stairs-Number of Steps: 3 Home Layout: One level Home Equipment: Environmental consultant - 2 wheels Additional Comments: Lives with girlfriend and friends    Prior Function Level of Independence: Independent         Comments: Retired from work. Enjoys riding his bicycle     Hand Dominance        Extremity/Trunk Assessment   Upper Extremity Assessment Upper Extremity Assessment: Overall WFL for tasks assessed    Lower Extremity Assessment Lower Extremity Assessment: Overall WFL for tasks assessed    Cervical / Trunk Assessment Cervical / Trunk Assessment: Kyphotic  Communication   Communication: HOH  Cognition Arousal/Alertness: Awake/alert Behavior During Therapy: WFL for tasks assessed/performed Overall Cognitive Status: Within Functional Limits for tasks assessed  General Comments: WFL for simple tasks; not formally assessed      General Comments      Exercises     Assessment/Plan    PT Assessment Patent does not need any further PT services  PT Problem List         PT Treatment Interventions      PT Goals (Current goals can be found in the Care Plan section)  Acute Rehab PT Goals PT Goal Formulation: All assessment and education complete, DC therapy    Frequency      Barriers to discharge        Co-evaluation               AM-PAC PT "6 Clicks" Mobility  Outcome Measure Help needed turning from your back to your side while in a flat bed without using bedrails?: None Help needed moving from lying on your back to sitting on the side of a flat bed without using bedrails?: None Help needed moving to and from a bed to a chair (including a wheelchair)?: None Help needed standing up from a chair using your arms (e.g., wheelchair or bedside chair)?: None Help needed to walk in hospital room?: None Help needed climbing 3-5 steps with a railing? : None 6 Click Score: 24    End of Session   Activity Tolerance: Patient tolerated treatment well Patient left: in bed;with call bell/phone within reach Nurse Communication: Mobility status PT Visit Diagnosis: Other abnormalities of gait and mobility (R26.89)    Time: 4327-6147 PT Time Calculation (min) (ACUTE ONLY): 18 min   Charges:   PT Evaluation $PT Eval Low Complexity: 1 Low     Mabeline Caras, PT, DPT Acute Rehabilitation Services  Pager [phone] Office [place] 07/08/2020, 8:31 AM

## 2020-07-08 NOTE — Progress Notes (Signed)
PROGRESS NOTE  Christian Black BJY:[id number] DOB: [birth year] DOA: 07/07/2020 PCP: Binnie Rail, MD  HPI/Recap of past 24 hours: Christian Black is a 75 y.o. male with medical history significant of HTN, CAD, COPD, CKD stage II/III, and Colon cancer s/p resection who presents after having a syncopal episodes at home.  History is obtained from the patient and his fiance present at bedside.  Apparently this morning he was taking home blood pressure medications and looked dehydrated after recently getting back trip with his friends.  On the trip patient had not taken any of his blood pressure medications.  Shortly after taking his medications patient vomited.  Emesis was reported to be green and white like stomach contents and no blood was appreciated.  He had gone to sit down and while he was sitting in the chair he reportedly passed out several times.   His fiance was present and reports seeing him go in and out of consciousness several times.  Denying seeing any seizure activity or patient being incontinent of bowel or urine.   07/08/20: Patient was seen and examined at his bedside.  He denies having any prodrome prior to passing out.  Currently he denies any cardiopulmonary symptoms.  UDS positive for THC and cocaine.  Assessment/Plan: Principal Problem:   Syncope Active Problems:   Acute renal failure superimposed on chronic kidney disease (HCC)   Essential hypertension   S/P CABG x 5   Abnormal CT of brain   Polysubstance abuse (HCC)   History of colon cancer  Syncope possibly vasovagal versus orthostatic hypotension Obtain 2D echo to rule out intracardiac structural abnormality. UDS positive for THC and cocaine. Avoid beta-blockers PT OT assessment Fall precautions  AKI on CKD 3B likely prerenal in the setting of dehydration Baseline creatinine appears to be 1.1 with GFR greater than 60 Presented with creatinine of 3.05 with GFR of 21 Creatinine downtrending with IV fluid Monitor  urine output Avoid nephrotoxic agents Repeat renal panel in the morning.  Coronary artery disease Denies any anginal symptoms at the time of this visit Continue aspirin and Lipitor 80 mg daily.  Polysubstance abuse including cocaine, THC use, tobacco and alcohol use UDS positive for THC and cocaine on 07/08/2020. Cessation counseling at bedside  Alcohol use disorder Continue CIWA protocol Continue multivitamins, thiamine and folic acid supplements No evidence of alcohol withdrawal at time of this visit. Continue to monitor  Hypophosphatemia Serum phosphorus 2.0 Replete as indicated   Code Status: Full code  Family Communication: None at bedside.  Disposition Plan: Likely will discharge to home once syncope work-up is completed.   Consultants:  None  Procedures:  2D echo  Antimicrobials:  None  DVT prophylaxis: SCDs.  Status is: Inpatient   Dispo:  Patient From: Home  Planned Disposition: Home possibly on 07/09/2020 once syncope work-up is completed.  Medically stable for discharge: No, ongoing management of syncope and orthostatic hypotension.          Objective: Vitals:   07/07/20 1513 07/07/20 1516 07/07/20 2117 07/08/20 0420  BP: (!) 142/75  121/68 (!) 105/56  Pulse: 72  63 63  Resp: 17  16 18   Temp: 97.8 F (36.6 C)  97.9 F (36.6 C) 98.8 F (37.1 C)  TempSrc: Oral Bladder Oral Oral  SpO2: 99%  99% 96%  Weight:  79.3 kg  79.7 kg  Height:  6\' 4"  (1.93 m)      Intake/Output Summary (Last 24 hours) at 07/08/2020 1421 Last data  filed at 07/08/2020 1339 Gross per 24 hour  Intake 3220.3 ml  Output 935 ml  Net 2285.3 ml   Filed Weights   07/07/20 1516 07/08/20 0420  Weight: 79.3 kg 79.7 kg    Exam:  . General: 75 y.o. year-old male well developed well nourished in no acute distress.  Alert and oriented x3. . Cardiovascular: Regular rate and rhythm with no rubs or gallops.  No thyromegaly or JVD noted.   Marland Kitchen Respiratory: Clear to  auscultation with no wheezes or rales. Good inspiratory effort. . Abdomen: Soft nontender nondistended with normal bowel sounds x4 quadrants. . Musculoskeletal: No lower extremity edema.  . Skin: No ulcerative lesions noted or rashes . Psychiatry: Mood is appropriate for condition and setting   Data Reviewed: CBC: Recent Labs  Lab 07/07/20 1206 07/08/20 0344  WBC 6.7 5.7  NEUTROABS 5.2  --   HGB 15.1 12.9*  HCT 45.9 38.0*  MCV 92.9 91.6  PLT 203 941   Basic Metabolic Panel: Recent Labs  Lab 07/07/20 1206 07/08/20 0344  NA 136 138  K 4.3 3.7  CL 98 107  CO2 26 26  GLUCOSE 154* 199*  BUN 37* 25*  CREATININE 3.05* 1.64*  CALCIUM 9.2 8.2*  MG  --  1.8  PHOS  --  2.0*   GFR: Estimated Creatinine Clearance: 45.9 mL/min (A) (by C-G formula based on SCr of 1.64 mg/dL (H)). Liver Function Tests: Recent Labs  Lab 07/07/20 1206  AST 32  ALT 29  ALKPHOS 63  BILITOT 1.1  PROT 7.0  ALBUMIN 3.6   No results for input(s): LIPASE, AMYLASE in the last 168 hours. No results for input(s): AMMONIA in the last 168 hours. Coagulation Profile: No results for input(s): INR, PROTIME in the last 168 hours. Cardiac Enzymes: Recent Labs  Lab 07/07/20 1601  CKTOTAL 92   BNP (last 3 results) No results for input(s): PROBNP in the last 8760 hours. HbA1C: Recent Labs    07/07/20 1601  HGBA1C 6.1*   CBG: Recent Labs  Lab 07/07/20 1230  GLUCAP 154*   Lipid Profile: No results for input(s): CHOL, HDL, LDLCALC, TRIG, CHOLHDL, LDLDIRECT in the last 72 hours. Thyroid Function Tests: Recent Labs    07/07/20 1601  TSH 1.052   Anemia Panel: No results for input(s): VITAMINB12, FOLATE, FERRITIN, TIBC, IRON, RETICCTPCT in the last 72 hours. Urine analysis:    Component Value Date/Time   COLORURINE STRAW (A) 07/07/2020 2035   APPEARANCEUR CLEAR 07/07/2020 2035   LABSPEC 1.005 07/07/2020 2035   PHURINE 6.0 07/07/2020 2035   GLUCOSEU 150 (A) 07/07/2020 2035   HGBUR  NEGATIVE 07/07/2020 2035   BILIRUBINUR NEGATIVE 07/07/2020 2035   [place] NEGATIVE 07/07/2020 2035   PROTEINUR NEGATIVE 07/07/2020 2035   NITRITE NEGATIVE 07/07/2020 2035   LEUKOCYTESUR NEGATIVE 07/07/2020 2035   Sepsis Labs: @LABRCNTIP (procalcitonin:4,lacticidven:4)  ) Recent Results (from the past 240 hour(s))  Resp Panel by RT-PCR (Flu A&B, Covid) Nasopharyngeal Swab     Status: None   Collection Time: 07/07/20  2:03 PM   Specimen: Nasopharyngeal Swab; Nasopharyngeal(NP) swabs in vial transport medium  Result Value Ref Range Status   SARS Coronavirus 2 by RT PCR NEGATIVE NEGATIVE Final    Comment: (NOTE) SARS-CoV-2 target nucleic acids are NOT DETECTED.  The SARS-CoV-2 RNA is generally detectable in upper respiratory specimens during the acute phase of infection. The lowest concentration of SARS-CoV-2 viral copies this assay can detect is 138 copies/mL. A negative result does not preclude SARS-Cov-2  infection and should not be used as the sole basis for treatment or other patient management decisions. A negative result may occur with  improper specimen collection/handling, submission of specimen other than nasopharyngeal swab, presence of viral mutation(s) within the areas targeted by this assay, and inadequate number of viral copies(<138 copies/mL). A negative result must be combined with clinical observations, patient history, and epidemiological information. The expected result is Negative.  Fact Sheet for Patients:  EntrepreneurPulse.com.au  Fact Sheet for Healthcare Providers:  IncredibleEmployment.be  This test is no t yet approved or cleared by the [place] FDA and  has been authorized for detection and/or diagnosis of SARS-CoV-2 by FDA under an Emergency Use Authorization (EUA). This EUA will remain  in effect (meaning this test can be used) for the duration of the COVID-19 declaration under Section 564(b)(1) of the Act,  21 U.S.C.section 360bbb-3(b)(1), unless the authorization is terminated  or revoked sooner.       Influenza A by PCR NEGATIVE NEGATIVE Final   Influenza B by PCR NEGATIVE NEGATIVE Final    Comment: (NOTE) The Xpert Xpress SARS-CoV-2/FLU/RSV plus assay is intended as an aid in the diagnosis of influenza from Nasopharyngeal swab specimens and should not be used as a sole basis for treatment. Nasal washings and aspirates are unacceptable for Xpert Xpress SARS-CoV-2/FLU/RSV testing.  Fact Sheet for Patients: EntrepreneurPulse.com.au  Fact Sheet for Healthcare Providers: IncredibleEmployment.be  This test is not yet approved or cleared by the [place] FDA and has been authorized for detection and/or diagnosis of SARS-CoV-2 by FDA under an Emergency Use Authorization (EUA). This EUA will remain in effect (meaning this test can be used) for the duration of the COVID-19 declaration under Section 564(b)(1) of the Act, 21 U.S.C. section 360bbb-3(b)(1), unless the authorization is terminated or revoked.  Performed at [facility], [place] [address], [place], [place] [address]       Studies: MR BRAIN WO CONTRAST  Result Date: 07/08/2020 CLINICAL DATA:  Concern for stroke.  Abnormal CT. EXAM: MRI HEAD WITHOUT CONTRAST TECHNIQUE: Multiplanar, multiecho pulse sequences of the brain and surrounding structures were obtained without intravenous contrast. COMPARISON:  CT head July 07, 2020. FINDINGS: Brain: No acute infarct. 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 Moderate additional scattered T2/FLAIR hyperintensities, most likely related to chronic microvascular ischemic disease. Mild generalized cerebral atrophy. Redemonstrated mild ventriculomegaly. No extra-axial fluid collections. No mass lesion or abnormal mass effect. No acute hemorrhage. Vascular: Major arterial flow voids are maintained at the skull base. Skull and upper cervical  spine: Normal marrow signal. Sinuses/Orbits: Mild mucosal thickening of ethmoid and maxillary sinuses with left maxillary sinus retention cyst. Unremarkable orbits. Other: Small left mastoid effusion. IMPRESSION: 1. No acute infarct. 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 2. Similar mild ventriculomegaly, which could be related to central predominant volume loss or normal pressure hydrocephalus in the correct clinical setting. 3. Moderate chronic microvascular ischemic disease. Electronically Signed   By: Margaretha Sheffield MD   On: 07/08/2020 07:56   US RENAL  Result Date: 07/07/2020 CLINICAL DATA:  Acute renal failure. EXAM: RENAL / URINARY TRACT ULTRASOUND COMPLETE COMPARISON:  None. FINDINGS: Right Kidney: Renal measurements: 9.1 cm x 4.2 cm x 4.2 cm = volume: 84.82 mL. Echogenicity within normal limits. No mass or hydronephrosis visualized. Left Kidney: Renal measurements: 8.5 cm x 5.6 cm x 3.8 cm = volume: 85.82 mL. Echogenicity within normal limits. A 2.4 cm x 2.0 cm x 2.3 cm  anechoic structure is seen within the lower pole of the left kidney. No abnormal flow is noted within this region on color Doppler evaluation. No hydronephrosis visualized. Bladder: Appears normal for degree of bladder distention. Other: None. IMPRESSION: Simple left renal cyst. Electronically Signed   By: Virgina Norfolk M.D.   On: 07/07/2020 20:45    Scheduled Meds: . aspirin EC  81 mg Oral Daily  . atorvastatin  80 mg Oral Daily  . folic acid  1 mg Oral Daily  . heparin  5,000 Units Subcutaneous Q8H  . multivitamin with minerals  1 tablet Oral Daily  . nicotine  14 mg Transdermal Daily  . sodium chloride flush  3 mL Intravenous Q12H  . thiamine  100 mg Oral Daily   Or  . thiamine  100 mg Intravenous Daily    Continuous Infusions: . sodium chloride 125 mL/hr at 07/08/20 1339     LOS: 1 day     Kayleen Memos, MD [facility] Pager [phone]  If 7PM-7AM, please contact  night-coverage [email or web address] Password [email or web address] 07/08/2020, 2:21 PM

## 2020-07-09 ENCOUNTER — Inpatient Hospital Stay (HOSPITAL_COMMUNITY): Payer: Medicare Other

## 2020-07-09 DIAGNOSIS — R55 Syncope and collapse: Secondary | ICD-10-CM

## 2020-07-09 DIAGNOSIS — F1210 Cannabis abuse, uncomplicated: Secondary | ICD-10-CM | POA: Diagnosis not present

## 2020-07-09 DIAGNOSIS — N179 Acute kidney failure, unspecified: Secondary | ICD-10-CM | POA: Diagnosis not present

## 2020-07-09 DIAGNOSIS — I340 Nonrheumatic mitral (valve) insufficiency: Secondary | ICD-10-CM | POA: Diagnosis not present

## 2020-07-09 DIAGNOSIS — Z20822 Contact with and (suspected) exposure to covid-19: Secondary | ICD-10-CM | POA: Diagnosis not present

## 2020-07-09 DIAGNOSIS — I361 Nonrheumatic tricuspid (valve) insufficiency: Secondary | ICD-10-CM

## 2020-07-09 LAB — ECHOCARDIOGRAM COMPLETE
AR max vel: 4.54 cm2
AV Area VTI: 4.59 cm2
AV Area mean vel: 5.08 cm2
AV Mean grad: 2 mmHg
AV Peak grad: 4.8 mmHg
Ao pk vel: 1.09 m/s
Area-P 1/2: 2.5 cm2
Calc EF: 70.6 %
Height: 76 in
MV M vel: 4.53 m/s
MV Peak grad: 82.1 mmHg
S' Lateral: 3.4 cm
Single Plane A2C EF: 67.5 %
Single Plane A4C EF: 72.8 %
Weight: 2822.4 oz

## 2020-07-09 LAB — BASIC METABOLIC PANEL
Anion gap: 3 — ABNORMAL LOW (ref 5–15)
BUN: 9 mg/dL (ref 8–23)
CO2: 28 mmol/L (ref 22–32)
Calcium: 8.3 mg/dL — ABNORMAL LOW (ref 8.9–10.3)
Chloride: 110 mmol/L (ref 98–111)
Creatinine, Ser: 1.19 mg/dL (ref 0.61–1.24)
GFR, Estimated: >60 mL/min (ref >60)
Glucose, Bld: 167 mg/dL — ABNORMAL HIGH (ref 70–99)
Potassium: 3.7 mmol/L (ref 3.5–5.1)
Sodium: 141 mmol/L (ref 135–145)

## 2020-07-09 LAB — MAGNESIUM: Magnesium: 1.7 mg/dL (ref 1.7–2.4)

## 2020-07-09 MED ORDER — ADULT MULTIVITAMIN W/MINERALS CH: 1.0000 | ORAL_TABLET | Freq: Every day | ORAL | 0 refills | Status: AC

## 2020-07-09 MED ORDER — THIAMINE HCL 100 MG PO TABS: 100.0000 mg | ORAL_TABLET | Freq: Every day | ORAL | 0 refills | Status: AC

## 2020-07-09 MED ORDER — NICOTINE 14 MG/24HR TD PT24: 14.0000 mg | MEDICATED_PATCH | Freq: Every day | TRANSDERMAL | 0 refills | Status: DC

## 2020-07-09 MED ORDER — FOLIC ACID 1 MG PO TABS: 1.0000 mg | ORAL_TABLET | Freq: Every day | ORAL | 0 refills | Status: AC

## 2020-07-09 NOTE — TOC Initial Note (Signed)
Transition of Care [facility]) - Initial/Assessment Note    Patient Details  Name: Christian Black MRN: [id number] Date of Birth: [birth year]  Transition of [facility]) CM/SW Contact:    Oretha Milch, LCSW Phone Number: 07/09/2020, 2:38 PM  Clinical Narrative: CSW contacted by [facility] peer regarding patient discharging today and requested TOC to screen for substance use and supports. CSW met with patient and introduced self and role. CSW noted patient had visitor present in the room and asked if patient wanted them to step out during conversation regarding substance use. CSW noted patient requested visitor (significant other) to stay in the room. Patient stated "There's nothing to talk about. I partake in things from time to time but never had a habit. I feel it's fine." CSW utilized motivational interviewing skills to engage patient in substance use recovery and noted patient affirmed he feels he is fine and does not need treatment but would accept resources if he changes his mind. CSW provided patient with local treatment options and encouraged patient to consider. Patient declined further substance use screen or conversation. Please reach out to [facility] for additional discharge supports.                   Expected Discharge Plan: Home/Self Care     Patient Goals and CMS Choice Patient states their goals for this hospitalization and ongoing recovery are:: "I just wanna go home and not have to come back."      Expected Discharge Plan and Services Expected Discharge Plan: Home/Self Care     Post Acute Care Choice: NA   Expected Discharge Date: 07/09/20                                    Prior Living Arrangements/Services   Lives with:: Significant Other Patient language and need for interpreter reviewed:: Yes Do you feel safe going back to the place where you live?: Yes      Need for Family Participation in Patient Care: No (Comment) Care giver support system in place?: No (comment)    Criminal Activity/Legal Involvement Pertinent to Current Situation/Hospitalization: No - Comment as needed  Activities of Daily Living Home Assistive Devices/Equipment: Eyeglasses ADL Screening (condition at time of admission) Patient's cognitive ability adequate to safely complete daily activities?: Yes Is the patient deaf or have difficulty hearing?: No Does the patient have difficulty seeing, even when wearing glasses/contacts?: No Does the patient have difficulty concentrating, remembering, or making decisions?: No Patient able to express need for assistance with ADLs?: Yes Does the patient have difficulty dressing or bathing?: No Independently performs ADLs?: Yes (appropriate for developmental age) Does the patient have difficulty walking or climbing stairs?: No Weakness of Legs: None Weakness of Arms/Hands: None  Permission Sought/Granted Permission sought to share information with : Family Supports Permission granted to share information with : Yes, Verbal Permission Granted  Share Information with NAME: Significant other in room during screen, patient requested them to remain present           Emotional Assessment Appearance:: Disheveled,Appears older than stated age Attitude/Demeanor/Rapport: Guarded Affect (typically observed): Blunt,Defensive Orientation: : Oriented to Self,Oriented to Place,Oriented to  Time,Oriented to Situation Alcohol / Substance Use: Illicit Drugs,Alcohol Use,Tobacco Use Psych Involvement: No (comment)  Admission diagnosis:  Syncope and collapse [R55] Syncope [R55] ARF (acute renal failure) (HCC) [N17.9] AKI (acute kidney injury) ([place]) [N17.9] Patient Active Problem List  Diagnosis Date Noted  . Syncope 07/07/2020  . Abnormal CT of brain 07/07/2020  . Polysubstance abuse ([place]) 07/07/2020  . History of colon cancer 07/07/2020  . Pure hypercholesterolemia 03/22/2020  . 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 Malnutrition of moderate degree 06/04/2016   . CAD (coronary artery disease), native coronary artery 05/30/2016  . Abnormal stress test   . Essential hypertension 05/23/2016  . History of gout 11/01/2015  . COPD (chronic obstructive pulmonary disease) with emphysema ([place]) 11/01/2015  . Colon cancer, ascending ([employer]) 05/27/2015  . Acute renal failure superimposed on chronic kidney disease ([place]) 05/21/2015  . Mass of colon s/p lap assisated right colon resection 05/27/15 05/20/2015   PCP:  Binnie Rail, MD Pharmacy:   [facility], [place] [address] Phone: [phone] Fax: [phone]     Social Determinants of Health (SDOH) Interventions    Readmission Risk Interventions No flowsheet data found.

## 2020-07-09 NOTE — Evaluation (Signed)
Occupational Therapy Evaluation Patient Details Name: Christian Black MRN: [id number] DOB: 1945/07/07 Today's Date: 07/09/2020    History of Present Illness Pt is a 75 y.o. male admitted 07/07/20 after having syncopal episodes, instability and AMS at home after episodes of nausea/vomiting. (+) orthostatic hypotension in ED. Head CT showed age-indeterminate infarct of R thalamus. Workup for dehydration, AKI on CKD. MRI negative for acute infarct; chronic lacunar infarcts. PMH includes HTN, CAD, COPD, CKD, colon CA s/p resection, polysubstance use.   Clinical Impression   Pt admitted with syncope episode. Pt was able to complete all sitting and standing ADLS with no AE and functional ambulation with no LOB or AE. Pt had the following vitals in session: sitting: HR 53 o2100% bp 146/74 standing: hr 59 o2 99% bp 135/79 post ambulation: hr 51 o2 100% bp 161/70. Occupational Therapy signing off.      Follow Up Recommendations  No OT follow up    Equipment Recommendations       Recommendations for Other Services       Precautions / Restrictions        Mobility Bed Mobility Overal bed mobility: Independent                  Transfers Overall transfer level: Independent Equipment used: None                  Balance Overall balance assessment: No apparent balance deficits (not formally assessed)                                         ADL either performed or assessed with clinical judgement   ADL Overall ADL's : Independent (pt just educated on use of proper footware)                                             Vision Baseline Vision/History: Wears glasses       Perception     Praxis      Pertinent Vitals/Pain Pain Assessment: No/denies pain     Hand Dominance Right   Extremity/Trunk Assessment Upper Extremity Assessment Upper Extremity Assessment: Overall WFL for tasks assessed   Lower Extremity Assessment Lower  Extremity Assessment: Defer to PT evaluation   Cervical / Trunk Assessment Cervical / Trunk Assessment: Kyphotic   Communication Communication Communication: HOH   Cognition Arousal/Alertness: Awake/alert Behavior During Therapy: WFL for tasks assessed/performed Overall Cognitive Status: Within Functional Limits for tasks assessed                                 General Comments: WFL for simple tasks; not formally assessed   General Comments       Exercises     Shoulder Instructions      Home Living Family/patient expects to be discharged to:: Private residence Living Arrangements: Spouse/significant other;Non-relatives/Friends Available Help at Discharge: Friend(s);Available 24 hours/day Type of Home: House Home Access: Stairs to enter [employer] of Steps: 3 Entrance Stairs-Rails: Right Home Layout: One level     Bathroom Shower/Tub: Occupational psychologist: Standard Bathroom Accessibility: Yes How Accessible: Accessible via walker Home Equipment: [place] - 2 wheels   Additional Comments: Lives with girlfriend and friends  Prior Functioning/Environment Level of Independence: Independent        Comments: Retired from work. Enjoys riding his bicycle        OT Problem List: Decreased activity tolerance      OT Treatment/Interventions:      OT Goals(Current goals can be found in the care plan section) Acute Rehab OT Goals Patient Stated Goal: To go home today OT Goal Formulation: With patient Time For Goal Achievement: 07/16/20 Potential to Achieve Goals: Good  OT Frequency:     Barriers to D/C:            Co-evaluation              AM-PAC OT "6 Clicks" Daily Activity     Outcome Measure Help from another person eating meals?: None Help from another person taking care of personal grooming?: None Help from another person toileting, which includes using toliet, bedpan, or urinal?: None Help from another  person bathing (including washing, rinsing, drying)?: None Help from another person to put on and taking off regular upper body clothing?: None Help from another person to put on and taking off regular lower body clothing?: None 6 Click Score: 24   End of Session Equipment Utilized During Treatment: Gait belt  Activity Tolerance: Patient tolerated treatment well Patient left: in bed;with family/visitor present  OT Visit Diagnosis: Dizziness and giddiness (R42)                Time: 8101-7510 OT Time Calculation (min): 13 min Charges:  OT General Charges $OT Visit: 1 Visit OT Evaluation $OT Eval Low Complexity: 1 Low  [name] OTR/L  Acute Rehab Services  [phone] office number [phone] pager number   [name] 07/09/2020, 2:31 PM

## 2020-07-09 NOTE — Discharge Summary (Signed)
Discharge Summary  Christian Black GHW:[id number] DOB: 05/19/1945  PCP: Binnie Rail, MD  Admit date: 07/07/2020 Discharge date: 07/09/2020  Time spent: 35 minutes.  Recommendations for Outpatient Follow-up:  1. Follow-up with your primary care provider within a week. 2. Follow-up with your cardiologist in 1 to 2 weeks. 3. Completely abstain from cocaine and THC use. 4. Take your medications as prescribed.  Discharge Diagnoses:  Active Hospital Problems   Diagnosis Date Noted  . Syncope 07/07/2020  . Abnormal CT of brain 07/07/2020  . Polysubstance abuse ([place]) 07/07/2020  . History of colon cancer 07/07/2020  . 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 Essential hypertension 05/23/2016  . Acute renal failure superimposed on chronic kidney disease ([place]) 05/21/2015    Resolved Hospital Problems  No resolved problems to display.    Discharge Condition: Stable.  Diet recommendation: Resume previous diet.  Vitals:   07/09/20 0517 07/09/20 0900  BP: 130/80 (!) 115/56  Pulse: (!) 54 (!) 53  Resp: 18 18  Temp: 98 F (36.7 C) 98 F (36.7 C)  SpO2: 98%     History of present illness:  Christian M Austinis a 75 y.o.malewith medical history significant ofHTN,CAD,COPD,CKD stage II/III, and Colon cancers/p resectionwho presents after having a syncopal episodesat home. History is obtained from the patient and his fiance present at bedside. Apparently the morning of his presentation he was taking home blood pressure medications and looked dehydrated after recently returning from a trip with his friends. On the trip patient had not taken any of his blood pressure medications. Shortly after taking his medications patient vomited. Emesis was reported to be green and white like stomach contents and no blood was appreciated. He had gone to sit down and while he was sitting in the chair he reportedly passed out several times. His fiance was present and reports seeing him go in and out of  consciousness several times.  Denying seeing any seizure like activity or incontinence of bowel or urine.   UDS taken on 07/08/2020 positive for THC and cocaine.  Presented with AKI on CKD 3B with concern for dehydration.  Received IV fluid hydration and received bedside counseling on polysubstance abuse cessation.  2D echo done on 07/09/2020 revealed LVEF 60 to 65% with no regional wall motion abnormalities.  07/09/20: Seen and examined at his bedside.  There were no acute events overnight.  He was seen by OT with no further recommendations.  He has no new complaints.  He wants to go home.  Hospital Course:  Principal Problem:   Syncope Active Problems:   Acute renal failure superimposed on chronic kidney disease (HCC)   Essential hypertension   S/P CABG x 5   Abnormal CT of brain   Polysubstance abuse ([place])   History of colon cancer  Syncope suspect secondary to dehydration Hypovolemic on presentation.  Improved with IV fluid hydration. UDS positive for THC and cocaine.  Polysubstance abuse cessation counseling done at bedside. Seen by OT with no further recommendations. Avoid dehydration Avoid polysubstance abuse  Sinus bradycardia TSH normal, 1.05. Latest heart rate 55. Avoid beta-blockers and calcium channel blocker. Follow-up with your PCP.  Essential hypertension BPs have been soft, latest BP 115/56 Continue to hold off home oral antihypertensives Follow-up with your PCP within a week.  Resolved nonoliguric AKI on CKD 3B likely prerenal in the setting of dehydration Baseline creatinine appears to be 1.1 with GFR greater than 60 Presented with creatinine of 3.05 with GFR of 21 Creatinine  back to his baseline creatinine 1.1 with GFR greater than 60. Continue to avoid nephrotoxic agents Continue to avoid hypotension and dehydration. Follow-up with your PCP within a week.  Coronary artery disease Denies any anginal symptoms at the time of this visit Continue home aspirin  and Lipitor 80 mg daily. Follow-up with your cardiologist.  Polysubstance abuse including cocaine, THC use, tobacco and alcohol use UDS positive for THC and cocaine on 07/08/2020. Polysubstance abuse cessation counseling done at bedside  Alcohol use disorder While hospitalized he was on CIWA protocol Continue multivitamins, thiamine and folic acid supplements No evidence of alcohol withdrawal at the time of this visit.  Hypophosphatemia Serum phosphorus 2.0 Has received potassium and sodium phosphate replacement. Follow-up with your PCP.   Code Status: Full code    Consultants:  None  Procedures:  2D echo  Antimicrobials:  None    Discharge Exam: BP (!) 115/56 (BP Location: Left Arm)   Pulse (!) 53   Temp 98 F (36.7 C) (Oral)   Resp 18   Ht 6\' 4"  (1.93 m)   Wt 80 kg   SpO2 98%   BMI 21.47 kg/m  . General: 75 y.o. year-old male well developed well nourished in no acute distress.  Alert and oriented x3. . Cardiovascular: Bradycardic with a rate in the mid 50s with no rubs or gallops.  No thyromegaly or JVD noted.   Marland Kitchen Respiratory: Clear to auscultation with no wheezes or rales. Good inspiratory effort. . Abdomen: Soft nontender nondistended with normal bowel sounds x4 quadrants. . Musculoskeletal: No lower extremity edema.  . Skin: No ulcerative lesions noted or rashes. . Psychiatry: Mood is appropriate for condition and setting  Discharge Instructions You were cared for by a hospitalist during your hospital stay. If you have any questions about your discharge medications or the care you received while you were in the hospital after you are discharged, you can call the unit and asked to speak with the hospitalist on call if the hospitalist that took care of you is not available. Once you are discharged, your primary care physician will handle any further medical issues. Please note that NO REFILLS for any discharge medications will be authorized once you  are discharged, as it is imperative that you return to your primary care physician (or establish a relationship with a primary care physician if you do not have one) for your aftercare needs so that they can reassess your need for medications and monitor your lab values.   Allergies as of 07/09/2020   No Known Allergies     Medication List    STOP taking these medications   amLODipine 5 MG tablet Commonly known as: NORVASC   carvedilol 12.5 MG tablet Commonly known as: COREG   lisinopril 40 MG tablet Commonly known as: ZESTRIL     TAKE these medications   aspirin EC 81 MG tablet Take 81 mg by mouth daily.   atorvastatin 80 MG tablet Commonly known as: LIPITOR TAKE 1 TABLET BY MOUTH ONCE DAILY AT 6PM What changed: See the new instructions. Notes to patient: You have already had this medication for today. Resume tomorrow at your normal time.    folic acid 1 MG tablet Commonly known as: FOLVITE Take 1 tablet (1 mg total) by mouth daily. Start taking on: July 10, 2020   multivitamin with minerals Tabs tablet Take 1 tablet by mouth daily. Start taking on: July 10, 2020   nicotine 14 mg/24hr patch Commonly known as: NICODERM  CQ - dosed in mg/24 hours Place 1 patch (14 mg total) onto the skin daily. Start taking on: July 10, 2020   thiamine 100 MG tablet Take 1 tablet (100 mg total) by mouth daily. Start taking on: July 10, 2020      No Known Allergies  Follow-up Information    Binnie Rail, MD. Call in 1 day(s).   Specialty: Internal Medicine Why: Please call for a post hospital follow-up appointment. Contact information: [place] [address] [phone]                The results of significant diagnostics from this hospitalization (including imaging, microbiology, ancillary and laboratory) are listed below for reference.    Significant Diagnostic Studies: DG Chest 1 View  Result Date: 07/07/2020 CLINICAL DATA:  Witnessed  syncopal episodes while sitting in a chair, altered level of consciousness, history hypertension, colon cancer, former smoker EXAM: CHEST  1 VIEW COMPARISON:  Portable exam 1217 hours compared to 07/05/2016 FINDINGS: Normal heart size post CABG. Mediastinal contours and pulmonary vascularity normal. Emphysematous and bronchitic changes consistent with COPD. No acute infiltrate, pleural effusion or pneumothorax. Old LEFT rib fractures. Osseous demineralization. IMPRESSION: COPD changes without acute infiltrate. Post CABG. Electronically Signed   By: Lavonia Dana M.D.   On: 07/07/2020 12:44   CT Head Wo Contrast  Result Date: 07/07/2020 CLINICAL DATA:  Syncope. EXAM: CT HEAD WITHOUT CONTRAST TECHNIQUE: Contiguous axial images were obtained from the base of the skull through the vertex without intravenous contrast. COMPARISON:  None. FINDINGS: Brain: Hypodensity within the right thalamus, compatible with age indeterminate infarct. Mildly prominent ventricles with mild rounding of the temporal horns. No acute hemorrhage. No visible extra-axial fluid collections. No mass lesion or abnormal mass effect. Moderate patchy white matter hypoattenuation, most likely related to chronic microvascular ischemic disease. Vascular: No hyperdense vessel identified. Calcific atherosclerosis. Skull: No acute fracture. Sinuses/Orbits: Retention cyst in the left maxillary sinus. Mild ethmoid air cell mucosal thickening. No air-fluid levels. Unremarkable orbits. Other: No mastoid effusions. IMPRESSION: 1. 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 Recommend MRI to further evaluate. 2. Mildly prominent ventricular system. This finding may relate to central predominant volume loss, but normal pressure hydrocephalus could have a similar appearance in the correct clinical setting. 3. Moderate chronic microvascular ischemic disease. Electronically Signed   By: Margaretha Sheffield MD   On: 07/07/2020 14:00   MR BRAIN WO  CONTRAST  Result Date: 07/08/2020 CLINICAL DATA:  Concern for stroke.  Abnormal CT. EXAM: MRI HEAD WITHOUT CONTRAST TECHNIQUE: Multiplanar, multiecho pulse sequences of the brain and surrounding structures were obtained without intravenous contrast. COMPARISON:  CT head July 07, 2020. FINDINGS: Brain: No acute infarct. 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 Moderate additional scattered T2/FLAIR hyperintensities, most likely related to chronic microvascular ischemic disease. Mild generalized cerebral atrophy. Redemonstrated mild ventriculomegaly. No extra-axial fluid collections. No mass lesion or abnormal mass effect. No acute hemorrhage. Vascular: Major arterial flow voids are maintained at the skull base. Skull and upper cervical spine: Normal marrow signal. Sinuses/Orbits: Mild mucosal thickening of ethmoid and maxillary sinuses with left maxillary sinus retention cyst. Unremarkable orbits. Other: Small left mastoid effusion. IMPRESSION: 1. No acute infarct. 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 2. Similar mild ventriculomegaly, which could be related to central predominant volume loss or normal pressure hydrocephalus in the correct clinical setting. 3. Moderate chronic microvascular ischemic disease. Electronically Signed   By: Margaretha Sheffield  MD   On: 07/08/2020 07:56   US RENAL  Result Date: 07/07/2020 CLINICAL DATA:  Acute renal failure. EXAM: RENAL / URINARY TRACT ULTRASOUND COMPLETE COMPARISON:  None. FINDINGS: Right Kidney: Renal measurements: 9.1 cm x 4.2 cm x 4.2 cm = volume: 84.82 mL. Echogenicity within normal limits. No mass or hydronephrosis visualized. Left Kidney: Renal measurements: 8.5 cm x 5.6 cm x 3.8 cm = volume: 85.82 mL. Echogenicity within normal limits. A 2.4 cm x 2.0 cm x 2.3 cm anechoic structure is seen within the lower pole of the left kidney. No abnormal flow is noted within this region on color Doppler evaluation. No hydronephrosis  visualized. Bladder: Appears normal for degree of bladder distention. Other: None. IMPRESSION: Simple left renal cyst. Electronically Signed   By: Virgina Norfolk M.D.   On: 07/07/2020 20:45   ECHOCARDIOGRAM COMPLETE  Result Date: 07/09/2020    ECHOCARDIOGRAM REPORT   Patient Name:   Christian Black Date of Exam: 07/09/2020 Medical Rec #:  [id number]      Height:       76.0 in Accession #:    [id number]     Weight:       176.4 lb Date of Birth:  [birth year]     BSA:          2.100 m Patient Age:    32 years       BP:           130/80 mmHg Patient Gender: M              HR:           50 bpm. Exam Location:  Inpatient Procedure: 2D Echo, Cardiac Doppler and Color Doppler Indications:    Syncope  History:        Patient has prior history of Echocardiogram examinations, most                 recent 05/30/2016. COPD; Risk Factors:Hypertension.  Sonographer:    [place] Referring Phys: [id number] [place]  1. Left ventricular ejection fraction, by estimation, is 60 to 65%. The left ventricle has normal function. The left ventricle has no regional wall motion abnormalities. Left ventricular diastolic parameters were normal.  2. Right ventricular systolic function is normal. The right ventricular size is normal. There is normal pulmonary artery systolic pressure.  3. The mitral valve is normal in structure. Mild mitral valve regurgitation. No evidence of mitral stenosis.  4. The aortic valve is tricuspid. There is mild calcification of the aortic valve. Aortic valve regurgitation is not visualized. Mild aortic valve sclerosis is present, with no evidence of aortic valve stenosis.  5. Aortic dilatation noted. There is mild dilatation of the aortic arch and of the ascending aorta, measuring 38 mm.  6. The inferior vena cava is normal in size with greater than 50% respiratory variability, suggesting right atrial pressure of 3 mmHg. FINDINGS  Left Ventricle: Left ventricular ejection fraction, by  estimation, is 60 to 65%. The left ventricle has normal function. The left ventricle has no regional wall motion abnormalities. The left ventricular internal cavity size was normal in size. There is  no left ventricular hypertrophy. Left ventricular diastolic parameters were normal. Right Ventricle: The right ventricular size is normal. No increase in right ventricular wall thickness. Right ventricular systolic function is normal. There is normal pulmonary artery systolic pressure. The tricuspid regurgitant velocity is 2.66 m/s, and  with an assumed right atrial pressure of 3  mmHg, the estimated right ventricular systolic pressure is 47.0 mmHg. Left Atrium: Left atrial size was normal in size. Right Atrium: Right atrial size was normal in size. Pericardium: There is no evidence of pericardial effusion. Mitral Valve: The mitral valve is normal in structure. Mild mitral valve regurgitation. No evidence of mitral valve stenosis. Tricuspid Valve: The tricuspid valve is normal in structure. Tricuspid valve regurgitation is mild . No evidence of tricuspid stenosis. Aortic Valve: The aortic valve is tricuspid. There is mild calcification of the aortic valve. Aortic valve regurgitation is not visualized. Mild aortic valve sclerosis is present, with no evidence of aortic valve stenosis. Aortic valve mean gradient measures 2.0 mmHg. Aortic valve peak gradient measures 4.8 mmHg. Aortic valve area, by VTI measures 4.59 cm. Pulmonic Valve: The pulmonic valve was normal in structure. Pulmonic valve regurgitation is mild. No evidence of pulmonic stenosis. Aorta: The aortic root is normal in size and structure and aortic dilatation noted. There is mild dilatation of the aortic arch and of the ascending aorta, measuring 38 mm. Venous: The inferior vena cava is normal in size with greater than 50% respiratory variability, suggesting right atrial pressure of 3 mmHg. IAS/Shunts: No atrial level shunt detected by color flow Doppler.   LEFT VENTRICLE PLAX 2D LVIDd:         6.00 cm      Diastology LVIDs:         3.40 cm      LV e' medial:    6.09 cm/s LV PW:         0.90 cm      LV E/e' medial:  12.3 LV IVS:        0.60 cm      LV e' lateral:   11.90 cm/s LVOT diam:     2.80 cm      LV E/e' lateral: 6.3 LV SV:         119 LV SV Index:   57 LVOT Area:     6.16 cm  LV Volumes (MOD) LV vol d, MOD A2C: 71.6 ml LV vol d, MOD A4C: 105.0 ml LV vol s, MOD A2C: 23.3 ml LV vol s, MOD A4C: 28.6 ml LV SV MOD A2C:     48.3 ml LV SV MOD A4C:     105.0 ml LV SV MOD BP:      62.7 ml RIGHT VENTRICLE RV S prime:     15.20 cm/s  PULMONARY VEINS TAPSE (M-mode): 2.3 cm      A Reversal Duration: 123.00 msec                             A Reversal Velocity: 20.00 cm/s                             Diastolic Velocity:  96.28 cm/s                             S/D Velocity:        0.70                             Systolic Velocity:   36.62 cm/s LEFT ATRIUM             Index       RIGHT ATRIUM  Index LA diam:        4.00 cm 1.90 cm/m  RA Area:     15.90 cm LA Vol (A2C):   61.6 ml 29.33 ml/m RA Volume:   43.60 ml  20.76 ml/m LA Vol (A4C):   44.4 ml 21.14 ml/m LA Biplane Vol: 52.6 ml 25.05 ml/m  AORTIC VALVE                   PULMONIC VALVE AV Area (Vmax):    4.54 cm    PV Vmax:       0.78 m/s AV Area (Vmean):   5.08 cm    PV Vmean:      56.800 cm/s AV Area (VTI):     4.59 cm    PV VTI:        0.199 m AV Vmax:           109.00 cm/s PV Peak grad:  2.4 mmHg AV Vmean:          69.400 cm/s PV Mean grad:  1.5 mmHg AV VTI:            0.260 m AV Peak Grad:      4.8 mmHg AV Mean Grad:      2.0 mmHg LVOT Vmax:         80.40 cm/s LVOT Vmean:        57.200 cm/s LVOT VTI:          0.194 m LVOT/AV VTI ratio: 0.75  AORTA Ao Root diam: 3.80 cm Ao Asc diam:  3.80 cm MITRAL VALVE               TRICUSPID VALVE MV Area (PHT): 2.50 cm    TR Peak grad:   28.3 mmHg MV Decel Time: 303 msec    TR Vmax:        266.00 cm/s MR Peak grad: 82.1 mmHg MR Vmax:      453.00 cm/s  SHUNTS MV E  velocity: 75.05 cm/s  Systemic VTI:  0.19 m MV A velocity: 46.70 cm/s  Systemic Diam: 2.80 cm MV E/A ratio:  1.61 Jenkins Rouge MD Electronically signed by Jenkins Rouge MD Signature Date/Time: 07/09/2020/11:22:49 AM    Final     Microbiology: Recent Results (from the past 240 hour(s))  Resp Panel by RT-PCR (Flu A&B, Covid) Nasopharyngeal Swab     Status: None   Collection Time: 07/07/20  2:03 PM   Specimen: Nasopharyngeal Swab; Nasopharyngeal(NP) swabs in vial transport medium  Result Value Ref Range Status   SARS Coronavirus 2 by RT PCR NEGATIVE NEGATIVE Final    Comment: (NOTE) SARS-CoV-2 target nucleic acids are NOT DETECTED.  The SARS-CoV-2 RNA is generally detectable in upper respiratory specimens during the acute phase of infection. The lowest concentration of SARS-CoV-2 viral copies this assay can detect is 138 copies/mL. A negative result does not preclude SARS-Cov-2 infection and should not be used as the sole basis for treatment or other patient management decisions. A negative result may occur with  improper specimen collection/handling, submission of specimen other than nasopharyngeal swab, presence of viral mutation(s) within the areas targeted by this assay, and inadequate number of viral copies(<138 copies/mL). A negative result must be combined with clinical observations, patient history, and epidemiological information. The expected result is Negative.  Fact Sheet for Patients:  EntrepreneurPulse.com.au  Fact Sheet for Healthcare Providers:  IncredibleEmployment.be  This test is no t yet approved or cleared by the [place] FDA and  has  been authorized for detection and/or diagnosis of SARS-CoV-2 by FDA under an Emergency Use Authorization (EUA). This EUA will remain  in effect (meaning this test can be used) for the duration of the COVID-19 declaration under Section 564(b)(1) of the Act, 21 U.S.C.section 360bbb-3(b)(1), unless  the authorization is terminated  or revoked sooner.       Influenza A by PCR NEGATIVE NEGATIVE Final   Influenza B by PCR NEGATIVE NEGATIVE Final    Comment: (NOTE) The Xpert Xpress SARS-CoV-2/FLU/RSV plus assay is intended as an aid in the diagnosis of influenza from Nasopharyngeal swab specimens and should not be used as a sole basis for treatment. Nasal washings and aspirates are unacceptable for Xpert Xpress SARS-CoV-2/FLU/RSV testing.  Fact Sheet for Patients: EntrepreneurPulse.com.au  Fact Sheet for Healthcare Providers: IncredibleEmployment.be  This test is not yet approved or cleared by the [place] FDA and has been authorized for detection and/or diagnosis of SARS-CoV-2 by FDA under an Emergency Use Authorization (EUA). This EUA will remain in effect (meaning this test can be used) for the duration of the COVID-19 declaration under Section 564(b)(1) of the Act, 21 U.S.C. section 360bbb-3(b)(1), unless the authorization is terminated or revoked.  Performed at [facility], [place] [address], [place], [place] [address]      Labs: Basic Metabolic Panel: Recent Labs  Lab 07/07/20 1206 07/08/20 0344 07/09/20 1008  NA 136 138 141  K 4.3 3.7 3.7  CL 98 107 110  CO2 26 26 28   GLUCOSE 154* 199* 167*  BUN 37* 25* 9  CREATININE 3.05* 1.64* 1.19  CALCIUM 9.2 8.2* 8.3*  MG  --  1.8 1.7  PHOS  --  2.0*  --    Liver Function Tests: Recent Labs  Lab 07/07/20 1206  AST 32  ALT 29  ALKPHOS 63  BILITOT 1.1  PROT 7.0  ALBUMIN 3.6   No results for input(s): LIPASE, AMYLASE in the last 168 hours. No results for input(s): AMMONIA in the last 168 hours. CBC: Recent Labs  Lab 07/07/20 1206 07/08/20 0344  WBC 6.7 5.7  NEUTROABS 5.2  --   HGB 15.1 12.9*  HCT 45.9 38.0*  MCV 92.9 91.6  PLT 203 170   Cardiac Enzymes: Recent Labs  Lab 07/07/20 1601  CKTOTAL 92   BNP: BNP (last 3 results) No results for input(s):  BNP in the last 8760 hours.  ProBNP (last 3 results) No results for input(s): PROBNP in the last 8760 hours.  CBG: Recent Labs  Lab 07/07/20 1230  GLUCAP 154*       Signed:  Kayleen Memos, MD [facility] 07/09/2020, 3:24 PM

## 2020-07-09 NOTE — Plan of Care (Signed)

## 2020-07-09 NOTE — Discharge Instructions (Signed)
Finding Treatment for Addiction Addiction is a complex disease of the brain that causes an uncontrollable (compulsive) need for:  A substance. This includes alcohol, illegal drugs, or prescription medicines, such as painkillers.  An activity or behavior, such as gambling or shopping. Addiction changes the way your brain works. Because of this change:  The need for the medicine, drug, or activity can become so strong that you think about it all the time.  Getting more and more of your addiction becomes the most important thing to you.  You may find yourself leaving other activities and relationships to pursue your addiction.  You can become physically dependent on a substance.  Your health, behavior, emotions, and relationships can change for the worse. How do I know if I need treatment for addiction? Addiction is a progressive disease. Without treatment, addiction can get worse. Living with addiction puts you at higher risk for injury, poor health, loss of employment, loss of money, and even death. You might need treatment for addiction if:  You have tried to stop or cut down, but you have not succeeded.  You find it annoying that your friends and family are concerned about your use or behavior.  You feel guilty about your use or behavior.  You need a particular substance or activity to start your day or to calm down.  You are running out of money because of your addiction.  You have done something illegal to support your addiction.  Your addiction has caused you: ? Health problems. ? Trouble in school, work, home, or with the police. ? To devote all your time to your addiction, and not to other responsibilities. ? To tell lies in order to hide your problem. What types of treatment are available? There may be options for treatment programs and plans based on your addiction, condition, needs, and preferences. No single treatment is right for everyone.  Treatment programs can  be: ? Outpatient. You live at home and go to work or school, but you go to a clinic for treatment. ? Inpatient. You live and sleep at the program facility during treatment.  Programs may include: ? Medicine. You may need medicine to treat the addiction itself, or to treat anxiety or depression. ? Counseling and behavior therapy. This can help individuals and families behave in healthier ways and relate more effectively. ? Support groups. Confidential group therapy, such as a 12-step program, can help individuals and families during treatment and recovery. ? A combination of education, counseling, and a 12-step, spirituality-based approach.   What should I consider when selecting a treatment program? Think about your individual requirements when selecting a treatment program. Ask about:  The overall approach to treatment. ? Some programs are strictly 12-step programs. Some have a more flexible approach. ? Programs may differ in length of stay, setting, and size. ? Some programs include your family in your treatment plan. Support may be offered to them throughout the treatment process, as well as instructions for them when you are discharged. ? You may continue to receive support after you have left the program.  The types of medical services that are offered. Find out if the program: ? Offers specific treatment for your particular addiction. ? Meets all of your needs, including physical and cultural needs. ? Includes any medicines you might need. ? Offers mental health counseling as part of your treatment. ? Offers the 12-step meetings at the center, or if transport is available for patients to attend meetings at other locations.  The cost and types of insurance that are accepted. ? Some programs are sponsored by the government. They support patients who do not have private insurance. ? If you do not have insurance, or if you choose to attend a program that does not accept your insurance,  call the treatment center. Tell them your financial needs and whether a payment plan can be set up. ? There are also organizations that will help you find the resources for treatment. You can find them online by searching "treatment for addiction."  If the program is certified by the appropriate government agency. Where to find support  Your health care provider can help you to find the right treatment. These discussions are confidential.  The [employer] on Alcoholism and Drug Dependence (NCADD). This group has information about treatment centers and programs for people who have an addiction and for family members. ? Call: 1-800-NCA-CALL (986-154-7138). ? Visit the website: https://www.ncadd.org/  The Substance Abuse and Mental Health Services Administration [facility]). This organization will help you find publicly funded treatment centers, help hotlines, and counseling services near you. ? Call: 1-800-662-HELP (279)128-8387). ? Visit the website: www.findtreatment.SamedayNews.com.cy  The National Problem Gambling Helpline. This is a 24-hour confidential helpline for gambling addiction. ? Call: 870-619-3274 ? Visit the website: http://wiggins.com/ In countries outside of the [place] and [place], look in [employer] for contact information for services in your area. Follow these instructions at home:  Find supportive people who will help you stay away from your addiction and stay sober.  Do not use the substance or engage in the activity.  If you have been through treatment: ? Follow your plan. The plan is usually developed by you and your health care provider during treatment. ? Go to meetings with other people in recovery. ? Avoid people, situations, and things that lead you to do the things you are addicted to (triggers). Summary  Addiction changes the way your brain works. These changes cause a desire to repeat and increase the use of the a substance or  behavior.  Addiction is a progressive disease. Without treatment, addiction can get worse. Living with addiction puts you at higher risk for injury, poor health, loss of employment, loss of money, and even death.  There may be options for treatment programs and plans based on your addiction, condition, needs, and preferences. No single treatment is right for everyone.  Your health care provider can help you to find the right treatment. These discussions are confidential. This information is not intended to replace advice given to you by your health care provider. Make sure you discuss any questions you have with your health care provider. Document Revised: 12/10/2018 Document Reviewed: 04/17/2017 Elsevier Patient Education  2021 [employer]. Syncope  Syncope refers to a condition in which a person temporarily loses consciousness. Syncope may also be called fainting or passing out. It is caused by a sudden decrease in blood flow to the brain. Even though most causes of syncope are not dangerous, syncope can be a sign of a serious medical problem. Your health care provider may do tests to find the reason why you are having syncope. Signs that you may be about to faint include:  Feeling dizzy or light-headed.  Feeling nauseous.  Seeing all white or all black in your field of vision.  Having cold, clammy skin. If you faint, get medical help right away. Call your local emergency services (911 in the [place] Do not drive yourself to the hospital. Follow these instructions at  home: Pay attention to any changes in your symptoms. Take these actions to stay safe and to help relieve your symptoms: Lifestyle  Do not drive, use machinery, or play sports until your health care provider says it is okay.  Do not drink alcohol.  Do not use any products that contain nicotine or tobacco, such as cigarettes and e-cigarettes. If you need help quitting, ask your health care provider.  Drink enough fluid  to keep your urine pale yellow. General instructions  Take over-the-counter and prescription medicines only as told by your health care provider.  If you are taking blood pressure or heart medicine, get up slowly and take several minutes to sit and then stand. This can reduce dizziness or light-headedness.  Have someone stay with you until you feel stable.  If you start to feel like you might faint, lie down right away and raise (elevate) your feet above the level of your heart. Breathe deeply and steadily. Wait until all the symptoms have passed.  Keep all follow-up visits as told by your health care provider. This is important. Get help right away if you:  Have a severe headache.  Faint once or repeatedly.  Have pain in your chest, abdomen, or back.  Have a very fast or irregular heartbeat (palpitations).  Have pain when you breathe.  Are bleeding from your mouth or rectum, or you have black or tarry stool.  Have a seizure.  Are confused.  Have trouble walking.  Have severe weakness.  Have vision problems. These symptoms may represent a serious problem that is an emergency. Do not wait to see if your symptoms will go away. Get medical help right away. Call your local emergency services (911 in the [place] Do not drive yourself to the hospital. Summary  Syncope refers to a condition in which a person temporarily loses consciousness. It is caused by a sudden decrease in blood flow to the brain.  Signs that you may be about to faint include dizziness, feeling light-headed, feeling nauseous, sudden vision changes, or cold, clammy skin.  Although most causes of syncope are not dangerous, syncope can be a sign of a serious medical problem. If you faint, get medical help right away. This information is not intended to replace advice given to you by your health care provider. Make sure you discuss any questions you have with your health care provider. Document Revised: 07/30/2019  Document Reviewed: 07/30/2019 Elsevier Patient Education  2021 [place].   Syncope Syncope is when you pass out (faint) for a short time. It is caused by a sudden decrease in blood flow to the brain. Signs that you may be about to pass out include:  Feeling dizzy or light-headed.  Feeling sick to your stomach (nauseous).  Seeing all white or all black.  Having cold, clammy skin. If you pass out, get help right away. Call your local emergency services (911 in the [place] Do not drive yourself to the hospital. Follow these instructions at home: Watch for any changes in your symptoms. Take these actions to stay safe and help with your symptoms: Lifestyle  Do not drive, use machinery, or play sports until your doctor says it is okay.  Do not drink alcohol.  Do not use any products that contain nicotine or tobacco, such as cigarettes and e-cigarettes. If you need help quitting, ask your doctor.  Drink enough fluid to keep your pee (urine) pale yellow. General instructions  Take over-the-counter and prescription medicines only as told by  your doctor.  If you are taking blood pressure or heart medicine, sit up and stand up slowly. Spend a few minutes getting ready to sit and then stand. This can help you feel less dizzy.  Have someone stay with you until you feel stable.  If you start to feel like you might pass out, lie down right away and raise (elevate) your feet above the level of your heart. Breathe deeply and steadily. Wait until all of the symptoms are gone.  Keep all follow-up visits as told by your doctor. This is important. Get help right away if:  You have a very bad headache.  You pass out once or more than once.  You have pain in your chest, belly, or back.  You have a very fast or uneven heartbeat (palpitations).  It hurts to breathe.  You are bleeding from your mouth or your bottom (rectum).  You have black or tarry poop (stool).  You have jerky  movements that you cannot control (seizure).  You are confused.  You have trouble walking.  You are very weak.  You have vision problems. These symptoms may be an emergency. Do not wait to see if the symptoms will go away. Get medical help right away. Call your local emergency services (911 in the [place] Do not drive yourself to the hospital. Summary  Syncope is when you pass out (faint) for a short time. It is caused by a sudden decrease in blood flow to the brain.  Signs that you may be about to faint include feeling dizzy, light-headed, or sick to your stomach, seeing all white or all black, or having cold, clammy skin.  If you start to feel like you might pass out, lie down right away and raise (elevate) your feet above the level of your heart. Breathe deeply and steadily. Wait until all of the symptoms are gone. This information is not intended to replace advice given to you by your health care provider. Make sure you discuss any questions you have with your health care provider. Document Revised: 04/29/2019 Document Reviewed: 05/01/2017 Elsevier Patient Education  2021 [employer].

## 2020-07-12 ENCOUNTER — Telehealth: Payer: Self-pay | Admitting: Internal Medicine

## 2020-07-12 NOTE — Telephone Encounter (Signed)
Patients ex- wife called and said that the patient is needing a hospital f/u. He was last seen in 2018. She was wondering if he could re establish care. Please advise

## 2020-07-13 NOTE — Telephone Encounter (Signed)
I am accept him back  - he will need to establish with someone else.

## 2020-07-15 MED ORDER — ATORVASTATIN CALCIUM 80 MG PO TABS: 80.0000 mg | ORAL_TABLET | Freq: Every day | ORAL | 2 refills | Status: DC

## 2020-08-05 ENCOUNTER — Other Ambulatory Visit: Payer: Self-pay

## 2020-08-08 ENCOUNTER — Ambulatory Visit: Payer: Medicare HMO | Admitting: Internal Medicine

## 2020-08-16 ENCOUNTER — Other Ambulatory Visit: Payer: Self-pay

## 2020-08-16 ENCOUNTER — Ambulatory Visit (INDEPENDENT_AMBULATORY_CARE_PROVIDER_SITE_OTHER): Payer: Medicare HMO | Admitting: Pharmacist

## 2020-08-16 VITALS — BP 164/88 | HR 65 | Resp 15 | Ht 76.0 in | Wt 175.4 lb

## 2020-08-16 DIAGNOSIS — I10 Essential (primary) hypertension: Secondary | ICD-10-CM | POA: Diagnosis not present

## 2020-08-16 MED ORDER — AMLODIPINE BESYLATE 5 MG PO TABS: 5.0000 mg | ORAL_TABLET | Freq: Every day | ORAL | 1 refills | Status: DC

## 2020-08-16 MED ORDER — LISINOPRIL 20 MG PO TABS: 20.0000 mg | ORAL_TABLET | Freq: Every day | ORAL | 1 refills | Status: DC

## 2020-08-16 MED ORDER — GUAIFENESIN ER 600 MG PO TB12: 600.0000 mg | ORAL_TABLET | Freq: Two times a day (BID) | ORAL | 0 refills | Status: DC

## 2020-08-16 NOTE — Assessment & Plan Note (Signed)
Blood pressure above goal. Patient is not on any BP medication at the moment. Noted recent UD was positive for caine; therefore, I will avoid beta-blockers for now.  AKI resolves with Scr back to 1.19.  Will resume amlodipine 5mg  daily, and lisinopril at 20mg  daily (instead of 40mg ). Will schedule follow up with APP/Dr Oval Linsey in 4-6 weeks to continue monitoring. Plan to repeat BMET during next OV and titrate lisinopril back to 40mg  daily if needed.

## 2020-08-16 NOTE — Patient Instructions (Addendum)
Return for a  follow up appointment in 4-5 weeks with Dr Oval Linsey  Check your blood pressure at home daily (if able) and keep record of the readings.  Take your BP meds as follows: *AMLODIPINE 5MG  DAILY* *LISINOPRIL 20MG  DAILY*  *CONTINUE CURRENT MEDICATION WITHOUT CHANGES*  Bring all of your meds, your BP cuff and your record of home blood pressures to your next appointment.  Exercise as you're able, try to walk approximately 30 minutes per day.  Keep salt intake to a minimum, especially watch canned and prepared boxed foods.  Eat more fresh fruits and vegetables and fewer canned items.  Avoid eating in fast food restaurants.    HOW TO TAKE YOUR BLOOD PRESSURE: . Rest 5 minutes before taking your blood pressure. .  Don't smoke or drink caffeinated beverages for at least 30 minutes before. . Take your blood pressure before (not after) you eat. . Sit comfortably with your back supported and both feet on the floor (don't cross your legs). . Elevate your arm to heart level on a table or a desk. . Use the proper sized cuff. It should fit smoothly and snugly around your bare upper arm. There should be enough room to slip a fingertip under the cuff. The bottom edge of the cuff should be 1 inch above the crease of the elbow. . Ideally, take 3 measurements at one sitting and record the average.

## 2020-08-16 NOTE — Progress Notes (Signed)
Patient ID: Christian Black                 DOB: 1946-03-27                      MRN: [id number]     HPI: Christian Black is a 75 y.o. male referred by Dr. Oval Black to HTN clinic.  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 He was recently admitted to the hospital for syncope and AKI. Noted UDS was positive for THC and cocaine on admission to the hospital. All BP medication were discontinued on discharged and instructed to follow up with PCP in 1-2 weeks. He doesn't have a PCP at this time and appointment with clinical pharmacist was scheduled.  He presents to clinic accompany by his fiancee. Is not currently on any antihypertensive medication. Reports he is drinking "A LOT "of water , but unable to estimate how much. Noted he is also eating take out more frequently, and can not recall what medication he is currently taking.  Current HTN meds: none  Previously tried:  Amlodipine 5mg  daily Carvedilol 12.5mg  twice daily Chlorthalidone 25 mg (1/4 tablet) daily Lisinopril 40mg  daily metoprolol 25mg  twice daily  BP goal: 130/80  Family History: Heart disease in one cousin and paternal uncle; both parents died from cancers  Social History: forme smoker, current cocaine, and mariuana use  Diet: eating take out lately, will cut back  Exercise: activities of daily living  Home BP readings: none provided  Wt Readings from Last 3 Encounters:  08/16/20 175 lb 6.4 oz (79.6 kg)  07/09/20 176 lb 6.4 oz (80 kg)  03/22/20 187 lb (84.8 kg)   BP Readings from Last 3 Encounters:  08/16/20 (!) 164/88  07/09/20 (!) 115/56  03/22/20 130/60   Pulse Readings from Last 3 Encounters:  08/16/20 65  07/09/20 (!) 53  03/22/20 (!) 54    Renal function: CrCl cannot be calculated (Patient's most recent lab result is older than the maximum 21 days allowed.).  Past  Medical History:  Diagnosis Date  . Colon cancer ([place])    colon mass  . Essential hypertension 05/23/2016  . Medical history non-contributory   . Pure hypercholesterolemia 03/22/2020  . Tobacco abuse 05/23/2016    Current Outpatient Medications on File Prior to Visit  Medication Sig Dispense Refill  . aspirin EC 81 MG tablet Take 81 mg by mouth daily.    Marland Kitchen atorvastatin (LIPITOR) 80 MG tablet Take 1 tablet (80 mg total) by mouth daily. 90 tablet 2  . folic acid (FOLVITE) 1 MG tablet Take 1 tablet (1 mg total) by mouth daily. 90 tablet 0  . Multiple Vitamin (MULTIVITAMIN WITH MINERALS) TABS tablet Take 1 tablet by mouth daily. 90 tablet 0  . thiamine 100 MG tablet Take 1 tablet (100 mg total) by mouth daily. 90 tablet 0   No current facility-administered medications on file prior to visit.    No Known Allergies  Blood pressure (!) 164/88, pulse 65, resp. rate 15, height 6\' 4"  (1.93 m), weight 175 lb 6.4 oz (79.6 kg), SpO2 99 %.  Essential hypertension Blood pressure above goal. Patient is not on any BP medication at the moment. Noted recent UD was positive for caine; therefore, I will avoid beta-blockers for now.  AKI resolves with Scr back to 1.19.  Will resume  amlodipine 5mg  daily, and lisinopril at 20mg  daily (instead of 40mg ). Will schedule follow up with APP/Dr Christian Black in 4-6 weeks to continue monitoring. Plan to repeat BMET during next OV and titrate lisinopril back to 40mg  daily if needed.   Christian Black PharmD, BCPS, [facility] [address] [place] [address] 08/16/2020 4:55 PM
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
## 2020-09-13 ENCOUNTER — Ambulatory Visit (INDEPENDENT_AMBULATORY_CARE_PROVIDER_SITE_OTHER): Payer: Medicare HMO | Admitting: Cardiovascular Disease

## 2020-09-13 ENCOUNTER — Encounter: Payer: Self-pay | Admitting: Cardiovascular Disease

## 2020-09-13 ENCOUNTER — Other Ambulatory Visit: Payer: Self-pay

## 2020-09-13 DIAGNOSIS — I4891 Unspecified atrial fibrillation: Secondary | ICD-10-CM

## 2020-09-13 DIAGNOSIS — E7800 Pure hypercholesterolemia, unspecified: Secondary | ICD-10-CM | POA: Diagnosis not present

## 2020-09-13 DIAGNOSIS — I10 Essential (primary) hypertension: Secondary | ICD-10-CM

## 2020-09-13 DIAGNOSIS — I9789 Other postprocedural complications and disorders of the circulatory system, not elsewhere classified: Secondary | ICD-10-CM | POA: Diagnosis not present

## 2020-09-13 DIAGNOSIS — I2510 Atherosclerotic heart disease of native coronary artery without angina pectoris: Secondary | ICD-10-CM

## 2020-09-13 HISTORY — DX: Unspecified atrial fibrillation: I48.91

## 2020-09-13 NOTE — Patient Instructions (Addendum)
Medication Instructions:  Your physician recommends that you continue on your current medications as directed. Please refer to the Current Medication list given to you today.  *If you need a refill on your cardiac medications before your next appointment, please call your pharmacy*  Lab Work: NONE  Testing/Procedures: NONE  Follow-Up: At [facility], you and your health needs are our priority.  As part of our continuing mission to provide you with exceptional heart care, we have created designated Provider Care Teams.  These Care Teams include your primary Cardiologist (physician) and Advanced Practice Providers (APPs -  Physician Assistants and Nurse Practitioners) who all work together to provide you with the care you need, when you need it.  We recommend signing up for the patient portal called "MyChart".  Sign up information is provided on this After Visit Summary.  MyChart is used to connect with patients for Virtual Visits (Telemedicine).  Patients are able to view lab/test results, encounter notes, upcoming appointments, etc.  Non-urgent messages can be sent to your provider as well.   To learn more about what you can do with MyChart, go to [email or web address].    Your next appointment:   6 month(s)  The format for your next appointment:   In Person  Provider:   DR Poland NP AT [place]

## 2020-09-13 NOTE — Assessment & Plan Note (Signed)
Status post CABG.  He is doing well and exercises regularly with no angina.  Lipids are at goal.  Continue aspirin and atorvastatin.  He is not on a beta-blocker due to history of cocaine use and bradycardia.

## 2020-09-13 NOTE — Assessment & Plan Note (Signed)
Continue atorvastatin.  LDL was 52 on 03/2020.

## 2020-09-13 NOTE — Assessment & Plan Note (Signed)
No recurrence in surgery.  He is not on a beta-blocker due to bradycardia.

## 2020-09-13 NOTE — Assessment & Plan Note (Signed)
He was hospitalized for intravascular volume depletion and syncope.  His blood pressure medications have been scaled back and he is doing well.  Blood pressure readings are ranging 025E to 527P systolic.  Continue amlodipine and lisinopril.

## 2020-10-12 ENCOUNTER — Encounter: Payer: Self-pay | Admitting: Internal Medicine

## 2020-10-12 ENCOUNTER — Ambulatory Visit (INDEPENDENT_AMBULATORY_CARE_PROVIDER_SITE_OTHER): Payer: Medicare HMO | Admitting: Internal Medicine

## 2020-10-12 ENCOUNTER — Other Ambulatory Visit: Payer: Self-pay

## 2020-10-12 VITALS — BP 126/72 | HR 68 | Temp 98.0°F | Resp 16 | Ht 76.0 in | Wt 172.0 lb

## 2020-10-12 DIAGNOSIS — D539 Nutritional anemia, unspecified: Secondary | ICD-10-CM | POA: Insufficient documentation

## 2020-10-12 DIAGNOSIS — C182 Malignant neoplasm of ascending colon: Secondary | ICD-10-CM

## 2020-10-12 DIAGNOSIS — N1831 Chronic kidney disease, stage 3a: Secondary | ICD-10-CM

## 2020-10-12 DIAGNOSIS — G912 (Idiopathic) normal pressure hydrocephalus: Secondary | ICD-10-CM | POA: Diagnosis not present

## 2020-10-12 DIAGNOSIS — Z125 Encounter for screening for malignant neoplasm of prostate: Secondary | ICD-10-CM

## 2020-10-12 DIAGNOSIS — R7303 Prediabetes: Secondary | ICD-10-CM | POA: Insufficient documentation

## 2020-10-12 DIAGNOSIS — I10 Essential (primary) hypertension: Secondary | ICD-10-CM

## 2020-10-12 DIAGNOSIS — Z0001 Encounter for general adult medical examination with abnormal findings: Secondary | ICD-10-CM | POA: Diagnosis not present

## 2020-10-12 LAB — CBC WITH DIFFERENTIAL/PLATELET
Basophils Absolute: 0 10*3/uL (ref 0.0–0.1)
Basophils Relative: 0.5 % (ref 0.0–3.0)
Eosinophils Absolute: 0.1 10*3/uL (ref 0.0–0.7)
Eosinophils Relative: 2.5 % (ref 0.0–5.0)
HCT: 44.2 % (ref 39.0–52.0)
Hemoglobin: 15 g/dL (ref 13.0–17.0)
Lymphocytes Relative: 31.6 % (ref 12.0–46.0)
Lymphs Abs: 1.5 10*3/uL (ref 0.7–4.0)
MCHC: 33.9 g/dL (ref 30.0–36.0)
MCV: 92 fl (ref 78.0–100.0)
Monocytes Absolute: 0.4 10*3/uL (ref 0.1–1.0)
Monocytes Relative: 8.4 % (ref 3.0–12.0)
Neutro Abs: 2.7 10*3/uL (ref 1.4–7.7)
Neutrophils Relative %: 57 % (ref 43.0–77.0)
Platelets: 185 10*3/uL (ref 150.0–400.0)
RBC: 4.8 Mil/uL (ref 4.22–5.81)
RDW: 14.4 % (ref 11.5–15.5)
WBC: 4.8 10*3/uL (ref 4.0–10.5)

## 2020-10-12 LAB — IRON: Iron: 62 ug/dL (ref 42–165)

## 2020-10-12 LAB — BASIC METABOLIC PANEL
BUN: 16 mg/dL (ref 6–23)
CO2: 28 mEq/L (ref 19–32)
Calcium: 9 mg/dL (ref 8.4–10.5)
Chloride: 104 mEq/L (ref 96–112)
Creatinine, Ser: 1.45 mg/dL (ref 0.40–1.50)
GFR: 48.12 mL/min — ABNORMAL LOW (ref >60.00)
Glucose, Bld: 88 mg/dL (ref 70–99)
Potassium: 3.7 mEq/L (ref 3.5–5.1)
Sodium: 140 mEq/L (ref 135–145)

## 2020-10-12 LAB — FERRITIN: Ferritin: 23.3 ng/mL (ref 22.0–322.0)

## 2020-10-12 LAB — VITAMIN B12: Vitamin B-12: 235 pg/mL (ref 211–911)

## 2020-10-12 LAB — HEMOGLOBIN A1C: Hgb A1c MFr Bld: 6.1 % (ref 4.6–6.5)

## 2020-10-12 LAB — PSA: PSA: 0.97 ng/mL (ref 0.10–4.00)

## 2020-10-12 LAB — FOLATE: Folate: >24.4 ng/mL (ref >5.9)

## 2020-10-12 NOTE — Patient Instructions (Signed)
[employer] medicine (25th ed., pp. 1059-1068). [place], [place]: Elsevier.">  Anemia  Anemia is a condition in which there is not enough red blood cells or hemoglobin in the blood. Hemoglobin is a substance in red blood cells thatcarries oxygen. When you do not have enough red blood cells or hemoglobin (are anemic), your body cannot get enough oxygen and your organs may not work properly. Asa result, you may feel very tired or have other problems. What are the causes? Common causes of anemia include: Excessive bleeding. Anemia can be caused by excessive bleeding inside or outside the body, including bleeding from the intestines or from heavy menstrual periods in females. Poor nutrition. Long-lasting (chronic) kidney, thyroid, and liver disease. Bone marrow disorders, spleen problems, and blood disorders. Cancer and treatments for cancer. HIV (human immunodeficiency virus) and AIDS (acquired immunodeficiency syndrome). Infections, medicines, and autoimmune disorders that destroy red blood cells. What are the signs or symptoms? Symptoms of this condition include: Minor weakness. Dizziness. Headache, or difficulties concentrating and sleeping. Heartbeats that feel irregular or faster than normal (palpitations). Shortness of breath, especially with exercise. Pale skin, lips, and nails, or cold hands and feet. Indigestion and nausea. Symptoms may occur suddenly or develop slowly. If your anemia is mild, you maynot have symptoms. How is this diagnosed? This condition is diagnosed based on blood tests, your medical history, and a physical exam. In some cases, a test may be needed in which cells are removed from the soft tissue inside of a bone and looked at under a microscope (bone marrow biopsy). Your health care provider may also check your stool (feces) for blood and may do additional testing to look for the cause of yourbleeding. Other tests may include: Imaging tests, such as a CT scan or  MRI. A procedure to see inside your esophagus and stomach (endoscopy). A procedure to see inside your colon and rectum (colonoscopy). How is this treated? Treatment for this condition depends on the cause. If you continue to lose a lot of blood, you may need to be treated at a hospital. Treatment may include: Taking supplements of iron, vitamin B12, or folic acid. Taking a hormone medicine (erythropoietin) that can help to stimulate red blood cell growth. Having a blood transfusion. This may be needed if you lose a lot of blood. Making changes to your diet. Having surgery to remove your spleen. Follow these instructions at home: Take over-the-counter and prescription medicines only as told by your health care provider. Take supplements only as told by your health care provider. Follow any diet instructions that you were given by your health care provider. Keep all follow-up visits as told by your health care provider. This is important. Contact a health care provider if: You develop new bleeding anywhere in the body. Get help right away if: You are very weak. You are short of breath. You have pain in your abdomen or chest. You are dizzy or feel faint. You have trouble concentrating. You have bloody stools, black stools, or tarry stools. You vomit repeatedly or you vomit up blood. These symptoms may represent a serious problem that is an emergency. Do not wait to see if the symptoms will go away. Get medical help right away. Call your local emergency services (911 in the [place] Do not drive yourself to the hospital. Summary Anemia is a condition in which you do not have enough red blood cells or enough of a substance in your red blood cells that carries oxygen (hemoglobin). Symptoms may occur suddenly   or develop slowly. If your anemia is mild, you may not have symptoms. This condition is diagnosed with blood tests, a medical history, and a physical exam. Other tests may be  needed. Treatment for this condition depends on the cause of the anemia. This information is not intended to replace advice given to you by your health care provider. Make sure you discuss any questions you have with your healthcare provider. Document Revised: 02/24/2019 Document Reviewed: 02/24/2019 Elsevier Patient Education  2022 [employer].

## 2020-10-12 NOTE — Progress Notes (Signed)
Subjective:  Patient ID: Christian Black, male    DOB: 1945/11/06  Age: 75 y.o. MRN: [id number]  CC: Anemia and Annual Exam  This visit occurred during the SARS-CoV-2 public health emergency.  Safety protocols were in place, including screening questions prior to the visit, additional usage of staff PPE, and extensive cleaning of exam room while observing appropriate contact time as indicated for disinfecting solutions.    HPI ABDIAZIZ KLAHN presents for a CPX, f/up, and to establish.  He was admitted about 3 months ago for a syncopal episode.  He was anemic at the time.  He is being followed by cardiology.  His MRI showed the possibility of NPH.  It is difficult to get much of a history from him.  He indicates that he has felt well recently and offers no complaints.  History Burnie has a past medical history of Colon cancer [facility]), Essential hypertension (05/23/2016), Medical history non-contributory, Postoperative atrial fibrillation ([place]) (09/13/2020), Pure hypercholesterolemia (03/22/2020), and Tobacco abuse (05/23/2016).   He has a past surgical history that includes Back surgery (1983); Colonoscopy with propofol (N/A, 05/25/2015); Colon resection (05/27/2015); Colonoscopy with propofol (N/A, 11/28/2015); LEFT HEART CATH AND CORONARY ANGIOGRAPHY (N/A, 05/30/2016); Coronary artery bypass graft (N/A, 06/01/2016); and TEE without cardioversion (N/A, 06/01/2016).   His family history includes Cancer in his cousin; Crohn's disease in his grandchild; Emphysema in his maternal aunt; Heart disease in his cousin and paternal uncle; Kidney failure in his maternal aunt; Lung cancer (age of onset: 52) in his maternal uncle; Ovarian cancer (age of onset: 41) in his mother; Pancreatic cancer (age of onset: 81) in his father.He reports that he quit smoking about 4 years ago. His smoking use included cigarettes. He has a 15.00 pack-year smoking history. He has never used smokeless tobacco. He reports current alcohol use  of about 7.0 standard drinks of alcohol per week. He reports that he does not use drugs.  Outpatient Medications Prior to Visit  Medication Sig Dispense Refill   amLODipine (NORVASC) 5 MG tablet Take 1 tablet (5 mg total) by mouth daily. 30 tablet 1   aspirin EC 81 MG tablet Take 81 mg by mouth daily.     atorvastatin (LIPITOR) 80 MG tablet Take 1 tablet (80 mg total) by mouth daily. 90 tablet 2   guaiFENesin (MUCINEX) 600 MG 12 hr tablet Take 1 tablet (600 mg total) by mouth 2 (two) times daily. 10 tablet 0   lisinopril (ZESTRIL) 20 MG tablet Take 1 tablet (20 mg total) by mouth daily. 30 tablet 1   No facility-administered medications prior to visit.    ROS Review of Systems  Constitutional:  Negative for diaphoresis and fatigue.  HENT: Negative.    Eyes: Negative.   Respiratory:  Negative for cough, chest tightness, shortness of breath and wheezing.   Cardiovascular:  Negative for chest pain, palpitations and leg swelling.  Gastrointestinal:  Negative for abdominal pain, constipation, diarrhea, nausea and vomiting.  Endocrine: Negative.   Genitourinary: Negative.  Negative for difficulty urinating, dysuria and hematuria.  Musculoskeletal: Negative.  Negative for arthralgias and myalgias.  Skin: Negative.   Neurological: Negative.  Negative for dizziness, weakness, light-headedness and headaches.  Hematological:  Negative for adenopathy. Does not bruise/bleed easily.  Psychiatric/Behavioral: Negative.  Negative for dysphoric mood and suicidal ideas. The patient is not nervous/anxious.    Objective:  BP 126/72 (BP Location: Right Arm, Patient Position: Sitting, Cuff Size: Large)   Pulse 68   Temp 98 F (36.7  C) (Oral)   Resp 16   Ht 6\' 4"  (1.93 m)   Wt 172 lb (78 kg)   SpO2 97%   BMI 20.94 kg/m   Physical Exam Vitals reviewed.  Constitutional:      Appearance: Normal appearance.  HENT:     Nose: Nose normal.     Mouth/Throat:     Mouth: Mucous membranes are moist.   Eyes:     General: No scleral icterus.    Conjunctiva/sclera: Conjunctivae normal.  Cardiovascular:     Rate and Rhythm: Normal rate and regular rhythm.     Heart sounds: No murmur heard. Pulmonary:     Effort: Pulmonary effort is normal.     Breath sounds: No stridor. No wheezing, rhonchi or rales.  Abdominal:     General: Abdomen is flat. Bowel sounds are normal. There is no distension.     Palpations: Abdomen is soft. There is no hepatomegaly, splenomegaly or mass.     Tenderness: There is no abdominal tenderness.     Hernia: There is no hernia in the left inguinal area or right inguinal area.  Genitourinary:    Pubic Area: No rash.      Penis: Normal.      Testes: Normal.     Epididymis:     Right: Normal.     Left: Normal.     Prostate: Normal. Not enlarged, not tender and no nodules present.     Rectum: Normal. Guaiac result negative. No mass, tenderness, anal fissure, external hemorrhoid or internal hemorrhoid. Normal anal tone.  Musculoskeletal:     Cervical back: Neck supple.  Lymphadenopathy:     Cervical: No cervical adenopathy.     Lower Body: No right inguinal adenopathy. No left inguinal adenopathy.  Neurological:     Mental Status: He is alert.    Lab Results  Component Value Date   WBC 4.8 10/12/2020   HGB 15.0 10/12/2020   HCT 44.2 10/12/2020   PLT 185.0 10/12/2020   GLUCOSE 88 10/12/2020   CHOL 152 03/28/2020   TRIG 50 03/28/2020   HDL 89 03/28/2020   LDLCALC 52 03/28/2020   ALT 29 07/07/2020   AST 32 07/07/2020   NA 140 10/12/2020   K 3.7 10/12/2020   CL 104 10/12/2020   CREATININE 1.45 10/12/2020   BUN 16 10/12/2020   CO2 28 10/12/2020   TSH 1.052 07/07/2020   PSA 0.97 10/12/2020   INR 1.42 06/01/2016   HGBA1C 6.1 10/12/2020    EXAM: MRI HEAD WITHOUT CONTRAST   TECHNIQUE: Multiplanar, multiecho pulse sequences of the brain and surrounding structures were obtained without intravenous contrast.   COMPARISON:  CT head July 07, 2020.    FINDINGS: Brain: No acute infarct. 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 Moderate additional scattered T2/FLAIR hyperintensities, most likely related to chronic microvascular ischemic disease. Mild generalized cerebral atrophy. Redemonstrated mild ventriculomegaly. No extra-axial fluid collections. No mass lesion or abnormal mass effect. No acute hemorrhage.   Vascular: Major arterial flow voids are maintained at the skull base.   Skull and upper cervical spine: Normal marrow signal.   Sinuses/Orbits: Mild mucosal thickening of ethmoid and maxillary sinuses with left maxillary sinus retention cyst. Unremarkable orbits.   Other: Small left mastoid effusion.   IMPRESSION: 1. No acute infarct. 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 2. Similar mild ventriculomegaly, which could be related to central predominant volume loss or normal pressure hydrocephalus in the correct clinical setting.  3. Moderate chronic microvascular ischemic disease.     Electronically Signed   By: Margaretha Sheffield MD   On: 07/08/2020 07:56     Assessment & Plan:   Alease Medina was seen today for anemia and annual exam.  Diagnoses and all orders for this visit:  Deficiency anemia- His H&H are normal now.  I will screen him for vitamin deficiencies. -     CBC with Differential/Platelet; Future -     Iron; Future -     Vitamin B12; Future -     Vitamin B1; Future -     Zinc; Future -     Folate; Future -     Ferritin; Future -     Reticulocytes; Future -     Reticulocytes -     Ferritin -     Folate -     Zinc -     Vitamin B1 -     Vitamin B12 -     Iron -     CBC with Differential/Platelet  Prediabetes- His A1c is at 6.1%.  Medical therapy is not indicated. -     Basic metabolic panel; Future -     Hemoglobin A1c; Future -     C-peptide; Future -     C-peptide -     Hemoglobin A1c -     Basic metabolic panel  Colon cancer, ascending ([place])- Will screen  for recurrence with a CEA. -     CEA; Future -     CEA  Screening for prostate cancer- His PSA is low.  This is a reassuring sign that he does not have prostate cancer. 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  I am having [employer]. Pilley maintain his aspirin EC, atorvastatin, amLODipine, lisinopril, and guaiFENesin.  No orders of the defined types were placed in this encounter.    Follow-up: Return in about 3 months (around 01/12/2021).  Scarlette Calico, MD

## 2020-10-15 DIAGNOSIS — Z0001 Encounter for general adult medical examination with abnormal findings: Secondary | ICD-10-CM | POA: Insufficient documentation

## 2020-10-15 DIAGNOSIS — G912 (Idiopathic) normal pressure hydrocephalus: Secondary | ICD-10-CM | POA: Insufficient documentation

## 2020-10-17 LAB — VITAMIN B1

## 2020-10-17 LAB — CEA: CEA: <0.5 ng/mL

## 2020-10-17 LAB — ZINC: Zinc: 77 ug/dL (ref 60–130)

## 2020-10-17 LAB — RETICULOCYTES
ABS Retic: 58320 cells/uL (ref 25000–90000)
Retic Ct Pct: 1.2 %

## 2020-10-17 LAB — C-PEPTIDE: C-Peptide: 0.7 ng/mL — ABNORMAL LOW (ref 0.80–3.85)

## 2020-10-20 ENCOUNTER — Other Ambulatory Visit: Payer: Self-pay | Admitting: Internal Medicine

## 2020-10-20 ENCOUNTER — Encounter (HOSPITAL_BASED_OUTPATIENT_CLINIC_OR_DEPARTMENT_OTHER): Payer: Self-pay

## 2020-10-20 ENCOUNTER — Telehealth: Payer: Self-pay | Admitting: Internal Medicine

## 2020-10-20 DIAGNOSIS — D528 Other folate deficiency anemias: Secondary | ICD-10-CM | POA: Insufficient documentation

## 2020-10-20 MED ORDER — FOLIC ACID 1 MG PO TABS: 1.0000 mg | ORAL_TABLET | Freq: Every day | ORAL | 1 refills | Status: DC

## 2020-10-20 NOTE — Telephone Encounter (Signed)
Pt needs a refill on folic acid (folvite) sent to [facility]

## 2020-10-24 DIAGNOSIS — N1831 Chronic kidney disease, stage 3a: Secondary | ICD-10-CM | POA: Insufficient documentation

## 2020-11-05 ENCOUNTER — Other Ambulatory Visit: Payer: Self-pay | Admitting: Cardiovascular Disease

## 2020-12-29 ENCOUNTER — Other Ambulatory Visit: Payer: Self-pay | Admitting: Cardiovascular Disease

## 2021-01-18 ENCOUNTER — Ambulatory Visit: Payer: Medicare HMO

## 2021-01-19 ENCOUNTER — Ambulatory Visit: Payer: Medicare HMO

## 2021-01-23 ENCOUNTER — Ambulatory Visit: Payer: Medicare HMO

## 2021-01-23 ENCOUNTER — Telehealth: Payer: Self-pay | Admitting: Internal Medicine

## 2021-01-23 NOTE — Telephone Encounter (Signed)
LM to call back in regards to today's appt, we need to reschedule due to the NHA being out today.

## 2021-01-27 ENCOUNTER — Ambulatory Visit (INDEPENDENT_AMBULATORY_CARE_PROVIDER_SITE_OTHER): Payer: Medicare HMO

## 2021-01-27 ENCOUNTER — Other Ambulatory Visit: Payer: Self-pay

## 2021-01-27 VITALS — BP 120/60 | HR 76 | Temp 98.2°F | Resp 16 | Ht 76.0 in | Wt 169.0 lb

## 2021-01-27 DIAGNOSIS — Z0000 Encounter for general adult medical examination without abnormal findings: Secondary | ICD-10-CM
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
## 2021-01-27 NOTE — Patient Instructions (Signed)
Christian Black , Thank you for taking time to come for your Medicare Wellness Visit. I appreciate your ongoing commitment to your health goals. Please review the following plan we discussed and let me know if I can assist you in the future.   Screening recommendations/referrals: Colonoscopy: 03/12/2019; due every 10 years Recommended yearly ophthalmology/optometry visit for glaucoma screening and checkup Recommended yearly dental visit for hygiene and checkup  Vaccinations: Influenza vaccine: 12/2020 Pneumococcal vaccine: 11/01/2015, 04/30/2018 Tdap vaccine: 04/30/2017; due every 10 years Shingles vaccine: never done   Covid-19: 04/24/2019, 05/15/2019  Advanced directives: Advance directive discussed with you today. Even though you declined this today please call our office should you change your mind and we can give you the proper paperwork for you to fill out.  Conditions/risks identified: Yes; Client understands the importance of follow-up with providers by attending scheduled visits and discussed goals to eat healthier, increase physical activity, exercise the brain, socialize more, get enough sleep and make time for laughter.  Next appointment: Please schedule your next Medicare Wellness Visit with your Nurse Health Advisor in 1 year by calling [phone].  Preventive Care 73 Years and Older, Male Preventive care refers to lifestyle choices and visits with your health care provider that can promote health and wellness. What does preventive care include? A yearly physical exam. This is also called an annual well check. Dental exams once or twice a year. Routine eye exams. Ask your health care provider how often you should have your eyes checked. Personal lifestyle choices, including: Daily care of your teeth and gums. Regular physical activity. Eating a healthy diet. Avoiding tobacco and drug use. Limiting alcohol use. Practicing safe sex. Taking low doses of aspirin every day. Taking  vitamin and mineral supplements as recommended by your health care provider. What happens during an annual well check? The services and screenings done by your health care provider during your annual well check will depend on your age, overall health, lifestyle risk factors, and family history of disease. Counseling  Your health care provider may ask you questions about your: Alcohol use. Tobacco use. Drug use. Emotional well-being. Home and relationship well-being. Sexual activity. Eating habits. History of falls. Memory and ability to understand (cognition). Work and work Statistician. Screening  You may have the following tests or measurements: Height, weight, and BMI. Blood pressure. Lipid and cholesterol levels. These may be checked every 5 years, or more frequently if you are over 5 years old. Skin check. Lung cancer screening. You may have this screening every year starting at age 79 if you have a 30-pack-year history of smoking and currently smoke or have quit within the past 15 years. Fecal occult blood test (FOBT) of the stool. You may have this test every year starting at age 54. Flexible sigmoidoscopy or colonoscopy. You may have a sigmoidoscopy every 5 years or a colonoscopy every 10 years starting at age 9. Prostate cancer screening. Recommendations will vary depending on your family history and other risks. Hepatitis C blood test. Hepatitis B blood test. Sexually transmitted disease (STD) testing. Diabetes screening. This is done by checking your blood sugar (glucose) after you have not eaten for a while (fasting). You may have this done every 1-3 years. Abdominal aortic aneurysm (AAA) screening. You may need this if you are a current or former smoker. Osteoporosis. You may be screened starting at age 51 if you are at high risk. Talk with your health care provider about your test results, treatment options, and if necessary,  the need for more tests. Vaccines  Your  health care provider may recommend certain vaccines, such as: Influenza vaccine. This is recommended every year. Tetanus, diphtheria, and acellular pertussis (Tdap, Td) vaccine. You may need a Td booster every 10 years. Zoster vaccine. You may need this after age 73. Pneumococcal 13-valent conjugate (PCV13) vaccine. One dose is recommended after age 8. Pneumococcal polysaccharide (PPSV23) vaccine. One dose is recommended after age 73. Talk to your health care provider about which screenings and vaccines you need and how often you need them. This information is not intended to replace advice given to you by your health care provider. Make sure you discuss any questions you have with your health care provider. Document Released: 04/15/2015 Document Revised: 12/07/2015 Document Reviewed: 01/18/2015 Elsevier Interactive Patient Education  2017 [place] Prevention in the Home Falls can cause injuries. They can happen to people of all ages. There are many things you can do to make your home safe and to help prevent falls. What can I do on the outside of my home? Regularly fix the edges of walkways and driveways and fix any cracks. Remove anything that might make you trip as you walk through a door, such as a raised step or threshold. Trim any bushes or trees on the path to your home. Use bright outdoor lighting. Clear any walking paths of anything that might make someone trip, such as rocks or tools. Regularly check to see if handrails are loose or broken. Make sure that both sides of any steps have handrails. Any raised decks and porches should have guardrails on the edges. Have any leaves, snow, or ice cleared regularly. Use sand or salt on walking paths during winter. Clean up any spills in your garage right away. This includes oil or grease spills. What can I do in the bathroom? Use night lights. Install grab bars by the toilet and in the tub and shower. Do not use towel bars as  grab bars. Use non-skid mats or decals in the tub or shower. If you need to sit down in the shower, use a plastic, non-slip stool. Keep the floor dry. Clean up any water that spills on the floor as soon as it happens. Remove soap buildup in the tub or shower regularly. Attach bath mats securely with double-sided non-slip rug tape. Do not have throw rugs and other things on the floor that can make you trip. What can I do in the bedroom? Use night lights. Make sure that you have a light by your bed that is easy to reach. Do not use any sheets or blankets that are too big for your bed. They should not hang down onto the floor. Have a firm chair that has side arms. You can use this for support while you get dressed. Do not have throw rugs and other things on the floor that can make you trip. What can I do in the kitchen? Clean up any spills right away. Avoid walking on wet floors. Keep items that you use a lot in easy-to-reach places. If you need to reach something above you, use a strong step stool that has a grab bar. Keep electrical cords out of the way. Do not use floor polish or wax that makes floors slippery. If you must use wax, use non-skid floor wax. Do not have throw rugs and other things on the floor that can make you trip. What can I do with my stairs? Do not leave any items on  the stairs. Make sure that there are handrails on both sides of the stairs and use them. Fix handrails that are broken or loose. Make sure that handrails are as long as the stairways. Check any carpeting to make sure that it is firmly attached to the stairs. Fix any carpet that is loose or worn. Avoid having throw rugs at the top or bottom of the stairs. If you do have throw rugs, attach them to the floor with carpet tape. Make sure that you have a light switch at the top of the stairs and the bottom of the stairs. If you do not have them, ask someone to add them for you. What else can I do to help prevent  falls? Wear shoes that: Do not have high heels. Have rubber bottoms. Are comfortable and fit you well. Are closed at the toe. Do not wear sandals. If you use a stepladder: Make sure that it is fully opened. Do not climb a closed stepladder. Make sure that both sides of the stepladder are locked into place. Ask someone to hold it for you, if possible. Clearly mark and make sure that you can see: Any grab bars or handrails. First and last steps. Where the edge of each step is. Use tools that help you move around (mobility aids) if they are needed. These include: Canes. Walkers. Scooters. Crutches. Turn on the lights when you go into a dark area. Replace any light bulbs as soon as they burn out. Set up your furniture so you have a clear path. Avoid moving your furniture around. If any of your floors are uneven, fix them. If there are any pets around you, be aware of where they are. Review your medicines with your doctor. Some medicines can make you feel dizzy. This can increase your chance of falling. Ask your doctor what other things that you can do to help prevent falls. This information is not intended to replace advice given to you by your health care provider. Make sure you discuss any questions you have with your health care provider. Document Released: 01/13/2009 Document Revised: 08/25/2015 Document Reviewed: 04/23/2014 Elsevier Interactive Patient Education  2017 [employer].

## 2021-04-07 ENCOUNTER — Other Ambulatory Visit: Payer: Self-pay | Admitting: Cardiovascular Disease

## 2021-04-10 ENCOUNTER — Telehealth (HOSPITAL_BASED_OUTPATIENT_CLINIC_OR_DEPARTMENT_OTHER): Payer: Self-pay | Admitting: Cardiovascular Disease

## 2021-04-10 MED ORDER — LISINOPRIL 20 MG PO TABS: 20.0000 mg | ORAL_TABLET | Freq: Every day | ORAL | 6 refills | Status: DC

## 2021-04-10 NOTE — Telephone Encounter (Signed)
Received fax from [facility] on [address] requesting refills for Lisinopril 20 mg. Rx request sent to pharmacy.

## 2021-04-10 NOTE — Telephone Encounter (Signed)
Rx request sent to pharmacy.  

## 2021-05-11 ENCOUNTER — Other Ambulatory Visit: Payer: Self-pay | Admitting: Cardiovascular Disease

## 2021-05-12 NOTE — Telephone Encounter (Signed)
Rx(s) sent to pharmacy electronically.  

## 2021-05-23 ENCOUNTER — Other Ambulatory Visit: Payer: Self-pay | Admitting: Cardiovascular Disease

## 2021-07-14 ENCOUNTER — Other Ambulatory Visit: Payer: Self-pay | Admitting: Internal Medicine

## 2021-07-14 DIAGNOSIS — D528 Other folate deficiency anemias: Secondary | ICD-10-CM
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
## 2021-07-24 ENCOUNTER — Ambulatory Visit (HOSPITAL_BASED_OUTPATIENT_CLINIC_OR_DEPARTMENT_OTHER): Payer: Medicare HMO | Admitting: Cardiovascular Disease
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
## 2021-08-08 ENCOUNTER — Ambulatory Visit (HOSPITAL_BASED_OUTPATIENT_CLINIC_OR_DEPARTMENT_OTHER): Payer: Medicare HMO | Admitting: Cardiovascular Disease

## 2021-08-23 ENCOUNTER — Ambulatory Visit (HOSPITAL_BASED_OUTPATIENT_CLINIC_OR_DEPARTMENT_OTHER): Payer: Medicare HMO | Admitting: Family
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
## 2021-08-25 ENCOUNTER — Ambulatory Visit (INDEPENDENT_AMBULATORY_CARE_PROVIDER_SITE_OTHER): Payer: Medicare HMO | Admitting: Family

## 2021-08-25 ENCOUNTER — Encounter (HOSPITAL_BASED_OUTPATIENT_CLINIC_OR_DEPARTMENT_OTHER): Payer: Self-pay | Admitting: Family

## 2021-08-25 VITALS — BP 108/60 | HR 53 | Ht 76.0 in | Wt 173.2 lb

## 2021-08-25 DIAGNOSIS — I10 Essential (primary) hypertension: Secondary | ICD-10-CM | POA: Diagnosis not present

## 2021-08-25 DIAGNOSIS — I2510 Atherosclerotic heart disease of native coronary artery without angina pectoris: Secondary | ICD-10-CM | POA: Diagnosis not present

## 2021-08-25 DIAGNOSIS — E785 Hyperlipidemia, unspecified: Secondary | ICD-10-CM | POA: Diagnosis not present

## 2021-08-25 DIAGNOSIS — Z951 Presence of aortocoronary bypass graft: Secondary | ICD-10-CM | POA: Diagnosis not present

## 2021-08-25 DIAGNOSIS — R001 Bradycardia, unspecified: Secondary | ICD-10-CM

## 2021-08-25 MED ORDER — CARVEDILOL 6.25 MG PO TABS: 6.2500 mg | ORAL_TABLET | Freq: Two times a day (BID) | ORAL | 3 refills | Status: DC

## 2021-08-25 NOTE — Patient Instructions (Signed)
Medication Instructions:  Your physician has recommended you make the following change in your medication:   REDUCE your Carvedilol (Coreg) to 6.'25mg'$  BID   *If you need a refill on your cardiac medications before your next appointment, please call your pharmacy*   Lab Work: None ordered today.   Testing/Procedures: Your EKG today shows sinus bradycardia which is a stable finding.   Follow-Up: At [facility], you and your health needs are our priority.  As part of our continuing mission to provide you with exceptional heart care, we have created designated Provider Care Teams.  These Care Teams include your primary Cardiologist (physician) and Advanced Practice Providers (APPs -  Physician Assistants and Nurse Practitioners) who all work together to provide you with the care you need, when you need it.  We recommend signing up for the patient portal called "MyChart".  Sign up information is provided on this After Visit Summary.  MyChart is used to connect with patients for Virtual Visits (Telemedicine).  Patients are able to view lab/test results, encounter notes, upcoming appointments, etc.  Non-urgent messages can be sent to your provider as well.   To learn more about what you can do with MyChart, go to [email or web address].    Your next appointment:   1 year(s)  The format for your next appointment:   In Person  Provider:   Skeet Latch, MD or Loel Dubonnet, NP     Other Instructions  Heart Healthy Diet Recommendations: A low-salt diet is recommended. Meats should be grilled, baked, or boiled. Avoid fried foods. Focus on lean protein sources like fish or chicken with vegetables and fruits. The American Heart Association is a Microbiologist!  American Heart Association Diet and Lifeystyle Recommendations   Exercise recommendations: The American Heart Association recommends 150 minutes of moderate intensity exercise weekly. Try 30 minutes of moderate intensity  exercise 4-5 times per week. This could include walking, jogging, or swimming.   Important Information About Sugar
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
## 2021-11-18 ENCOUNTER — Other Ambulatory Visit: Payer: Self-pay | Admitting: Cardiovascular Disease

## 2021-11-18 ENCOUNTER — Other Ambulatory Visit: Payer: Self-pay | Admitting: Internal Medicine

## 2021-11-18 DIAGNOSIS — D528 Other folate deficiency anemias: Secondary | ICD-10-CM

## 2021-11-20 NOTE — Telephone Encounter (Signed)
Rx(s) sent to pharmacy electronically.  

## 2021-11-21 ENCOUNTER — Other Ambulatory Visit: Payer: Self-pay | Admitting: Internal Medicine

## 2021-11-21 DIAGNOSIS — D528 Other folate deficiency anemias: Secondary | ICD-10-CM

## 2021-12-06 ENCOUNTER — Telehealth: Payer: Self-pay | Admitting: Internal Medicine

## 2021-12-06 NOTE — Telephone Encounter (Signed)
Patient needs his folic acid refilled - please send to [facility] on [address] in [place]

## 2021-12-16 ENCOUNTER — Other Ambulatory Visit (HOSPITAL_BASED_OUTPATIENT_CLINIC_OR_DEPARTMENT_OTHER): Payer: Self-pay | Admitting: Cardiovascular Disease

## 2021-12-18 NOTE — Telephone Encounter (Signed)
Rx request sent to pharmacy.  

## 2022-01-22 ENCOUNTER — Telehealth: Payer: Self-pay | Admitting: Internal Medicine

## 2022-01-22 NOTE — Telephone Encounter (Signed)
Left message for patient to call back to schedule Medicare Annual Wellness Visit   Last AWV  01/27/21  Please schedule at anytime with [facility]-Nurse Health Advisor if patient calls the office back.      Any questions, please call me at [phone] 

## 2022-01-23 ENCOUNTER — Telehealth: Payer: Self-pay | Admitting: Internal Medicine

## 2022-01-23 ENCOUNTER — Ambulatory Visit: Payer: Medicare HMO | Admitting: Internal Medicine

## 2022-01-23 DIAGNOSIS — D528 Other folate deficiency anemias: Secondary | ICD-10-CM

## 2022-01-23 NOTE — Telephone Encounter (Signed)
N/A unable to leave a message for patient to call back to schedule Medicare Annual Wellness Visit   Last AWV  01/27/22  Please schedule at anytime with [facility] if patient calls the office back.      Any questions, please call me at [phone]

## 2022-01-23 NOTE — Telephone Encounter (Signed)
PT visits today in regards to getting a refill on their folic acid. PT was late for their appointment today to initiate this RX refill. I was able to get PT rescheduled to next Tuesday but PT is still out of their medication. I had let them know that there may be potential for a partial.  If a partial could be placed they are requesting this be sent to the [facility] ([place]).  CB: [phone]

## 2022-01-24 ENCOUNTER — Other Ambulatory Visit: Payer: Self-pay | Admitting: Internal Medicine

## 2022-01-24 DIAGNOSIS — D528 Other folate deficiency anemias: Secondary | ICD-10-CM

## 2022-01-24 MED ORDER — FOLIC ACID 1 MG PO TABS: 1.0000 mg | ORAL_TABLET | Freq: Every day | ORAL | 0 refills | Status: AC

## 2022-01-24 MED ORDER — FOLIC ACID 1 MG PO TABS: 1.0000 mg | ORAL_TABLET | Freq: Every day | ORAL | 0 refills | Status: DC

## 2022-01-24 NOTE — Telephone Encounter (Signed)
Rx has been corrected to [facility] on [address].

## 2022-01-24 NOTE — Telephone Encounter (Signed)
Called PT this morning to notify of their RX. PT was needing this sent to the [facility] on [address] though, not the [facility] location.  I asked PT if they could still make it out to this location and they stated they could not. I had let them know I would route this back to Dr.Jones.

## 2022-01-30 ENCOUNTER — Ambulatory Visit (INDEPENDENT_AMBULATORY_CARE_PROVIDER_SITE_OTHER): Payer: Medicare HMO

## 2022-01-30 ENCOUNTER — Ambulatory Visit (INDEPENDENT_AMBULATORY_CARE_PROVIDER_SITE_OTHER): Payer: Medicare HMO | Admitting: Internal Medicine

## 2022-01-30 ENCOUNTER — Encounter: Payer: Self-pay | Admitting: Internal Medicine

## 2022-01-30 VITALS — BP 124/76 | HR 44 | Temp 98.5°F | Resp 16 | Ht 76.0 in | Wt 164.0 lb

## 2022-01-30 DIAGNOSIS — R001 Bradycardia, unspecified: Secondary | ICD-10-CM

## 2022-01-30 DIAGNOSIS — E7800 Pure hypercholesterolemia, unspecified: Secondary | ICD-10-CM

## 2022-01-30 DIAGNOSIS — N1831 Chronic kidney disease, stage 3a: Secondary | ICD-10-CM

## 2022-01-30 DIAGNOSIS — B351 Tinea unguium: Secondary | ICD-10-CM

## 2022-01-30 DIAGNOSIS — R053 Chronic cough: Secondary | ICD-10-CM

## 2022-01-30 DIAGNOSIS — Z125 Encounter for screening for malignant neoplasm of prostate: Secondary | ICD-10-CM

## 2022-01-30 DIAGNOSIS — Z720 Tobacco use: Secondary | ICD-10-CM

## 2022-01-30 DIAGNOSIS — R7303 Prediabetes: Secondary | ICD-10-CM | POA: Diagnosis not present

## 2022-01-30 DIAGNOSIS — I10 Essential (primary) hypertension: Secondary | ICD-10-CM | POA: Diagnosis not present

## 2022-01-30 DIAGNOSIS — J439 Emphysema, unspecified: Secondary | ICD-10-CM

## 2022-01-30 DIAGNOSIS — Z0001 Encounter for general adult medical examination with abnormal findings: Secondary | ICD-10-CM | POA: Diagnosis not present

## 2022-01-30 DIAGNOSIS — D528 Other folate deficiency anemias: Secondary | ICD-10-CM

## 2022-01-30 NOTE — Progress Notes (Unsigned)
Subjective:  Patient ID: Christian Black, male    DOB: Dec 09, 1945  Age: 76 y.o. MRN: [id number]  CC: Annual Exam, Hypertension, and Cough   HPI Christian Black presents for a CPX and f/up -   He complains of a chronic cough that is intermittently productive of yellow mucus.  He continues to smoke cigarettes.  He denies chest pain, shortness of breath, wheezing, diaphoresis, palpitations, or edema.  He also complains of weight loss and poor appetite.  Outpatient Medications Prior to Visit  Medication Sig Dispense Refill   amLODipine (NORVASC) 5 MG tablet Take 1 tablet by mouth once daily 90 tablet 1   aspirin EC 81 MG tablet Take 81 mg by mouth daily.     atorvastatin (LIPITOR) 80 MG tablet Take 1 tablet by mouth once daily 90 tablet 1   folic acid (FOLVITE) 1 MG tablet Take 1 tablet (1 mg total) by mouth daily. 90 tablet 0   carvedilol (COREG) 6.25 MG tablet Take 1 tablet (6.25 mg total) by mouth 2 (two) times daily with a meal. 180 tablet 3   lisinopril (ZESTRIL) 20 MG tablet Take 1 tablet by mouth once daily 90 tablet 1   No facility-administered medications prior to visit.    ROS Review of Systems  Constitutional:  Positive for appetite change and unexpected weight change. Negative for activity change, chills, diaphoresis, fatigue and fever.  HENT: Negative.    Eyes: Negative.   Respiratory:  Positive for cough. Negative for chest tightness, shortness of breath and wheezing.   Cardiovascular:  Negative for chest pain, palpitations and leg swelling.  Gastrointestinal:  Negative for abdominal pain, diarrhea, nausea and vomiting.  Genitourinary: Negative.  Negative for difficulty urinating.  Musculoskeletal: Negative.  Negative for arthralgias and myalgias.  Skin: Negative.   Neurological: Negative.   Hematological:  Negative for adenopathy. Does not bruise/bleed easily.  Psychiatric/Behavioral: Negative.      Objective:  BP 124/76 (BP Location: Right Arm, Patient Position:  Sitting, Cuff Size: Large)   Pulse (!) 44   Temp 98.5 F (36.9 C) (Oral)   Resp 16   Ht '6\' 4"'$  (1.93 m)   Wt 164 lb (74.4 kg)   SpO2 98%   BMI 19.96 kg/m   BP Readings from Last 3 Encounters:  01/30/22 124/76  08/25/21 108/60  01/27/21 120/60    Wt Readings from Last 3 Encounters:  01/30/22 164 lb (74.4 kg)  08/25/21 173 lb 3.2 oz (78.6 kg)  01/27/21 169 lb (76.7 kg)    Physical Exam Vitals reviewed.  Constitutional:      General: He is not in acute distress.    Appearance: He is underweight. He is ill-appearing. He is not toxic-appearing or diaphoretic.  HENT:     Nose: Nose normal.     Mouth/Throat:     Mouth: Mucous membranes are moist.  Eyes:     General: No scleral icterus.    Conjunctiva/sclera: Conjunctivae normal.  Cardiovascular:     Rate and Rhythm: Regular rhythm. Bradycardia present.     Pulses:          Carotid pulses are 1+ on the right side and 1+ on the left side.      Radial pulses are 1+ on the right side and 1+ on the left side.       Femoral pulses are 1+ on the right side and 1+ on the left side.      Popliteal pulses are 1+ on the right  side and 1+ on the left side.       Dorsalis pedis pulses are 1+ on the right side and 1+ on the left side.       Posterior tibial pulses are 1+ on the right side and 1+ on the left side.     Heart sounds: No murmur heard.    No friction rub. No gallop.     Comments: EKG- SB with 1st degree AV block, 47 bpm ?LAE Septal infarct pattern is old No ST/T wave changes Improved compared to EKG from 5 months ago Pulmonary:     Effort: Pulmonary effort is normal.     Breath sounds: No stridor. No wheezing, rhonchi or rales.  Abdominal:     General: Abdomen is flat.     Palpations: There is no mass.     Tenderness: There is no abdominal tenderness. There is no guarding or rebound.     Hernia: No hernia is present.  Musculoskeletal:        General: Normal range of motion.     Cervical back: Neck supple.      Right lower leg: No edema.     Left lower leg: No edema.  Skin:    General: Skin is warm and dry.  Neurological:     General: No focal deficit present.     Mental Status: He is alert.  Psychiatric:        Attention and Perception: Perception normal. He is inattentive.        Mood and Affect: Mood normal. Affect is flat.        Speech: He is communicative. Speech is delayed. Speech is not rapid and pressured, slurred or tangential.        Behavior: Behavior normal. Behavior is cooperative.        Thought Content: Thought content normal. Thought content is not delusional.        Cognition and Memory: Cognition is impaired. Memory is impaired.        Judgment: Judgment is not impulsive or inappropriate.     Lab Results  Component Value Date   WBC 4.6 02/05/2022   HGB 13.4 02/05/2022   HCT 40.1 02/05/2022   PLT 198.0 02/05/2022   GLUCOSE 88 10/12/2020   CHOL 147 02/05/2022   TRIG 44.0 02/05/2022   HDL 78.20 02/05/2022   LDLCALC 60 02/05/2022   ALT 15 02/05/2022   AST 19 02/05/2022   NA 140 10/12/2020   K 3.7 10/12/2020   CL 104 10/12/2020   CREATININE 1.45 10/12/2020   BUN 16 10/12/2020   CO2 28 10/12/2020   TSH 0.96 02/05/2022   PSA 0.69 02/05/2022   INR 1.42 06/01/2016   HGBA1C 6.2 02/05/2022    MR BRAIN WO CONTRAST  Result Date: 07/08/2020 CLINICAL DATA:  Concern for stroke.  Abnormal CT. EXAM: MRI HEAD WITHOUT CONTRAST TECHNIQUE: Multiplanar, multiecho pulse sequences of the brain and surrounding structures were obtained without intravenous contrast. COMPARISON:  CT head July 07, 2020. FINDINGS: Brain: No acute infarct. 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 Moderate additional scattered T2/FLAIR hyperintensities, most likely related to chronic microvascular ischemic disease. Mild generalized cerebral atrophy. Redemonstrated mild ventriculomegaly. No extra-axial fluid collections. No mass lesion or abnormal mass effect. No acute hemorrhage. Vascular: Major  arterial flow voids are maintained at the skull base. Skull and upper cervical spine: Normal marrow signal. Sinuses/Orbits: Mild mucosal thickening of ethmoid and maxillary sinuses with left maxillary sinus retention cyst. Unremarkable orbits.  Other: Small left mastoid effusion. IMPRESSION: 1. No acute infarct. 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 2. Similar mild ventriculomegaly, which could be related to central predominant volume loss or normal pressure hydrocephalus in the correct clinical setting. 3. Moderate chronic microvascular ischemic disease. Electronically Signed   By: Margaretha Sheffield MD   On: 07/08/2020 07:56   US RENAL  Result Date: 07/07/2020 CLINICAL DATA:  Acute renal failure. EXAM: RENAL / URINARY TRACT ULTRASOUND COMPLETE COMPARISON:  None. FINDINGS: Right Kidney: Renal measurements: 9.1 cm x 4.2 cm x 4.2 cm = volume: 84.82 mL. Echogenicity within normal limits. No mass or hydronephrosis visualized. Left Kidney: Renal measurements: 8.5 cm x 5.6 cm x 3.8 cm = volume: 85.82 mL. Echogenicity within normal limits. A 2.4 cm x 2.0 cm x 2.3 cm anechoic structure is seen within the lower pole of the left kidney. No abnormal flow is noted within this region on color Doppler evaluation. No hydronephrosis visualized. Bladder: Appears normal for degree of bladder distention. Other: None. IMPRESSION: Simple left renal cyst. Electronically Signed   By: Virgina Norfolk M.D.   On: 07/07/2020 20:45   CT Head Wo Contrast  Result Date: 07/07/2020 CLINICAL DATA:  Syncope. EXAM: CT HEAD WITHOUT CONTRAST TECHNIQUE: Contiguous axial images were obtained from the base of the skull through the vertex without intravenous contrast. COMPARISON:  None. FINDINGS: Brain: Hypodensity within the right thalamus, compatible with age indeterminate infarct. Mildly prominent ventricles with mild rounding of the temporal horns. No acute hemorrhage. No visible extra-axial fluid collections. No mass lesion or  abnormal mass effect. Moderate patchy white matter hypoattenuation, most likely related to chronic microvascular ischemic disease. Vascular: No hyperdense vessel identified. Calcific atherosclerosis. Skull: No acute fracture. Sinuses/Orbits: Retention cyst in the left maxillary sinus. Mild ethmoid air cell mucosal thickening. No air-fluid levels. Unremarkable orbits. Other: No mastoid effusions. IMPRESSION: 1. 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 Recommend MRI to further evaluate. 2. Mildly prominent ventricular system. This finding may relate to central predominant volume loss, but normal pressure hydrocephalus could have a similar appearance in the correct clinical setting. 3. Moderate chronic microvascular ischemic disease. Electronically Signed   By: Margaretha Sheffield MD   On: 07/07/2020 14:00   DG Chest 1 View  Result Date: 07/07/2020 CLINICAL DATA:  Witnessed syncopal episodes while sitting in a chair, altered level of consciousness, history hypertension, colon cancer, former smoker EXAM: CHEST  1 VIEW COMPARISON:  Portable exam 1217 hours compared to 07/05/2016 FINDINGS: Normal heart size post CABG. Mediastinal contours and pulmonary vascularity normal. Emphysematous and bronchitic changes consistent with COPD. No acute infiltrate, pleural effusion or pneumothorax. Old LEFT rib fractures. Osseous demineralization. IMPRESSION: COPD changes without acute infiltrate. Post CABG. Electronically Signed   By: Lavonia Dana M.D.   On: 07/07/2020 12:44   DG Chest 2 View  Result Date: 02/02/2022 CLINICAL DATA:  Chronic cough, former smoker EXAM: CHEST - 2 VIEW COMPARISON:  None Available. FINDINGS: Status post median sternotomy and CABG. Emphysema. Disc degenerative disease of the thoracic spine. IMPRESSION: 1. Emphysema without acute cardiopulmonary abnormality. 2. Status post median sternotomy and CABG. Electronically Signed   By: Delanna Ahmadi M.D.   On: 02/02/2022 10:33     Assessment & Plan:    Eschol was seen today for annual exam, hypertension and cough.  Diagnoses and all orders for this visit:  Essential hypertension- His blood pressure is well controlled. -     Basic metabolic panel; Future -  Hepatic function panel; Future -     TSH; Future -     Urinalysis, Routine w reflex microscopic; Future -     EKG 12-Lead  Stage 3a chronic kidney disease ([place])- I will monitor his renal function. -     Basic metabolic panel; Future -     Urinalysis, Routine w reflex microscopic; Future  Pure hypercholesterolemia- LDL goal achieved. Doing well on the statin  -     Lipid panel; Future -     Hepatic function panel; Future -     TSH; Future  Prediabetes -     Hemoglobin A1c; Future  Other folate deficiency anemias- His H/H are normal now. -     Vitamin B12; Future -     CBC with Differential/Platelet; Future  Encounter for general adult medical examination with abnormal findings-exam completed, labs reviewed, vaccines reviewed-he refused a flu vaccine, cancer screenings addressed, patient education was given.  Bradycardia- He is asymptomatic. -     EKG 12-Lead  Chronic cough- Will treat for chronic bronchitis with emphysema. -     DG Chest 2 View; Future  Screening for prostate cancer -     PSA; Future  Onychomycosis of great toe -     Ambulatory referral to Podiatry  Pulmonary emphysema, unspecified emphysema type ([place]) -     revefenacin (YUPELRI) 175 MCG/3ML nebulizer solution; Take 3 mLs (175 mcg total) by nebulization daily.  Tobacco abuse -     Ambulatory Referral for Lung Cancer Scre  H    I have discontinued Yehudah M. Lariviere's carvedilol and lisinopril. I am also having him start on revefenacin. Additionally, I am having him maintain his aspirin EC, atorvastatin, amLODipine, and folic acid.  Meds ordered this encounter  Medications   revefenacin (YUPELRI) 175 MCG/3ML nebulizer solution    Sig: Take 3 mLs (175 mcg total) by nebulization daily.     Dispense:  90 mL    Refill:  1     Follow-up: No follow-ups on file.  Scarlette Calico, MD

## 2022-02-02 ENCOUNTER — Telehealth: Payer: Self-pay

## 2022-02-02 NOTE — Telephone Encounter (Signed)
Pt will be coming on Monday 11/6 to have labs drawn.

## 2022-02-02 NOTE — Telephone Encounter (Signed)
-----   Message from Janith Lima, MD sent at 02/01/2022  3:59 PM EDT ----- Regarding: labs Ask him to come back to do his labs

## 2022-02-05 ENCOUNTER — Other Ambulatory Visit (INDEPENDENT_AMBULATORY_CARE_PROVIDER_SITE_OTHER): Payer: Medicare HMO

## 2022-02-05 DIAGNOSIS — E7800 Pure hypercholesterolemia, unspecified: Secondary | ICD-10-CM | POA: Diagnosis not present

## 2022-02-05 DIAGNOSIS — R7303 Prediabetes: Secondary | ICD-10-CM | POA: Diagnosis not present

## 2022-02-05 DIAGNOSIS — Z125 Encounter for screening for malignant neoplasm of prostate: Secondary | ICD-10-CM

## 2022-02-05 DIAGNOSIS — D528 Other folate deficiency anemias: Secondary | ICD-10-CM

## 2022-02-05 DIAGNOSIS — I10 Essential (primary) hypertension: Secondary | ICD-10-CM | POA: Diagnosis not present

## 2022-02-05 DIAGNOSIS — N1831 Chronic kidney disease, stage 3a: Secondary | ICD-10-CM | POA: Diagnosis not present

## 2022-02-05 LAB — CBC WITH DIFFERENTIAL/PLATELET
Basophils Absolute: 0 10*3/uL (ref 0.0–0.1)
Basophils Relative: 0.6 % (ref 0.0–3.0)
Eosinophils Absolute: 0.1 10*3/uL (ref 0.0–0.7)
Eosinophils Relative: 1.6 % (ref 0.0–5.0)
HCT: 40.1 % (ref 39.0–52.0)
Hemoglobin: 13.4 g/dL (ref 13.0–17.0)
Lymphocytes Relative: 22.2 % (ref 12.0–46.0)
Lymphs Abs: 1 10*3/uL (ref 0.7–4.0)
MCHC: 33.4 g/dL (ref 30.0–36.0)
MCV: 93.8 fl (ref 78.0–100.0)
Monocytes Absolute: 0.3 10*3/uL (ref 0.1–1.0)
Monocytes Relative: 7.4 % (ref 3.0–12.0)
Neutro Abs: 3.1 10*3/uL (ref 1.4–7.7)
Neutrophils Relative %: 68.2 % (ref 43.0–77.0)
Platelets: 198 10*3/uL (ref 150.0–400.0)
RBC: 4.27 Mil/uL (ref 4.22–5.81)
RDW: 14.5 % (ref 11.5–15.5)
WBC: 4.6 10*3/uL (ref 4.0–10.5)

## 2022-02-05 LAB — HEPATIC FUNCTION PANEL
ALT: 15 U/L (ref 0–53)
AST: 19 U/L (ref 0–37)
Albumin: 3.9 g/dL (ref 3.5–5.2)
Alkaline Phosphatase: 56 U/L (ref 39–117)
Bilirubin, Direct: 0.2 mg/dL (ref 0.0–0.3)
Total Bilirubin: 0.6 mg/dL (ref 0.2–1.2)
Total Protein: 6.5 g/dL (ref 6.0–8.3)

## 2022-02-05 LAB — HEMOGLOBIN A1C: Hgb A1c MFr Bld: 6.2 % (ref 4.6–6.5)

## 2022-02-05 LAB — LIPID PANEL
Cholesterol: 147 mg/dL (ref 0–200)
HDL: 78.2 mg/dL (ref >39.00)
LDL Cholesterol: 60 mg/dL (ref 0–99)
NonHDL: 69.22
Total CHOL/HDL Ratio: 2
Triglycerides: 44 mg/dL (ref 0.0–149.0)
VLDL: 8.8 mg/dL (ref 0.0–40.0)

## 2022-02-05 LAB — PSA: PSA: 0.69 ng/mL (ref 0.10–4.00)

## 2022-02-05 LAB — TSH: TSH: 0.96 u[IU]/mL (ref 0.35–5.50)

## 2022-02-06 ENCOUNTER — Encounter: Payer: Self-pay | Admitting: Internal Medicine

## 2022-02-06 DIAGNOSIS — Z720 Tobacco use: Secondary | ICD-10-CM | POA: Insufficient documentation

## 2022-02-06 LAB — URINALYSIS, ROUTINE W REFLEX MICROSCOPIC
Bilirubin Urine: NEGATIVE
Hgb urine dipstick: NEGATIVE
Leukocytes,Ua: NEGATIVE
Nitrite: NEGATIVE
RBC / HPF: NONE SEEN (ref 0–?)
Specific Gravity, Urine: >=1.03 — AB (ref 1.000–1.030)
Total Protein, Urine: NEGATIVE
Urine Glucose: 100 — AB
Urobilinogen, UA: 0.2 (ref 0.0–1.0)
pH: 5.5 (ref 5.0–8.0)

## 2022-02-06 MED ORDER — REVEFENACIN 175 MCG/3ML IN SOLN: 175.0000 ug | Freq: Every day | RESPIRATORY_TRACT | 1 refills | Status: AC

## 2022-02-07 ENCOUNTER — Other Ambulatory Visit: Payer: Self-pay | Admitting: *Deleted

## 2022-02-07 ENCOUNTER — Telehealth: Payer: Self-pay | Admitting: Acute Care

## 2022-02-07 DIAGNOSIS — Z122 Encounter for screening for malignant neoplasm of respiratory organs: Secondary | ICD-10-CM

## 2022-02-07 DIAGNOSIS — F17210 Nicotine dependence, cigarettes, uncomplicated: Secondary | ICD-10-CM

## 2022-02-07 DIAGNOSIS — Z87891 Personal history of nicotine dependence: Secondary | ICD-10-CM

## 2022-02-07 NOTE — Telephone Encounter (Signed)
Spoke with patient Scheduled SDMV 02/27/22 1:30 CT scheduled 02/28/22 2:30 Pt voiced understanding and had no further questions.

## 2022-02-07 NOTE — Telephone Encounter (Signed)
Received a call from [facility] in regards to needing to schedule an appt for pt to have lung cancer screening visit. Referred by PCP. Routing to lung nodule pool.

## 2022-02-08 ENCOUNTER — Telehealth: Payer: Self-pay | Admitting: Internal Medicine

## 2022-02-08 NOTE — Telephone Encounter (Signed)
Left message for patient to call back to schedule Medicare Annual Wellness Visit   Last AWV  01/27/21  Please schedule at anytime with [facility] if patient calls the office back.      Any questions, please call me at [phone]

## 2022-02-27 ENCOUNTER — Encounter: Payer: Self-pay | Admitting: Primary Care

## 2022-02-27 ENCOUNTER — Ambulatory Visit (INDEPENDENT_AMBULATORY_CARE_PROVIDER_SITE_OTHER): Payer: Medicare HMO | Admitting: Primary Care

## 2022-02-27 DIAGNOSIS — Z720 Tobacco use: Secondary | ICD-10-CM

## 2022-02-27 DIAGNOSIS — F17210 Nicotine dependence, cigarettes, uncomplicated: Secondary | ICD-10-CM | POA: Diagnosis not present

## 2022-02-27 NOTE — Patient Instructions (Signed)
Thank you for participating in the [facility]. It was our pleasure to meet you today. We will call you with the results of your scan within the next few days. Your scan will be assigned a Lung RADS category score by the physicians reading the scans.  This Lung RADS score determines follow up scanning.  See below for description of categories, and follow up screening recommendations. We will be in touch to schedule your follow up screening annually or based on recommendations of our providers. We will fax a copy of your scan results to your Primary Care Physician, or the physician who referred you to the program, to ensure they have the results. Please call the office if you have any questions or concerns regarding your scanning experience or results.  Our office number is [phone]. Please speak with Denise [name], RN. , or  Denise Buckner RN, They are  our Lung Cancer Screening RN.'s If They are unavailable when you call, Please leave a message on the voice mail. We will return your call at our earliest convenience.This voice mail is monitored several times a day.  Remember, if your scan is normal, we will scan you annually as long as you continue to meet the criteria for the program. (Age 55-77, Current smoker or smoker who has quit within the last 15 years). If you are a smoker, remember, quitting is the single most powerful action that you can take to decrease your risk of lung cancer and other pulmonary, breathing related problems. We know quitting is hard, and we are here to help.  Please let us know if there is anything we can do to help you meet your goal of quitting. If you are a former smoker, congratulations. We are proud of you! Remain smoke free! Remember you can refer friends or family members through the number above.  We will screen them to make sure they meet criteria for the program. Thank you for helping us take better care of you by  participating in Lung Screening.  You can receive free nicotine replacement therapy ( patches, gum or mints) by calling 1-800-QUIT NOW. Please call so we can get you on the path to becoming  a non-smoker. I know it is hard, but you can do this!  Lung RADS Categories:  Lung RADS 1: no nodules or definitely non-concerning nodules.  Recommendation is for a repeat annual scan in 12 months.  Lung RADS 2:  nodules that are non-concerning in appearance and behavior with a very low likelihood of becoming an active cancer. Recommendation is for a repeat annual scan in 12 months.  Lung RADS 3: nodules that are probably non-concerning , includes nodules with a low likelihood of becoming an active cancer.  Recommendation is for a 6-month repeat screening scan. Often noted after an upper respiratory illness. We will be in touch to make sure you have no questions, and to schedule your 6-month scan.  Lung RADS 4 A: nodules with concerning findings, recommendation is most often for a follow up scan in 3 months or additional testing based on our provider's assessment of the scan. We will be in touch to make sure you have no questions and to schedule the recommended 3 month follow up scan.  Lung RADS 4 B:  indicates findings that are concerning. We will be in touch with you to schedule additional diagnostic testing based on our provider's  assessment of the scan.  Other options for assistance in smoking cessation (   As covered by your insurance benefits)  Hypnosis for smoking cessation  [employer] [phone]  Acupuncture for smoking cessation  [facility] [phone]   

## 2022-02-27 NOTE — Progress Notes (Addendum)
Virtual Visit via Telephone Note  I connected with Fleet Contras on 02/27/22 at  1:30 PM EST by telephone and verified that I am speaking with the correct person using two identifiers.  Location: Patient: Home Provider: Office    I discussed the limitations, risks, security and privacy concerns of performing an evaluation and management service by telephone and the availability of in person appointments. I also discussed with the patient that there may be a patient responsible charge related to this service. The patient expressed understanding and agreed to proceed.   Shared Decision Making Visit Lung Cancer Screening Program [phone])   Eligibility: Age 76 y.o. Pack Years Smoking History Calculation 20 (# packs/per year x # years smoked) Recent History of coughing up blood  no Unexplained weight loss? no ( >Than 15 pounds within the last 6 months ) Prior History Lung / other cancer no (Diagnosis within the last 5 years already requiring surveillance chest CT Scans). Smoking Status Current Smoker Former Smokers: Years since quit: NA  Quit Date: NA  Visit Components: Discussion included one or more decision making aids. yes Discussion included risk/benefits of screening. yes Discussion included potential follow up diagnostic testing for abnormal scans. yes Discussion included meaning and risk of over diagnosis. yes Discussion included meaning and risk of False Positives. yes Discussion included meaning of total radiation exposure. yes  Counseling Included: Importance of adherence to annual lung cancer LDCT screening. yes Impact of comorbidities on ability to participate in the program. yes Ability and willingness to under diagnostic treatment. yes  Smoking Cessation Counseling: Current Smokers:  Discussed importance of smoking cessation. yes Information about tobacco cessation classes and interventions provided to patient. yes Patient provided with "ticket" for LDCT Scan.  NA Symptomatic Patient. no  Counseling(Intermediate counseling: > three minutes) 99406 Diagnosis Code: Tobacco Use Z72.0 Asymptomatic Patient yes  Counseling (Intermediate counseling: > three minutes counseling) [id number] Former Smokers:  Discussed the importance of maintaining cigarette abstinence. yes Diagnosis Code: Personal History of Nicotine Dependence. K02.542 Information about tobacco cessation classes and interventions provided to patient. Yes Patient provided with "ticket" for LDCT Scan. NA Written Order for Lung Cancer Screening with LDCT placed in Epic. Yes (CT Chest Lung Cancer Screening Low Dose W/O CM) [id number] Z12.2-Screening of respiratory organs Z87.891-Personal history of nicotine dependence  I have spent 25 minutes of face to face/ virtual visit   time with Mr Coberly discussing the risks and benefits of lung cancer screening. We viewed / discussed a power point together that explained in detail the above noted topics. We paused at intervals to allow for questions to be asked and answered to ensure understanding.We discussed that the single most powerful action that he can take to decrease his risk of developing lung cancer is to quit smoking. We discussed whether or not he is ready to commit to setting a quit date. We discussed options for tools to aid in quitting smoking including nicotine replacement therapy, non-nicotine medications, support groups, Quit Smart classes, and behavior modification. We discussed that often times setting smaller, more achievable goals, such as eliminating 1 cigarette a day for a week and then 2 cigarettes a day for a week can be helpful in slowly decreasing the number of cigarettes smoked. This allows for a sense of accomplishment as well as providing a clinical benefit. I provided  him with smoking cessation  information  with contact information for community resources, classes, free nicotine replacement therapy, and access to mobile apps, text  messaging, and  on-line smoking cessation help. I have also provided him  the office contact information in the event he needs to contact me, or the screening staff. We discussed the time and location of the scan, and that either Doroteo Glassman RN, Joella Prince, RN  or I will call / send a letter with the results within 24-72 hours of receiving them. The patient verbalized understanding of all of  the above and had no further questions upon leaving the office. They have my contact information in the event they have any further questions.  I spent 3-5 minutes counseling on smoking cessation and the health risks of continued tobacco abuse.  I explained to the patient that there has been a high incidence of coronary artery disease noted on these exams. I explained that this is a non-gated exam therefore degree or severity cannot be determined. This patient is on statin therapy. I have asked the patient to follow-up with their PCP regarding any incidental finding of coronary artery disease and management with diet or medication as their PCP  feels is clinically indicated. The patient verbalized understanding of the above and had no further questions upon completion of the visit.   Current light smoker, he smokes 2 cigarettes a day. Verified he is taking Lipitor as prescribed. Not using Yupelri nebulizer.   Martyn Ehrich, NP

## 2022-02-28 ENCOUNTER — Encounter (HOSPITAL_COMMUNITY): Payer: Self-pay

## 2022-02-28 ENCOUNTER — Telehealth: Payer: Self-pay | Admitting: Acute Care

## 2022-02-28 ENCOUNTER — Ambulatory Visit (HOSPITAL_COMMUNITY)
Admission: RE | Admit: 2022-02-28 | Discharge: 2022-02-28 | Disposition: A | Discharge status: Home / Self Care | Payer: Medicare HMO | Source: Ambulatory Visit | Attending: Acute Care | Admitting: Acute Care

## 2022-02-28 DIAGNOSIS — Z122 Encounter for screening for malignant neoplasm of respiratory organs: Secondary | ICD-10-CM

## 2022-02-28 DIAGNOSIS — Z87891 Personal history of nicotine dependence: Secondary | ICD-10-CM

## 2022-02-28 DIAGNOSIS — F17210 Nicotine dependence, cigarettes, uncomplicated: Secondary | ICD-10-CM

## 2022-02-28 NOTE — Telephone Encounter (Signed)
Returned a call to [facility] CT after receiving a message that patient did not meet criteria for LDCT.  Spoke with Judeen Hammans who said when they completed the form for pack year history, it did not meet the 20 pack year criteria.  At the sdmv and pre-screening questionnaire for pulmonary, the patient did meet pack year history.  Eric Form NP discussed how pulmonary screens patient and that she would contact the radiologist to see what we can do to make sure patients are not being cancelled.  She also directed the RT staff to our notes on the sdmv if they needed to verify the patient history information. They do not routinely go into the patient chart encounters but will discuss with their radiologist as well.  Patient had left for another appointment and LDCT has been cancelled at this time.

## 2022-03-29 ENCOUNTER — Inpatient Hospital Stay (HOSPITAL_COMMUNITY): Payer: Medicare HMO

## 2022-03-29 ENCOUNTER — Inpatient Hospital Stay (HOSPITAL_COMMUNITY)
Admission: EM | Admit: 2022-03-29 | Discharge: 2022-04-02 | DRG: 917 | Disposition: E | Payer: Medicare HMO | Attending: Internal Medicine | Admitting: Internal Medicine

## 2022-03-29 ENCOUNTER — Emergency Department (HOSPITAL_COMMUNITY): Payer: Medicare HMO

## 2022-03-29 ENCOUNTER — Encounter (HOSPITAL_COMMUNITY): Payer: Self-pay

## 2022-03-29 DIAGNOSIS — I4891 Unspecified atrial fibrillation: Secondary | ICD-10-CM | POA: Diagnosis present

## 2022-03-29 DIAGNOSIS — G9341 Metabolic encephalopathy: Secondary | ICD-10-CM | POA: Diagnosis present

## 2022-03-29 DIAGNOSIS — I469 Cardiac arrest, cause unspecified: Secondary | ICD-10-CM | POA: Diagnosis present

## 2022-03-29 DIAGNOSIS — Z888 Allergy status to other drugs, medicaments and biological substances status: Secondary | ICD-10-CM

## 2022-03-29 DIAGNOSIS — R4182 Altered mental status, unspecified: Secondary | ICD-10-CM

## 2022-03-29 DIAGNOSIS — N179 Acute kidney failure, unspecified: Secondary | ICD-10-CM | POA: Diagnosis present

## 2022-03-29 DIAGNOSIS — A419 Sepsis, unspecified organism: Secondary | ICD-10-CM | POA: Diagnosis present

## 2022-03-29 DIAGNOSIS — Z801 Family history of malignant neoplasm of trachea, bronchus and lung: Secondary | ICD-10-CM

## 2022-03-29 DIAGNOSIS — J9601 Acute respiratory failure with hypoxia: Secondary | ICD-10-CM | POA: Diagnosis present

## 2022-03-29 DIAGNOSIS — Z7982 Long term (current) use of aspirin: Secondary | ICD-10-CM | POA: Diagnosis not present

## 2022-03-29 DIAGNOSIS — Z825 Family history of asthma and other chronic lower respiratory diseases: Secondary | ICD-10-CM

## 2022-03-29 DIAGNOSIS — Z79899 Other long term (current) drug therapy: Secondary | ICD-10-CM | POA: Diagnosis not present

## 2022-03-29 DIAGNOSIS — F17210 Nicotine dependence, cigarettes, uncomplicated: Secondary | ICD-10-CM | POA: Diagnosis present

## 2022-03-29 DIAGNOSIS — I2510 Atherosclerotic heart disease of native coronary artery without angina pectoris: Secondary | ICD-10-CM | POA: Diagnosis present

## 2022-03-29 DIAGNOSIS — K631 Perforation of intestine (nontraumatic): Secondary | ICD-10-CM | POA: Diagnosis present

## 2022-03-29 DIAGNOSIS — Z8249 Family history of ischemic heart disease and other diseases of the circulatory system: Secondary | ICD-10-CM

## 2022-03-29 DIAGNOSIS — J439 Emphysema, unspecified: Secondary | ICD-10-CM | POA: Diagnosis present

## 2022-03-29 DIAGNOSIS — T405X1A Poisoning by cocaine, accidental (unintentional), initial encounter: Principal | ICD-10-CM | POA: Diagnosis present

## 2022-03-29 DIAGNOSIS — J449 Chronic obstructive pulmonary disease, unspecified: Secondary | ICD-10-CM | POA: Diagnosis present

## 2022-03-29 DIAGNOSIS — Z8041 Family history of malignant neoplasm of ovary: Secondary | ICD-10-CM

## 2022-03-29 DIAGNOSIS — E162 Hypoglycemia, unspecified: Secondary | ICD-10-CM | POA: Diagnosis present

## 2022-03-29 DIAGNOSIS — E7800 Pure hypercholesterolemia, unspecified: Secondary | ICD-10-CM | POA: Diagnosis present

## 2022-03-29 DIAGNOSIS — I428 Other cardiomyopathies: Secondary | ICD-10-CM | POA: Diagnosis not present

## 2022-03-29 DIAGNOSIS — I129 Hypertensive chronic kidney disease with stage 1 through stage 4 chronic kidney disease, or unspecified chronic kidney disease: Secondary | ICD-10-CM | POA: Diagnosis present

## 2022-03-29 DIAGNOSIS — Z66 Do not resuscitate: Secondary | ICD-10-CM | POA: Diagnosis present

## 2022-03-29 DIAGNOSIS — Z85038 Personal history of other malignant neoplasm of large intestine: Secondary | ICD-10-CM | POA: Diagnosis not present

## 2022-03-29 DIAGNOSIS — Z951 Presence of aortocoronary bypass graft: Secondary | ICD-10-CM | POA: Diagnosis not present

## 2022-03-29 DIAGNOSIS — I4510 Unspecified right bundle-branch block: Secondary | ICD-10-CM | POA: Diagnosis present

## 2022-03-29 DIAGNOSIS — E872 Acidosis, unspecified: Secondary | ICD-10-CM | POA: Diagnosis present

## 2022-03-29 DIAGNOSIS — E875 Hyperkalemia: Secondary | ICD-10-CM | POA: Diagnosis present

## 2022-03-29 DIAGNOSIS — R6521 Severe sepsis with septic shock: Secondary | ICD-10-CM | POA: Diagnosis present

## 2022-03-29 DIAGNOSIS — I468 Cardiac arrest due to other underlying condition: Secondary | ICD-10-CM | POA: Diagnosis present

## 2022-03-29 DIAGNOSIS — N1831 Chronic kidney disease, stage 3a: Secondary | ICD-10-CM | POA: Diagnosis present

## 2022-03-29 DIAGNOSIS — Z800 Family history of malignant neoplasm of digestive organs: Secondary | ICD-10-CM

## 2022-03-29 LAB — CBC
HCT: 44.4 % (ref 39.0–52.0)
Hemoglobin: 13.8 g/dL (ref 13.0–17.0)
MCH: 31.8 pg (ref 26.0–34.0)
MCHC: 31.1 g/dL (ref 30.0–36.0)
MCV: 102.3 fL — ABNORMAL HIGH (ref 80.0–100.0)
Platelets: 182 10*3/uL (ref 150–400)
RBC: 4.34 MIL/uL (ref 4.22–5.81)
RDW: 13.2 % (ref 11.5–15.5)
WBC: 7 10*3/uL (ref 4.0–10.5)
nRBC: 0.9 % — ABNORMAL HIGH (ref 0.0–0.2)

## 2022-03-29 LAB — I-STAT CHEM 8, ED
BUN: 44 mg/dL — ABNORMAL HIGH (ref 8–23)
Calcium, Ion: 0.9 mmol/L — ABNORMAL LOW (ref 1.15–1.40)
Chloride: 101 mmol/L (ref 98–111)
Creatinine, Ser: 4.7 mg/dL — ABNORMAL HIGH (ref 0.61–1.24)
Glucose, Bld: 117 mg/dL — ABNORMAL HIGH (ref 70–99)
HCT: 45 % (ref 39.0–52.0)
Hemoglobin: 15.3 g/dL (ref 13.0–17.0)
Potassium: 6.2 mmol/L — ABNORMAL HIGH (ref 3.5–5.1)
Sodium: 133 mmol/L — ABNORMAL LOW (ref 135–145)
TCO2: 17 mmol/L — ABNORMAL LOW (ref 22–32)

## 2022-03-29 LAB — URINALYSIS, ROUTINE W REFLEX MICROSCOPIC
Bilirubin Urine: NEGATIVE
Glucose, UA: 50 mg/dL — AB
Hgb urine dipstick: NEGATIVE
Ketones, ur: NEGATIVE mg/dL
Leukocytes,Ua: NEGATIVE
Nitrite: NEGATIVE
Protein, ur: 100 mg/dL — AB
Specific Gravity, Urine: 1.02 (ref 1.005–1.030)
pH: 5 (ref 5.0–8.0)

## 2022-03-29 LAB — RAPID URINE DRUG SCREEN, HOSP PERFORMED
Amphetamines: NOT DETECTED
Barbiturates: NOT DETECTED
Benzodiazepines: NOT DETECTED
Cocaine: POSITIVE — AB
Opiates: NOT DETECTED
Tetrahydrocannabinol: NOT DETECTED

## 2022-03-29 LAB — I-STAT ARTERIAL BLOOD GAS, ED
Acid-base deficit: 26 mmol/L — ABNORMAL HIGH (ref 0.0–2.0)
Acid-base deficit: 21 mmol/L — ABNORMAL HIGH (ref 0.0–2.0)
Bicarbonate: 9.3 mmol/L — ABNORMAL LOW (ref 20.0–28.0)
Bicarbonate: 10.3 mmol/L — ABNORMAL LOW (ref 20.0–28.0)
Calcium, Ion: 1.26 mmol/L (ref 1.15–1.40)
Calcium, Ion: 1.01 mmol/L — ABNORMAL LOW (ref 1.15–1.40)
HCT: 36 % — ABNORMAL LOW (ref 39.0–52.0)
HCT: 27 % — ABNORMAL LOW (ref 39.0–52.0)
Hemoglobin: 12.2 g/dL — ABNORMAL LOW (ref 13.0–17.0)
Hemoglobin: 9.2 g/dL — ABNORMAL LOW (ref 13.0–17.0)
O2 Saturation: 99 %
O2 Saturation: 99 %
Patient temperature: 94.8
Patient temperature: 92.4
Potassium: 6.3 mmol/L (ref 3.5–5.1)
Potassium: 6.7 mmol/L (ref 3.5–5.1)
Sodium: 134 mmol/L — ABNORMAL LOW (ref 135–145)
Sodium: 133 mmol/L — ABNORMAL LOW (ref 135–145)
TCO2: 11 mmol/L — ABNORMAL LOW (ref 22–32)
TCO2: 12 mmol/L — ABNORMAL LOW (ref 22–32)
pCO2 arterial: 55.4 mmHg — ABNORMAL HIGH (ref 32–48)
pCO2 arterial: 42.2 mmHg (ref 32–48)
pH, Arterial: 6.816 — CL (ref 7.35–7.45)
pH, Arterial: 6.972 — CL (ref 7.35–7.45)
pO2, Arterial: 296 mmHg — ABNORMAL HIGH (ref 83–108)
pO2, Arterial: 216 mmHg — ABNORMAL HIGH (ref 83–108)

## 2022-03-29 LAB — PHOSPHORUS: Phosphorus: >30 mg/dL — ABNORMAL HIGH (ref 2.5–4.6)

## 2022-03-29 LAB — ECHOCARDIOGRAM COMPLETE
Area-P 1/2: 5.84 cm2
Calc EF: 23 %
Height: 72 in
S' Lateral: 4 cm
Single Plane A2C EF: 23.2 %
Single Plane A4C EF: 19.2 %
Weight: 2610.25 oz

## 2022-03-29 LAB — BLOOD GAS, ARTERIAL
Acid-base deficit: 25.5 mmol/L — ABNORMAL HIGH (ref 0.0–2.0)
Bicarbonate: 7.7 mmol/L — ABNORMAL LOW (ref 20.0–28.0)
O2 Saturation: 94.4 %
Patient temperature: 37
pCO2 arterial: 42 mmHg (ref 32–48)
pH, Arterial: <6.95 — CL (ref 7.35–7.45)
pO2, Arterial: 90 mmHg (ref 83–108)

## 2022-03-29 LAB — COMPREHENSIVE METABOLIC PANEL
ALT: 1474 U/L — ABNORMAL HIGH (ref 0–44)
AST: 1351 U/L — ABNORMAL HIGH (ref 15–41)
Albumin: 2.1 g/dL — ABNORMAL LOW (ref 3.5–5.0)
Alkaline Phosphatase: 47 U/L (ref 38–126)
Anion gap: 27 — ABNORMAL HIGH (ref 5–15)
BUN: 31 mg/dL — ABNORMAL HIGH (ref 8–23)
CO2: 10 mmol/L — ABNORMAL LOW (ref 22–32)
Calcium: 8.4 mg/dL — ABNORMAL LOW (ref 8.9–10.3)
Chloride: 104 mmol/L (ref 98–111)
Creatinine, Ser: 4.17 mg/dL — ABNORMAL HIGH (ref 0.61–1.24)
GFR, Estimated: 14 mL/min — ABNORMAL LOW (ref >60)
Glucose, Bld: 82 mg/dL (ref 70–99)
Potassium: 6.2 mmol/L — ABNORMAL HIGH (ref 3.5–5.1)
Sodium: 141 mmol/L (ref 135–145)
Total Bilirubin: 1.3 mg/dL — ABNORMAL HIGH (ref 0.3–1.2)
Total Protein: 4.5 g/dL — ABNORMAL LOW (ref 6.5–8.1)

## 2022-03-29 LAB — BASIC METABOLIC PANEL
Anion gap: 28 — ABNORMAL HIGH (ref 5–15)
BUN: 32 mg/dL — ABNORMAL HIGH (ref 8–23)
CO2: 9 mmol/L — ABNORMAL LOW (ref 22–32)
Calcium: 7.8 mg/dL — ABNORMAL LOW (ref 8.9–10.3)
Chloride: 99 mmol/L (ref 98–111)
Creatinine, Ser: 3.97 mg/dL — ABNORMAL HIGH (ref 0.61–1.24)
GFR, Estimated: 15 mL/min — ABNORMAL LOW (ref >60)
Glucose, Bld: 160 mg/dL — ABNORMAL HIGH (ref 70–99)
Potassium: 6.7 mmol/L (ref 3.5–5.1)
Sodium: 136 mmol/L (ref 135–145)

## 2022-03-29 LAB — TROPONIN I (HIGH SENSITIVITY)
Troponin I (High Sensitivity): 284 ng/L (ref <18)
Troponin I (High Sensitivity): 945 ng/L (ref <18)

## 2022-03-29 LAB — LACTIC ACID, PLASMA
Lactic Acid, Venous: >9 mmol/L (ref 0.5–1.9)
Lactic Acid, Venous: >9 mmol/L (ref 0.5–1.9)

## 2022-03-29 LAB — GLUCOSE, CAPILLARY: Glucose-Capillary: 208 mg/dL — ABNORMAL HIGH (ref 70–99)

## 2022-03-29 LAB — PROCALCITONIN: Procalcitonin: >150 ng/mL

## 2022-03-29 LAB — CBG MONITORING, ED
Glucose-Capillary: 120 mg/dL — ABNORMAL HIGH (ref 70–99)
Glucose-Capillary: 153 mg/dL — ABNORMAL HIGH (ref 70–99)

## 2022-03-29 LAB — MAGNESIUM: Magnesium: 2.7 mg/dL — ABNORMAL HIGH (ref 1.7–2.4)

## 2022-03-29 LAB — PROTIME-INR
INR: 1.9 — ABNORMAL HIGH (ref 0.8–1.2)
Prothrombin Time: 22 seconds — ABNORMAL HIGH (ref 11.4–15.2)

## 2022-03-29 LAB — TYPE AND SCREEN
ABO/RH(D): O POS
Antibody Screen: NEGATIVE

## 2022-03-29 LAB — TRIGLYCERIDES: Triglycerides: 98 mg/dL (ref <150)

## 2022-03-29 LAB — MRSA NEXT GEN BY PCR, NASAL: MRSA by PCR Next Gen: NOT DETECTED

## 2022-03-29 LAB — APTT: aPTT: 70 seconds — ABNORMAL HIGH (ref 24–36)

## 2022-03-29 MED ORDER — SODIUM BICARBONATE 8.4 % IV SOLN
300.0000 meq | Freq: Once | INTRAVENOUS | Status: DC
  Filled 2022-03-29: qty 300

## 2022-03-29 MED ORDER — SODIUM BICARBONATE 8.4 % IV SOLN
INTRAVENOUS | Status: AC
  Administered 2022-03-29: 100 meq via INTRAVENOUS
  Filled 2022-03-29: qty 100

## 2022-03-29 MED ORDER — CALCIUM CHLORIDE 10 % IV SOLN
INTRAVENOUS | Status: AC
  Administered 2022-03-29: 1000 mg
  Filled 2022-03-29: qty 10

## 2022-03-29 MED ORDER — SODIUM BICARBONATE 8.4 % IV SOLN
Freq: Once | INTRAVENOUS | Status: AC
  Filled 2022-03-29: qty 1000

## 2022-03-29 MED ORDER — FENTANYL BOLUS VIA INFUSION: 50.0000 ug | INTRAVENOUS | Status: DC | PRN

## 2022-03-29 MED ORDER — SODIUM BICARBONATE 8.4 % IV SOLN
100.0000 meq | Freq: Once | INTRAVENOUS | Status: AC
  Filled 2022-03-29: qty 100

## 2022-03-29 MED ORDER — HYDROCORTISONE SOD SUC (PF) 100 MG IJ SOLR
100.0000 mg | Freq: Three times a day (TID) | INTRAMUSCULAR | Status: DC
  Administered 2022-03-29: 100 mg via INTRAVENOUS
  Filled 2022-03-29: qty 2

## 2022-03-29 MED ORDER — MAGNESIUM SULFATE 2 GM/50ML IV SOLN: 2.0000 g | Freq: Once | INTRAVENOUS | Status: DC | PRN

## 2022-03-29 MED ORDER — ACETAMINOPHEN 325 MG PO TABS: 650.0000 mg | ORAL_TABLET | ORAL | Status: DC

## 2022-03-29 MED ORDER — FAMOTIDINE 20 MG PO TABS
20.0000 mg | ORAL_TABLET | Freq: Every day | ORAL | Status: DC
  Administered 2022-03-29: 20 mg
  Filled 2022-03-29: qty 1

## 2022-03-29 MED ORDER — ONDANSETRON HCL 4 MG/2ML IJ SOLN: 4.0000 mg | Freq: Four times a day (QID) | INTRAMUSCULAR | Status: DC | PRN

## 2022-03-29 MED ORDER — SODIUM CHLORIDE 0.9 % IV SOLN
1000.0000 mL | INTRAVENOUS | Status: DC
  Administered 2022-03-29: 1000 mL via INTRAVENOUS

## 2022-03-29 MED ORDER — POLYETHYLENE GLYCOL 3350 17 G PO PACK: 17.0000 g | PACK | Freq: Every day | ORAL | Status: DC

## 2022-03-29 MED ORDER — ACETAMINOPHEN 325 MG PO TABS: 650.0000 mg | ORAL_TABLET | ORAL | Status: DC | PRN

## 2022-03-29 MED ORDER — NOREPINEPHRINE 16 MG/250ML-% IV SOLN
5.0000 ug/min | INTRAVENOUS | Status: DC
  Administered 2022-03-29: 100 ug/min via INTRAVENOUS
  Administered 2022-03-29: 90 ug/min via INTRAVENOUS
  Filled 2022-03-29 (×2): qty 250

## 2022-03-29 MED ORDER — ARFORMOTEROL TARTRATE 15 MCG/2ML IN NEBU
15.0000 ug | INHALATION_SOLUTION | Freq: Two times a day (BID) | RESPIRATORY_TRACT | Status: DC
  Administered 2022-03-29: 15 ug via RESPIRATORY_TRACT
  Filled 2022-03-29: qty 2

## 2022-03-29 MED ORDER — SODIUM CHLORIDE 0.9 % IV BOLUS
1000.0000 mL | Freq: Once | INTRAVENOUS | Status: AC
  Administered 2022-03-29: 1000 mL via INTRAVENOUS

## 2022-03-29 MED ORDER — INSULIN ASPART 100 UNIT/ML IV SOLN
10.0000 [IU] | Freq: Once | INTRAVENOUS | Status: AC
  Administered 2022-03-29: 10 [IU] via INTRAVENOUS

## 2022-03-29 MED ORDER — PIPERACILLIN-TAZOBACTAM IN DEX 2-0.25 GM/50ML IV SOLN
2.2500 g | Freq: Four times a day (QID) | INTRAVENOUS | Status: DC
  Administered 2022-03-29: 2.25 g via INTRAVENOUS
  Filled 2022-03-29 (×4): qty 50

## 2022-03-29 MED ORDER — SODIUM CHLORIDE 0.9 % IV BOLUS (SEPSIS)
1000.0000 mL | Freq: Once | INTRAVENOUS | Status: AC
  Administered 2022-03-29: 1000 mL via INTRAVENOUS

## 2022-03-29 MED ORDER — ACETAMINOPHEN 650 MG RE SUPP: 650.0000 mg | RECTAL | Status: DC

## 2022-03-29 MED ORDER — ROCURONIUM BROMIDE 50 MG/5ML IV SOLN: 100.0000 mg | Freq: Once | INTRAVENOUS | Status: AC

## 2022-03-29 MED ORDER — FENTANYL CITRATE PF 50 MCG/ML IJ SOSY
50.0000 ug | PREFILLED_SYRINGE | Freq: Once | INTRAMUSCULAR | Status: AC
  Administered 2022-03-29: 50 ug via INTRAVENOUS
  Filled 2022-03-29: qty 1

## 2022-03-29 MED ORDER — BUSPIRONE HCL 10 MG PO TABS: 30.0000 mg | ORAL_TABLET | Freq: Three times a day (TID) | ORAL | Status: DC | PRN

## 2022-03-29 MED ORDER — PROPOFOL 1000 MG/100ML IV EMUL: 0.0000 ug/kg/min | INTRAVENOUS | Status: DC

## 2022-03-29 MED ORDER — VASOPRESSIN 20 UNITS/100 ML INFUSION FOR SHOCK
0.0400 [IU]/min | INTRAVENOUS | Status: DC
  Administered 2022-03-29: 0.04 [IU]/min via INTRAVENOUS
  Filled 2022-03-29: qty 100

## 2022-03-29 MED ORDER — DEXTROSE 50 % IV SOLN
1.0000 | Freq: Once | INTRAVENOUS | Status: AC
  Administered 2022-03-29: 50 mL via INTRAVENOUS
  Filled 2022-03-29: qty 50

## 2022-03-29 MED ORDER — DOCUSATE SODIUM 50 MG/5ML PO LIQD: 100.0000 mg | Freq: Two times a day (BID) | ORAL | Status: DC

## 2022-03-29 MED ORDER — SODIUM BICARBONATE 8.4 % IV SOLN
100.0000 meq | INTRAVENOUS | Status: AC
  Administered 2022-03-29 (×3): 100 meq via INTRAVENOUS
  Filled 2022-03-29 (×2): qty 150

## 2022-03-29 MED ORDER — REVEFENACIN 175 MCG/3ML IN SOLN
175.0000 ug | Freq: Every day | RESPIRATORY_TRACT | Status: DC
  Filled 2022-03-29: qty 3

## 2022-03-29 MED ORDER — EPINEPHRINE HCL 5 MG/250ML IV SOLN IN NS
0.5000 ug/min | INTRAVENOUS | Status: DC
  Administered 2022-03-29: 0.5 ug/min via INTRAVENOUS
  Filled 2022-03-29: qty 250

## 2022-03-29 MED ORDER — CALCIUM GLUCONATE-NACL 1-0.675 GM/50ML-% IV SOLN
1.0000 g | Freq: Once | INTRAVENOUS | Status: AC
  Administered 2022-03-29: 1000 mg via INTRAVENOUS
  Filled 2022-03-29: qty 50

## 2022-03-29 MED ORDER — NOREPINEPHRINE 4 MG/250ML-% IV SOLN
5.0000 ug/min | INTRAVENOUS | Status: DC
  Administered 2022-03-29 (×3): 80 ug/min via INTRAVENOUS
  Administered 2022-03-29: 5 ug/min via INTRAVENOUS
  Filled 2022-03-29 (×5): qty 250

## 2022-03-29 MED ORDER — SODIUM CHLORIDE 0.9 % IV SOLN: 2.0000 g | INTRAVENOUS | Status: DC

## 2022-03-29 MED ORDER — INSULIN ASPART 100 UNIT/ML IJ SOLN
0.0000 [IU] | INTRAMUSCULAR | Status: DC
  Administered 2022-03-29: 5 [IU] via SUBCUTANEOUS

## 2022-03-29 MED ORDER — ACETAMINOPHEN 160 MG/5ML PO SOLN
650.0000 mg | ORAL | Status: DC
  Administered 2022-03-29: 650 mg
  Filled 2022-03-29: qty 20.3

## 2022-03-29 MED ORDER — ROCURONIUM BROMIDE 10 MG/ML (PF) SYRINGE
PREFILLED_SYRINGE | INTRAVENOUS | Status: AC
  Administered 2022-03-29: 100 mg via INTRAVENOUS
  Filled 2022-03-29: qty 10

## 2022-03-29 MED ORDER — SODIUM CHLORIDE 0.9 % IV SOLN
250.0000 mL | INTRAVENOUS | Status: DC
  Administered 2022-03-29: 500 mL via INTRAVENOUS

## 2022-03-29 MED ORDER — FENTANYL 2500MCG IN NS 250ML (10MCG/ML) PREMIX INFUSION
50.0000 ug/h | INTRAVENOUS | Status: DC
  Administered 2022-03-29: 50 ug/h via INTRAVENOUS
  Filled 2022-03-29: qty 250

## 2022-03-29 MED ORDER — ACETAMINOPHEN 650 MG RE SUPP: 650.0000 mg | RECTAL | Status: DC | PRN

## 2022-03-29 MED ORDER — ACETAMINOPHEN 160 MG/5ML PO SOLN: 650.0000 mg | ORAL | Status: DC | PRN

## 2022-03-30 LAB — POCT I-STAT 7, (LYTES, BLD GAS, ICA,H+H)
Acid-base deficit: 25 mmol/L — ABNORMAL HIGH (ref 0.0–2.0)
Bicarbonate: 7.5 mmol/L — ABNORMAL LOW (ref 20.0–28.0)
Calcium, Ion: 0.93 mmol/L — ABNORMAL LOW (ref 1.15–1.40)
HCT: 32 % — ABNORMAL LOW (ref 39.0–52.0)
Hemoglobin: 10.9 g/dL — ABNORMAL LOW (ref 13.0–17.0)
O2 Saturation: 85 %
Patient temperature: 33.2
Potassium: 7 mmol/L (ref 3.5–5.1)
Sodium: 127 mmol/L — ABNORMAL LOW (ref 135–145)
TCO2: 9 mmol/L — ABNORMAL LOW (ref 22–32)
pCO2 arterial: 33.7 mmHg (ref 32–48)
pH, Arterial: 6.924 — CL (ref 7.35–7.45)
pO2, Arterial: 67 mmHg — ABNORMAL LOW (ref 83–108)

## 2022-03-31 LAB — URINE CULTURE: Culture: NO GROWTH
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
## 2022-04-02 NOTE — ED Notes (Signed)
Trauma Event Note    TRN assisted with resuscitation.   Last imported Vital Signs BP (!) 65/55 (BP Location: Left Arm)   Pulse (!) 108   Temp (!) 95 F (35 C) (Other (Comment))   Resp 18   Ht 6' (1.829 m)   SpO2 100%   BMI 22.24 kg/m   Trending CBC Recent Labs    04/04/2022 0604 04-Apr-2022 0620  WBC  --  7.0  HGB 15.3 13.8  HCT 45.0 44.4  PLT  --  182    Trending Coag's Recent Labs    April 04, 2022 0620  APTT 70*  INR 1.9*    Trending BMET Recent Labs    2022-04-04 0604  NA 133*  K 6.2*  CL 101  BUN 44*  CREATININE 4.70*  GLUCOSE 117*      Javeion Cannedy O Amyra Vantuyl  Trauma Response RN  Please call TRN at [phone] for further assistance.

## 2022-04-02 NOTE — Progress Notes (Signed)
Pt transported from TRA A to 2H25 without any complications.

## 2022-04-02 NOTE — ED Notes (Signed)
Provider informed of low BP. Epi drip to be ordered

## 2022-04-02 NOTE — Progress Notes (Signed)
SBP 30's, slow ventricular escape rhythm noted on monitor which progressess to asystole. Cardiac death verified with second RN at 1540. Physician notified and he will notify pt's family.

## 2022-04-02 NOTE — Code Documentation (Signed)
Calcium 1 gm administered.

## 2022-04-02 NOTE — Progress Notes (Signed)
Still unstable Refractory acidemia CT Abd results and CCS input noted.  Will see if can turn around in next few hours with bicarb, pressors, vent, abx etc.  If recurrent arrest family agrees that he would want natural death.  Additional 65 mins coordinating care, speaking with family etc.  Erskine Emery MD PCCM

## 2022-04-02 NOTE — Code Documentation (Addendum)
Epi x 1 administered.

## 2022-04-02 NOTE — Progress Notes (Signed)
New ABG results relayed to Dr Tamala Julian via secure chat. See EMR for orders received. Remains hemodynamically unstable. Warming blanket is on. Titrating pressors as BP requires.

## 2022-04-02 NOTE — Code Documentation (Signed)
Atropine administered.

## 2022-04-02 NOTE — Progress Notes (Signed)
Attempted Echocardiogram, Art line currently in progress.

## 2022-04-02 NOTE — ED Provider Notes (Signed)
[place] EMERGENCY DEPARTMENT Provider Note  CSN: [id number] Arrival date & time: 04-05-2022 0544  Chief Complaint(s) Cardiac Arrest  HPI Christian Black is a 77 y.o. male with a past medical history listed below including polysubstance use disorder, stage III CKD here as a cardiac arrest.  Per EMS, the team was called out earlier in the night due to patient feeling unwell and having severe nausea and vomiting.  At that time patient declined being transported to emergency department.  Around 5 AM this morning, they were called out again after patient had collapsed.  When they arrived patient was  pulseless, apneic.  ACLS was initiated.  Airway secured with ET tube, patient received a total of 6 rounds of epinephrine. Noted to be hypoglycemic in 50. Improved after D50.  After 20 minutes ROSC was obtained.  The history is provided by the EMS personnel.    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   Encounter for general adult medical examination with abnormal findings 10/15/2020   Prediabetes 10/12/2020   Screening for prostate cancer 10/12/2020   Postoperative atrial fibrillation ([place]) 09/13/2020   Abnormal CT of brain 07/07/2020   Polysubstance abuse ([place]) 07/07/2020   Pure hypercholesterolemia 03/22/2020   Malnutrition of moderate degree 06/04/2016   CAD (coronary artery disease), native coronary artery  05/30/2016   Abnormal stress test    Essential hypertension 05/23/2016   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 Authorizing Provider  amLODipine (NORVASC) 5 MG tablet Take 1 tablet by mouth once daily 11/20/21   Skeet Latch, MD  aspirin EC 81 MG tablet Take 81 mg by mouth daily.    [provider]  atorvastatin (LIPITOR) 80 MG tablet Take 1 tablet by mouth once daily 11/20/21   Skeet Latch, MD  folic acid (FOLVITE) 1 MG tablet Take 1 tablet (1 mg total) by mouth daily. 01/24/22   Janith Lima, MD  revefenacin (YUPELRI) 175 MCG/3ML nebulizer solution Take 3 mLs (175 mcg total) by nebulization daily. Patient not taking: Reported on 02/27/2022 02/06/22   Janith Lima, MD  Allergies Lisinopril  Review of Systems Review of Systems As noted in HPI  Physical Exam Vital Signs  I have reviewed the triage vital signs BP (!) 87/62   Pulse 99   Temp (!) 94.3 F (34.6 C)   Resp 19   Ht 6' (1.829 m)   SpO2 99%   BMI 22.24 kg/m   Physical Exam Vitals reviewed.  Constitutional:      General: He is in acute distress.     Appearance: He is well-developed. He is toxic-appearing.     Interventions: He is intubated.  HENT:     Head: Normocephalic and atraumatic.     Nose: Nose normal.  Eyes:     General: No scleral icterus.       Right eye: No discharge.        Left eye: No discharge.     Conjunctiva/sclera: Conjunctivae normal.  Cardiovascular:     Rate and Rhythm: Normal rate and regular rhythm.     Heart sounds: No murmur heard.    No friction rub. No gallop.  Pulmonary:     Effort: Pulmonary effort is normal. No respiratory distress. He is intubated.      Breath sounds: Normal breath sounds. No stridor. No rales.  Abdominal:     General: There is no distension.     Palpations: Abdomen is soft.     Tenderness: There is no abdominal tenderness.  Musculoskeletal:        General: No tenderness.     Cervical back: Normal range of motion and neck supple.       Legs:  Skin:    General: Skin is warm and dry.     Findings: No erythema or rash.  Neurological:     Mental Status: He is oriented to person, place, and time.     ED Results and Treatments Labs (all labs ordered are listed, but only abnormal results are displayed) Labs Reviewed  CBG MONITORING, ED - Abnormal; Notable for the following components:      Result Value   Glucose-Capillary 120 (*)    All other components within normal limits  I-STAT CHEM 8, ED - Abnormal; Notable for the following components:   Sodium 133 (*)    Potassium 6.2 (*)    BUN 44 (*)    Creatinine, Ser 4.70 (*)    Glucose, Bld 117 (*)    Calcium, Ion 0.90 (*)    TCO2 17 (*)    All other components within normal limits  CULTURE, BLOOD (ROUTINE X 2)  CULTURE, BLOOD (ROUTINE X 2)  URINE CULTURE  CULTURE, RESPIRATORY W GRAM STAIN  BLOOD GAS, ARTERIAL  COMPREHENSIVE METABOLIC PANEL  LACTIC ACID, PLASMA  LACTIC ACID, PLASMA  LACTIC ACID, PLASMA  LACTIC ACID, PLASMA  CBC  APTT  PROTIME-INR  MAGNESIUM  PHOSPHORUS  RAPID URINE DRUG SCREEN, HOSP PERFORMED  PROCALCITONIN  TRIGLYCERIDES  URINALYSIS, ROUTINE W REFLEX MICROSCOPIC  I-STAT CHEM 8, ED  CBG MONITORING, ED  TYPE AND SCREEN  TROPONIN I (HIGH SENSITIVITY)  EKG  EKG Interpretation  Date/Time:  04/23/2022 06:01:41 EST Ventricular Rate:  119 PR Interval:    QRS Duration: 164 QT Interval:  371 QTC Calculation: 522 R Axis:   70 Text Interpretation: Junctional tachycardia Right bundle branch block  Confirmed by Addison Lank [phone]) on April 23, 2022 6:25:38 AM       Radiology No results found.  Medications Ordered in ED Medications  calcium gluconate 1 g/ 50 mL sodium chloride IVPB (has no administration in time range)  norepinephrine (LEVOPHED) '4mg'$  in 224m (0.016 mg/mL) premix infusion (has no administration in time range)  calcium chloride 10 % injection (1,000 mg  Given [id number]                                                                                                                                     Procedures Ultrasound ED Peripheral IV (Provider)  Date/Time: 101-22-246:30 AM  Performed by: CFatima Blank MD Authorized by: CFatima Blank MD   Procedure details:    Indications: multiple failed IV attempts     Skin Prep: chlorhexidine gluconate     Location:  Right AC   Angiocath:  20 G   Bedside Ultrasound Guided: Yes     Images: not archived     Patient tolerated procedure without complications: Yes     Dressing applied: Yes   .Critical Care  Performed by: CFatima Blank MD Authorized by: CFatima Blank MD   Critical care provider statement:    Critical care time (minutes):  80   Critical care time was exclusive of:  Separately billable procedures and treating other patients   Critical care was necessary to treat or prevent imminent or life-threatening deterioration of the following conditions:  Cardiac failure   Critical care was time spent personally by me on the following activities:  Development of treatment plan with patient or surrogate, discussions with consultants, evaluation of patient's response to treatment, examination of patient, obtaining history from patient or surrogate, review of old charts, re-evaluation of patient's condition, pulse oximetry, ordering and review of radiographic studies, ordering and review of laboratory studies, ordering and performing treatments and interventions, transcutaneous  pacing, vascular access procedures and ventilator management   Care discussed with: admitting provider     (including critical care time)  Medical Decision Making / ED Course   Medical Decision Making Amount and/or Complexity of Data Reviewed Labs: ordered. Decision-making details documented in ED Course. Radiology: ordered and independent interpretation performed. Decision-making details documented in ED Course. ECG/medicine tests: ordered and independent interpretation performed. Decision-making details documented in ED Course.  Risk OTC drugs. Prescription drug management. Drug therapy requiring intensive monitoring for toxicity. Decision regarding hospitalization.    Cardiac arrest. Unknown etiology. Pulses intact upon arrival.  Shortly after arrival, patient became bradycardic and ACLS was initiated again. Short transcutaneous pacing started Given Epi, atropine,and bicarb. ROSC was obtained after  2 rounds of ACLS.  Spoke with family and updated.  I-STAT Chem-8 was notable for AKI with a creatinine of 4.7 up from 1.4 one year ago.  Potassium 6.2. Just this is resulted, patient coded again.  After 1 round of ACLS ROSC was obtained.  Patient was given calcium gluconate.  Patient started on Levophed drip.  CBC without leukocytosis or anemia. Formal metabolic panel pending. ABG notable for metabolic acidosis Lactic acid greater than 9 UDS positive for cocaine UA negative for infection Chest x-ray without evidence of pneumonia, pneumothorax.  Good ET tube placement.  Critical care consulted for admission.      Final Clinical Impression(s) / ED Diagnoses Final diagnoses:  Cardiac arrest [facility])           This chart was dictated using voice recognition software.  Despite best efforts to proofread,  errors can occur which can change the documentation meaning.    Fatima Blank, MD 2022-04-11 [phone]

## 2022-04-02 NOTE — Progress Notes (Signed)
    Attempted Echocardiogram, Art line currently in progress.

## 2022-04-02 NOTE — Procedures (Signed)
Arterial Catheter Insertion Procedure Note Christian Black [id number] 10/31/1945  Procedure: Insertion of Arterial Catheter  Indications: Blood pressure monitoring and Frequent blood sampling  Procedure Details Consent: Unable to obtain consent because of emergent medical necessity. Time Out: Verified patient identification, verified procedure, site/side was marked, verified correct patient position, special equipment/implants available, medications/allergies/relevent history reviewed, required imaging and test results available.  Performed  Maximum sterile technique was used including antiseptics, cap, gloves, gown, hand hygiene, mask, and sheet. Skin prep: Chlorhexidine; local anesthetic administered 20 gauge catheter was inserted into right femoral artery using the Seldinger technique.  Evaluation Blood flow good; BP tracing good. Complications: No apparent complications.   Richardson Landry Aliviyah Malanga ACNP Maryanna Shape PCCM Pager [phone] till 3 pm If no answer page [phone] 04-07-2022, 8:55 AM

## 2022-04-02 NOTE — Progress Notes (Signed)
EEG complete - results pending 

## 2022-04-02 NOTE — Progress Notes (Signed)
Family at bedside. Updated on condition and current plan. Dr Tamala Julian notified and after speaking with them, code status is updated. Clinical status unchanged.

## 2022-04-02 NOTE — Progress Notes (Signed)
Becoming increasingly hypotensive despite increased pressor doses. Temp now 34.7. MAP 54. Dr Tamala Julian updated as well as patient's daughter. They are currently not on site but request additional call with any changes.

## 2022-04-02 NOTE — Progress Notes (Signed)
Echocardiogram 2D Echocardiogram has been performed.  Oneal Deputy Langston Summerfield RDCS 2022/04/03, 10:31 AM  Dr. Harriet Masson notified of stat

## 2022-04-02 NOTE — Procedures (Signed)
Central Venous Catheter Insertion Procedure Note Christian Black [id number] 02/29/1946  Procedure: Insertion of Central Venous Catheter Indications: Assessment of intravascular volume, Drug and/or fluid administration, and Frequent blood sampling  Procedure Details Consent: Unable to obtain consent because of emergent medical necessity. Time Out: Verified patient identification, verified procedure, site/side was marked, verified correct patient position, special equipment/implants available, medications/allergies/relevent history reviewed, required imaging and test results available.  Performed  Maximum sterile technique was used including antiseptics, cap, gloves, gown, hand hygiene, mask, and sheet. Skin prep: Chlorhexidine; local anesthetic administered A antimicrobial bonded/coated triple lumen catheter was placed in the left internal jugular vein using the Seldinger technique. Ultrasound guidance used.Yes.   Catheter placed to 20 cm. Blood aspirated via all 3 ports and then flushed x 3. Line sutured x 2 and dressing applied.  Evaluation Blood flow good Complications: No apparent complications Patient did tolerate procedure well. Chest X-ray ordered to verify placement.  CXR: normal.  Richardson Landry Leydi Winstead ACNP Acute Care Nurse Practitioner [place] Please consult Amion 15-Apr-2022, 8:53 AM

## 2022-04-02 NOTE — Progress Notes (Signed)
Received from ED and bedside updates received from [facility]. Levo, epi, and fentanyl infusing. SBP 80-100. Titrating upward on vasopressors as needed. Fentanyl dose decreased to 81mgs due to hypotension. Bladder temp is 32.9. See flowsheet for full assessment.

## 2022-04-02 NOTE — Code Documentation (Signed)
Family updated as to patient's status.

## 2022-04-02 NOTE — ED Triage Notes (Signed)
Pt arrives [facility] from home. EMS responded to pt's house earlier in the night for a routine sick call. Pt refused to be transported to the hospital at that time.   Upon EMS arrival the second time, pt was pulseless and apneic with an initial rhythm of PEA. CPR initiated. Epi x6 administered. Initial glucose 56. D10 administered. Repeat glucose 80.

## 2022-04-02 NOTE — Progress Notes (Signed)
Pharmacy Antibiotic Note  Christian Black is a 77 y.o. male admitted on Apr 26, 2022 with sepsis after a cardiac arrest.  Pharmacy has been consulted for Zosyn dosing.  CrCl currently 10-20 mL/min. UOP not documented.  Plan: Zosyn 2.25g Q6H given AKI Monitor renal function for dose adjustments  Height: 6' (182.9 cm) Weight: 74 kg (163 lb 2.3 oz) IBW/kg (Calculated) : 77.6  Temp (24hrs), Avg:94.7 F (34.8 C), Min:94.3 F (34.6 C), Max:95.1 F (35.1 C)  Recent Labs  Lab 04-26-22 0604 2022/04/26 0620  WBC  --  7.0  CREATININE 4.70* 4.17*  LATICACIDVEN  --  >9.0*    Estimated Creatinine Clearance: 16.3 mL/min (A) (by C-G formula based on SCr of 4.17 mg/dL (H)).    Allergies  Allergen Reactions   Lisinopril Cough    Antimicrobials this admission: Zosyn 12/28 >>  Microbiology results: 12/28 Bcx: ordered  Thank you for allowing pharmacy to be a part of this patient's care.  Merrilee Jansky, PharmD Clinical Pharmacist 2022-04-26 8:37 AM

## 2022-04-02 NOTE — Progress Notes (Signed)
Pt transported to CT1 and back to Trauma A on the vent w/o any complications.

## 2022-04-02 NOTE — Procedures (Signed)
Patient Name: Christian Black  MRN: [id number]  Epilepsy Attending: Lora Havens  Referring Physician/Provider: Candee Furbish, MD  Date: 04/12/2022 Duration: 23.27 mins  Patient history: 77yo M s/p cardiac arrest. EEG to evaluate for seizure  Level of alertness:  comatose  AEDs during EEG study: Propofol  Technical aspects: This EEG study was done with scalp electrodes positioned according to the 10-20 International system of electrode placement. Electrical activity was reviewed with band pass filter of 1-'70Hz'$ , sensitivity of 7 uV/mm, display speed of 77m/sec with a '60Hz'$  notched filter applied as appropriate. EEG data were recorded continuously and digitally stored.  Video monitoring was available and reviewed as appropriate.  Description: EEG showed continuous generalized predominantly 5 to 9 Hz theta-alpha activity admixed with intermittent 2 to 3 Hz generalized delta slowing. Hyperventilation and photic stimulation were not performed.     ABNORMALITY - Continuous slow, generalized  IMPRESSION: This study is suggestive of severe diffuse encephalopathy, nonspecific etiology. No seizures or epileptiform discharges were seen throughout the recording.  Alura Olveda OBarbra Sarks

## 2022-04-02 NOTE — Code Documentation (Addendum)
Compressions initiated. PEA

## 2022-04-02 NOTE — Code Documentation (Signed)
1 amp of Bicarb administered.

## 2022-04-02 NOTE — Consult Note (Signed)
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 atherosclerotic calcifications are present in the aorta and branch vessels. No aneurysm is present. No significant retroperitoneal adenopathy is present. Reproductive: Prostate is enlarged, measuring 5.8 cm in transverse diameter. Other: No significant ventral hernia is present. Diffuse abdominal ascites and pneumoperitoneum as above. Musculoskeletal: Degenerative changes are again noted in the lower lumbar spine. No acute abnormalities are present. Focal osseous lesions present. Degenerative changes of the hips are worse right than left. IMPRESSION: 1. Diffuse abdominal ascites and pneumoperitoneum compatible with bowel perforation. Scattered locules of gas are present in the left abdomen which may be near the source. A definitive source of the gas is not  identified. 2. Multiple non or minimally displaced rib fractures anteriorly and laterally both sides. 3. No pneumothorax 4. Small pleural effusions, left  greater than right. 5. Wall thickening at the fundus the gallbladder may reflect acute inflammation. No evidence for scratched at no gas at this site to suggest necrosis or source of the pneumoperitoneum. 6. Aortic Atherosclerosis (ICD10-I70.0) and Emphysema (ICD10-J43.9). 7. Dense coronary artery calcifications. Critical Value/emergent results were called by telephone at the time of interpretation on 04/23/2022 at 9:39 am to provider [facility] , who verbally acknowledged these results. Electronically Signed   By: San Morelle M.D.   On: 04-23-22 09:52   CT HEAD WO CONTRAST  Result Date: Apr 23, 2022 CLINICAL DATA:  Post cardiac arrest and sepsis. Mental status change EXAM: CT HEAD WITHOUT CONTRAST TECHNIQUE: Contiguous axial images were obtained from the base of the skull through the vertex without intravenous contrast. RADIATION DOSE REDUCTION: This exam was performed according to the departmental dose-optimization program which includes automated exposure control, adjustment of the mA and/or kV according to patient size and/or use of iterative reconstruction technique. COMPARISON:  CT head 07/07/2020 FINDINGS: Brain: Mild atrophy. Mild to moderate white matter hypodensity bilaterally unchanged. Chronic infarct right thalamus unchanged. Negative for acute infarct, hemorrhage, or mass. Vascular: Negative for hyperdense vessel Skull: Negative Sinuses/Orbits: Air-fluid levels in the maxillary and sphenoid sinus bilaterally. Mild mucosal edema elsewhere. Mastoid clear bilaterally. Cerumen in the external auditory canal bilaterally. Other: None IMPRESSION: 1. Atrophy and chronic microvascular ischemia. No acute intracranial abnormality. 2. Air-fluid levels in the paranasal sinuses. Electronically Signed   By: Franchot Gallo M.D.   On: 2022-04-23 09:23    DG Chest Portable 1 View  Result Date: 04-23-2022 CLINICAL DATA:  Central line placement in a 77 year old male. EXAM: PORTABLE CHEST 1 VIEW COMPARISON:  March 21, 2022 FINDINGS: Endotracheal tube terminating in mid trachea, tip just below clavicular heads. Gastric tube coursing through in off the field of the radiograph. Pacer pads over the LEFT chest. EKG leads project over the chest. Interval placement of a LEFT IJ central venous access device which terminates in the low SVC area. Cardiomediastinal contours and hilar structures are stable. No signs of consolidation. Mild interstitial prominence at the lung bases.  No pneumothorax. On limited assessment no acute skeletal process. IMPRESSION: 1. Interval placement of a LEFT IJ central venous access device which terminates in the area of the low SVC. 2. No pneumothorax. 3. Mild interstitial prominence at the lung bases. Some of this may be chronic and accentuated by low lung volumes. Correlate with any signs of volume overload or heart failure. Electronically Signed   By: Zetta Bills M.D.   On: April 23, 2022 08:27   DG Abd Portable 1V  Result Date: 2022-04-23 CLINICAL DATA:  NG tube placement. EXAM: PORTABLE ABDOMEN - 1 VIEW COMPARISON:  None Available. FINDINGS: The NG tube tip is in the fundal region the stomach. The proximal port is in the distal esophagus just above the GE junction. This could be advanced several cm. IMPRESSION: NG tube tip is in the fundal region of the stomach. This could be advanced several cm. Electronically Signed   By: Marijo Sanes M.D.   On: 04-23-2022 07:08   DG Chest Portable 1 View  Result Date: Apr 23, 2022 CLINICAL DATA:  Intubated. EXAM: PORTABLE CHEST 1 VIEW COMPARISON:  01/30/2022 FINDINGS: The endotracheal tube is 6 cm above the carina. The NG tube is coursing down the esophagus. External pacer paddles are noted. 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 Emphysematous changes and pulmonary scarring. No  infiltrates or effusions or pulmonary edema. Remote posttraumatic changes involving the left hemithorax  with multiple healed rib fractures. IMPRESSION: 1. Endotracheal tube and NG tubes in good position. 2. Emphysematous changes and pulmonary scarring but no acute pulmonary findings. Electronically Signed   By: Marijo Sanes M.D.   On: 2022/04/24 06:59      Assessment/Plan PEA arrest  Suspected aspiration  Hx of HTN COPD CKD Cocaine abuse  Pneumoperitoneum and free abdominal fluid on CT - small volume pneumoperitoneum not unexpected s/p arrest however free fluid and hx of recent GI symptoms concerning for possible bowel perforation - patient is not currently stable enough for the OR and currently overall prognosis seems dismal - if patient becomes more stable and aggressive care desired would recommend repeat CT AP with PO contrast to evaluate for perforation  ID - Zosyn VTE - SCDs FEN - NPO, NGT to LIWS   I reviewed last 24 h vitals and pain scores, last 48 h intake and output, last 24 h labs and trends, last 24 h imaging results, and PCCM notes .   Barkley Boards, [facility] 24-Apr-2022, 11:24 AM Please see Amion for pager number during day hours 7:00am-4:30pm

## 2022-04-02 DEATH — deceased

## 2022-04-03 LAB — CULTURE, BLOOD (ROUTINE X 2)
Culture: NO GROWTH
Culture: NO GROWTH
Special Requests: ADEQUATE
Special Requests: ADEQUATE

## 2022-05-02 ENCOUNTER — Ambulatory Visit: Payer: Medicare HMO | Admitting: Internal Medicine

## 2022-05-03 NOTE — Death Summary Note (Signed)
DEATH SUMMARY   Patient Details  Name: Christian Black MRN: [id number] DOB: 1945/12/19  Admission/Discharge Information   Admit Date:  2022/04/05  Date of Death: Date of Death: 2022/04/05  Time of Death: Time of Death: 45  Length of Stay: 1  Referring Physician: Janith Lima, MD    Diagnoses   OHCA in context of polysubstance abuse including cocaine, AKI on CKD, hyperkalemia  Pneumoperitoneum- either perforated viscus or sequelae of CPR   Post arrest profound shock, encephalopathy, acidemia, resp failure   Question aspiration   Hx HTN, COPD   Brief Hospital Course (including significant findings, care, treatment, and services provided and events leading to death)  OHCA PEA initial rhythm Preceding N/V Per triage:    Pt arrives [facility] from home. EMS responded to pt's house earlier in the night for a routine sick call. Pt refused to be transported to the hospital at that time.    Upon EMS arrival the second time, pt was pulseless and apneic with an initial rhythm of PEA. CPR initiated. Epi x6 administered. Initial glucose 56. D10 administered. Repeat glucose 80.    Hx CKD, polysubstance abuse Notable labs pH 6.8, high K, high lactate, cocaine in urine   Given usual temporizing measures  Imaging revealed pneumoperitoneum.  Despite aggressive resuscitation, patient's acidemia and shock could not be reversed, family came to bedside to say goodbye.  Pertinent Labs and Studies  Significant Diagnostic Studies ECHOCARDIOGRAM COMPLETE  Result Date: 04/05/22    ECHOCARDIOGRAM REPORT   Patient Name:   Christian Black Date of Exam: 2022-04-05 Medical Rec #:  [id number]      Height:       72.0 in Accession #:    [id number]     Weight:       163.1 lb Date of Birth:  March 02, 1946     BSA:          1.954 m Patient Age:    77 years       BP:           110/58 mmHg Patient Gender: M              HR:           90 bpm. Exam Location:  Inpatient Procedure: 2D Echo, Color Doppler and  Cardiac Doppler STAT ECHO Indications:    I42.9 Cardiomyopathy (unspecified)  History:        Patient has prior history of Echocardiogram examinations, most                 recent 07/09/2020. CAD, Prior CABG, COPD, Arrythmias:Atrial                 Fibrillation; Risk Factors:Hypertension, Dyslipidemia and                 Polysubstance Abuse.  Sonographer:    Raquel Sarna Senior RDCS Referring Phys: [id number] PEDRO EDUARDO CARDAMA  Sonographer Comments: Scanned supine on artificial respirator. IMPRESSIONS  1. Left ventricular ejection fraction, by estimation, is 20 to 25%. The left ventricle has severely decreased function. The left ventricle demonstrates regional wall motion abnormalities (see scoring diagram/findings for description). Left ventricular diastolic parameters are indeterminate.  2. Right ventricular systolic function is mildly reduced. The right ventricular size is normal. Tricuspid regurgitation signal is inadequate for assessing PA pressure.  3. The mitral valve is normal in structure. No evidence of mitral valve regurgitation. No evidence of mitral stenosis.  4. The aortic valve is normal in structure.  Aortic valve regurgitation is not visualized. No aortic stenosis is present.  5. The inferior vena cava is normal in size with greater than 50% respiratory variability, suggesting right atrial pressure of 3 mmHg. FINDINGS  Left Ventricle: Left ventricular ejection fraction, by estimation, is 20 to 25%. The left ventricle has severely decreased function. The left ventricle demonstrates regional wall motion abnormalities. The left ventricular internal cavity size was normal  in size. There is no left ventricular hypertrophy. Left ventricular diastolic parameters are indeterminate.  LV Wall Scoring: The inferior septum and entire inferior wall are akinetic. The entire anterior wall, entire lateral wall, anterior septum, and apex are hypokinetic. Right Ventricle: The right ventricular size is normal. No increase in  right ventricular wall thickness. Right ventricular systolic function is mildly reduced. Tricuspid regurgitation signal is inadequate for assessing PA pressure. Left Atrium: Left atrial size was normal in size. Right Atrium: Right atrial size was normal in size. Pericardium: There is no evidence of pericardial effusion. Presence of epicardial fat layer. Mitral Valve: The mitral valve is normal in structure. No evidence of mitral valve regurgitation. No evidence of mitral valve stenosis. Tricuspid Valve: The tricuspid valve is normal in structure. Tricuspid valve regurgitation is not demonstrated. No evidence of tricuspid stenosis. Aortic Valve: The aortic valve is normal in structure. Aortic valve regurgitation is not visualized. No aortic stenosis is present. Pulmonic Valve: The pulmonic valve was normal in structure. Pulmonic valve regurgitation is not visualized. No evidence of pulmonic stenosis. Aorta: The aortic root is normal in size and structure. Venous: The inferior vena cava is normal in size with greater than 50% respiratory variability, suggesting right atrial pressure of 3 mmHg. IAS/Shunts: No atrial level shunt detected by color flow Doppler.  LEFT VENTRICLE PLAX 2D LVIDd:         4.80 cm      Diastology LVIDs:         4.00 cm      LV e' medial:    5.98 cm/s LV PW:         0.80 cm      LV E/e' medial:  13.5 LV IVS:        1.00 cm      LV e' lateral:   5.77 cm/s LVOT diam:     2.40 cm      LV E/e' lateral: 14.0 LV SV:         29 LV SV Index:   15 LVOT Area:     4.52 cm  LV Volumes (MOD) LV vol d, MOD A2C: 103.0 ml LV vol d, MOD A4C: 107.0 ml LV vol s, MOD A2C: 79.1 ml LV vol s, MOD A4C: 86.5 ml LV SV MOD A2C:     23.9 ml LV SV MOD A4C:     107.0 ml LV SV MOD BP:      24.4 ml RIGHT VENTRICLE RV S prime:     6.85 cm/s TAPSE (M-mode): 1.3 cm LEFT ATRIUM             Index        RIGHT ATRIUM           Index LA diam:        2.60 cm 1.33 cm/m   RA Area:     13.70 cm LA Vol (A2C):   49.1 ml 25.13 ml/m  RA  Volume:   34.10 ml  17.45 ml/m LA Vol (A4C):   25.0 ml 12.80 ml/m LA Biplane Vol: 37.6  ml 19.24 ml/m  AORTIC VALVE LVOT Vmax:   52.30 cm/s LVOT Vmean:  36.400 cm/s LVOT VTI:    0.064 m  AORTA Ao Root diam: 3.60 cm Ao Asc diam:  2.70 cm MITRAL VALVE MV Area (PHT): 5.84 cm    SHUNTS MV Decel Time: 130 msec    Systemic VTI:  0.06 m MV E velocity: 80.70 cm/s  Systemic Diam: 2.40 cm MV A velocity: 38.80 cm/s MV E/A ratio:  2.08 Kardie Tobb DO Electronically signed by Berniece Salines DO Signature Date/Time: 04-24-22/11:18:02 AM    Final    CT CHEST ABDOMEN PELVIS WO CONTRAST  Result Date: 04/24/22 CLINICAL DATA:  Sepsis.  Cardiac arrest. EXAM: CT CHEST, ABDOMEN AND PELVIS WITHOUT CONTRAST TECHNIQUE: Multidetector CT imaging of the chest, abdomen and pelvis was performed following the standard protocol without IV contrast. RADIATION DOSE REDUCTION: This exam was performed according to the departmental dose-optimization program which includes automated exposure control, adjustment of the mA and/or kV according to patient size and/or use of iterative reconstruction technique. COMPARISON:  One-view chest x-ray in one view abdomen 04-24-22. FINDINGS: CT CHEST FINDINGS Cardiovascular: Heart size is within normal limits. Dense coronary artery calcifications are present. More mild calcifications are present at the aortic arch. Left IJ line is in place. Mediastinum/Nodes: Endotracheal tube is in satisfactory position, 4.5 cm above the carina. Gastric tube extends into the stomach. No significant mediastinal adenopathy is present. Lungs/Pleura: Centrilobular emphysematous changes are present. Dependent atelectasis is present. No pneumothorax is present. Small effusions are present, left greater than right. Musculoskeletal: Multiple non or minimally displaced rib fractures are present anteriorly and laterally both sides. Acute fractures are present in the left first through seventh ribs and right first through sixth ribs.  More remote posterior fractures are present in more inferior ribs bilaterally. Vertebral body heights are maintained. Median sternotomy noted. No acute sternal fracture is present. CT ABDOMEN PELVIS FINDINGS Hepatobiliary: Liver is unremarkable. Wall thickening is present in the fundus the gallbladder. Diffuse abdominal ascites are present limiting evaluation for focal inflammatory change. Common bile duct is within normal limits. Pancreas: Unremarkable. No pancreatic ductal dilatation or surrounding inflammatory changes. Spleen: Normal in size without focal abnormality. Adrenals/Urinary Tract: Previously noted simple cyst at the lower pole of the left kidney has increased in size. No other focal renal lesions are present. No obstruction or stone is present. The adrenal glands are normal bilaterally. A Foley catheter decompresses the urinary bladder. Stomach/Bowel: In addition to the moderate abdominal ascites, pneumoperitoneum is present. The orogastric tube terminates in the fundus the stomach. Stomach and duodenum are otherwise within normal limits. No definitive small bowel lesion or rupture is evident. Transverse colonic anastomosis noted. Locules of gas are more present adjacent bowel in the left side of the abdomen and left upper quadrant. A definitive source of the gas is not identified. No focal pneumatosis present. Vascular/Lymphatic: Dense atherosclerotic calcifications are present in the aorta and branch vessels. No aneurysm is present. No significant retroperitoneal adenopathy is present. Reproductive: Prostate is enlarged, measuring 5.8 cm in transverse diameter. Other: No significant ventral hernia is present. Diffuse abdominal ascites and pneumoperitoneum as above. Musculoskeletal: Degenerative changes are again noted in the lower lumbar spine. No acute abnormalities are present. Focal osseous lesions present. Degenerative changes of the hips are worse right than left. IMPRESSION: 1. Diffuse  abdominal ascites and pneumoperitoneum compatible with bowel perforation. Scattered locules of gas are present in the left abdomen which may be near the source. A definitive  source of the gas is not identified. 2. Multiple non or minimally displaced rib fractures anteriorly and laterally both sides. 3. No pneumothorax 4. Small pleural effusions, left greater than right. 5. Wall thickening at the fundus the gallbladder may reflect acute inflammation. No evidence for scratched at no gas at this site to suggest necrosis or source of the pneumoperitoneum. 6. Aortic Atherosclerosis (ICD10-I70.0) and Emphysema (ICD10-J43.9). 7. Dense coronary artery calcifications. Critical Value/emergent results were called by telephone at the time of interpretation on 04/02/22 at 9:39 am to provider [facility] , who verbally acknowledged these results. Electronically Signed   By: San Morelle M.D.   On: Apr 02, 2022 09:52   EEG adult  Result Date: 04/02/2022 Lora Havens, MD     Apr 02, 2022 12:29 PM Patient Name: SEQUOIA MINCEY MRN: [id number] Epilepsy Attending: Lora Havens Referring Physician/Provider: Candee Furbish, MD Date: 2022-04-02 Duration: 23.27 mins Patient history: 77yo M s/p cardiac arrest. EEG to evaluate for seizure Level of alertness:  comatose AEDs during EEG study: Propofol Technical aspects: This EEG study was done with scalp electrodes positioned according to the 10-20 International system of electrode placement. Electrical activity was reviewed with band pass filter of 1-'70Hz'$ , sensitivity of 7 uV/mm, display speed of 32m/sec with a '60Hz'$  notched filter applied as appropriate. EEG data were recorded continuously and digitally stored.  Video monitoring was available and reviewed as appropriate. Description: EEG showed continuous generalized predominantly 5 to 9 Hz theta-alpha activity admixed with intermittent 2 to 3 Hz generalized delta slowing. Hyperventilation and photic stimulation were not  performed.   ABNORMALITY - Continuous slow, generalized IMPRESSION: This study is suggestive of severe diffuse encephalopathy, nonspecific etiology. No seizures or epileptiform discharges were seen throughout the recording. PLora Havens  CT HEAD WO CONTRAST  Result Date: 101-01-24CLINICAL DATA:  Post cardiac arrest and sepsis. Mental status change EXAM: CT HEAD WITHOUT CONTRAST TECHNIQUE: Contiguous axial images were obtained from the base of the skull through the vertex without intravenous contrast. RADIATION DOSE REDUCTION: This exam was performed according to the departmental dose-optimization program which includes automated exposure control, adjustment of the mA and/or kV according to patient size and/or use of iterative reconstruction technique. COMPARISON:  CT head 07/07/2020 FINDINGS: Brain: Mild atrophy. Mild to moderate white matter hypodensity bilaterally unchanged. Chronic infarct right thalamus unchanged. Negative for acute infarct, hemorrhage, or mass. Vascular: Negative for hyperdense vessel Skull: Negative Sinuses/Orbits: Air-fluid levels in the maxillary and sphenoid sinus bilaterally. Mild mucosal edema elsewhere. Mastoid clear bilaterally. Cerumen in the external auditory canal bilaterally. Other: None IMPRESSION: 1. Atrophy and chronic microvascular ischemia. No acute intracranial abnormality. 2. Air-fluid levels in the paranasal sinuses. Electronically Signed   By: CFranchot GalloM.D.   On: 12024/04/107:23   DG Chest Portable 1 View  Result Date: 101-01-24CLINICAL DATA:  Central line placement in a 77year old male. EXAM: PORTABLE CHEST 1 VIEW COMPARISON:  March 21, 2022 FINDINGS: Endotracheal tube terminating in mid trachea, tip just below clavicular heads. Gastric tube coursing through in off the field of the radiograph. Pacer pads over the LEFT chest. EKG leads project over the chest. Interval placement of a LEFT IJ central venous access device which terminates in the  low SVC area. Cardiomediastinal contours and hilar structures are stable. No signs of consolidation. Mild interstitial prominence at the lung bases.  No pneumothorax. On limited assessment no acute skeletal process. IMPRESSION: 1. Interval placement of a LEFT IJ central venous access device which terminates  in the area of the low SVC. 2. No pneumothorax. 3. Mild interstitial prominence at the lung bases. Some of this may be chronic and accentuated by low lung volumes. Correlate with any signs of volume overload or heart failure. Electronically Signed   By: Zetta Bills M.D.   On: 2022-04-24 08:27   DG Abd Portable 1V  Result Date: 04/24/2022 CLINICAL DATA:  NG tube placement. EXAM: PORTABLE ABDOMEN - 1 VIEW COMPARISON:  None Available. FINDINGS: The NG tube tip is in the fundal region the stomach. The proximal port is in the distal esophagus just above the GE junction. This could be advanced several cm. IMPRESSION: NG tube tip is in the fundal region of the stomach. This could be advanced several cm. Electronically Signed   By: Marijo Sanes M.D.   On: 04-24-22 07:08   DG Chest Portable 1 View  Result Date: 04-24-2022 CLINICAL DATA:  Intubated. EXAM: PORTABLE CHEST 1 VIEW COMPARISON:  01/30/2022 FINDINGS: The endotracheal tube is 6 cm above the carina. The NG tube is coursing down the esophagus. External pacer paddles are noted. 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 Emphysematous changes and pulmonary scarring. No infiltrates or effusions or pulmonary edema. Remote posttraumatic changes involving the left hemithorax with multiple healed rib fractures. IMPRESSION: 1. Endotracheal tube and NG tubes in good position. 2. Emphysematous changes and pulmonary scarring but no acute pulmonary findings. Electronically Signed   By: Marijo Sanes M.D.   On: 04-24-22 06:59    Microbiology No results found for this or any previous visit (from the past 240 hour(s)).  Lab Basic Metabolic  Panel: No results for input(s): "NA", "K", "CL", "CO2", "GLUCOSE", "BUN", "CREATININE", "CALCIUM", "MG", "PHOS" in the last 168 hours. Liver Function Tests: No results for input(s): "AST", "ALT", "ALKPHOS", "BILITOT", "PROT", "ALBUMIN" in the last 168 hours. No results for input(s): "LIPASE", "AMYLASE" in the last 168 hours. No results for input(s): "AMMONIA" in the last 168 hours. CBC: No results for input(s): "WBC", "NEUTROABS", "HGB", "HCT", "MCV", "PLT" in the last 168 hours. Cardiac Enzymes: No results for input(s): "CKTOTAL", "CKMB", "CKMBINDEX", "TROPONINI" in the last 168 hours. Sepsis Labs: No results for input(s): "PROCALCITON", "WBC", "LATICACIDVEN" in the last 168 hours.   Candee Furbish 04/09/2022, 6:09 PM

## 2022-05-12 ENCOUNTER — Other Ambulatory Visit: Payer: Self-pay | Admitting: Cardiovascular Disease
